# Patient Record
Sex: Male | Born: 1955 | Race: White | Hispanic: No | Marital: Married | Smoking: Former smoker
Health system: Southern US, Community
[De-identification: ages and names within clinical notes are randomized; demographics above are authoritative.]

## PROBLEM LIST (undated history)

## (undated) DIAGNOSIS — E669 Obesity, unspecified: Secondary | ICD-10-CM

## (undated) DIAGNOSIS — K449 Diaphragmatic hernia without obstruction or gangrene: Secondary | ICD-10-CM

## (undated) DIAGNOSIS — G5600 Carpal tunnel syndrome, unspecified upper limb: Secondary | ICD-10-CM

## (undated) DIAGNOSIS — N529 Male erectile dysfunction, unspecified: Secondary | ICD-10-CM

## (undated) DIAGNOSIS — E785 Hyperlipidemia, unspecified: Secondary | ICD-10-CM

## (undated) DIAGNOSIS — E119 Type 2 diabetes mellitus without complications: Secondary | ICD-10-CM

## (undated) DIAGNOSIS — I509 Heart failure, unspecified: Secondary | ICD-10-CM

## (undated) DIAGNOSIS — K635 Polyp of colon: Secondary | ICD-10-CM

## (undated) DIAGNOSIS — Z8669 Personal history of other diseases of the nervous system and sense organs: Secondary | ICD-10-CM

## (undated) DIAGNOSIS — F419 Anxiety disorder, unspecified: Secondary | ICD-10-CM

## (undated) DIAGNOSIS — G4730 Sleep apnea, unspecified: Secondary | ICD-10-CM

## (undated) DIAGNOSIS — T7840XA Allergy, unspecified, initial encounter: Secondary | ICD-10-CM

## (undated) DIAGNOSIS — F329 Major depressive disorder, single episode, unspecified: Secondary | ICD-10-CM

## (undated) DIAGNOSIS — H269 Unspecified cataract: Secondary | ICD-10-CM

## (undated) DIAGNOSIS — E079 Disorder of thyroid, unspecified: Secondary | ICD-10-CM

## (undated) DIAGNOSIS — I10 Essential (primary) hypertension: Secondary | ICD-10-CM

## (undated) DIAGNOSIS — K219 Gastro-esophageal reflux disease without esophagitis: Secondary | ICD-10-CM

## (undated) DIAGNOSIS — F32A Depression, unspecified: Secondary | ICD-10-CM

## (undated) HISTORY — PX: HAND SURGERY: SHX662

## (undated) HISTORY — DX: Gastro-esophageal reflux disease without esophagitis: K21.9

## (undated) HISTORY — DX: Hyperlipidemia, unspecified: E78.5

## (undated) HISTORY — DX: Obesity, unspecified: E66.9

## (undated) HISTORY — DX: Unspecified cataract: H26.9

## (undated) HISTORY — DX: Major depressive disorder, single episode, unspecified: F32.9

## (undated) HISTORY — DX: Sleep apnea, unspecified: G47.30

## (undated) HISTORY — PX: OTHER SURGICAL HISTORY: SHX169

## (undated) HISTORY — DX: Essential (primary) hypertension: I10

## (undated) HISTORY — DX: Allergy, unspecified, initial encounter: T78.40XA

## (undated) HISTORY — DX: Type 2 diabetes mellitus without complications: E11.9

## (undated) HISTORY — DX: Depression, unspecified: F32.A

## (undated) HISTORY — DX: Anxiety disorder, unspecified: F41.9

## (undated) HISTORY — DX: Disorder of thyroid, unspecified: E07.9

## (undated) HISTORY — DX: Carpal tunnel syndrome, unspecified upper limb: G56.00

## (undated) HISTORY — DX: Diaphragmatic hernia without obstruction or gangrene: K44.9

## (undated) HISTORY — DX: Male erectile dysfunction, unspecified: N52.9

## (undated) HISTORY — DX: Polyp of colon: K63.5

## (undated) HISTORY — DX: Personal history of other diseases of the nervous system and sense organs: Z86.69

---

## 1999-02-15 ENCOUNTER — Emergency Department (HOSPITAL_COMMUNITY): Admission: EM | Admit: 1999-02-15 | Discharge: 1999-02-15 | Payer: Self-pay

## 1999-02-16 ENCOUNTER — Inpatient Hospital Stay (HOSPITAL_COMMUNITY): Admission: EM | Admit: 1999-02-16 | Discharge: 1999-02-17 | Payer: Self-pay | Admitting: Psychiatry

## 1999-04-08 ENCOUNTER — Inpatient Hospital Stay (HOSPITAL_COMMUNITY): Admission: EM | Admit: 1999-04-08 | Discharge: 1999-04-10 | Payer: Self-pay | Admitting: Psychiatry

## 1999-11-05 ENCOUNTER — Encounter: Payer: Self-pay | Admitting: Emergency Medicine

## 1999-11-05 ENCOUNTER — Emergency Department (HOSPITAL_COMMUNITY): Admission: EM | Admit: 1999-11-05 | Discharge: 1999-11-05 | Payer: Self-pay

## 2000-08-18 ENCOUNTER — Emergency Department (HOSPITAL_COMMUNITY): Admission: EM | Admit: 2000-08-18 | Discharge: 2000-08-19 | Payer: Self-pay | Admitting: Emergency Medicine

## 2000-08-19 ENCOUNTER — Encounter: Payer: Self-pay | Admitting: Emergency Medicine

## 2000-08-20 ENCOUNTER — Ambulatory Visit (HOSPITAL_COMMUNITY): Admission: RE | Admit: 2000-08-20 | Discharge: 2000-08-20 | Payer: Self-pay | Admitting: Emergency Medicine

## 2000-08-20 ENCOUNTER — Encounter: Payer: Self-pay | Admitting: Emergency Medicine

## 2002-05-13 ENCOUNTER — Encounter: Admission: RE | Admit: 2002-05-13 | Discharge: 2002-08-11 | Payer: Self-pay | Admitting: Internal Medicine

## 2002-10-21 ENCOUNTER — Encounter: Admission: RE | Admit: 2002-10-21 | Discharge: 2003-01-19 | Payer: Self-pay | Admitting: Internal Medicine

## 2003-01-06 ENCOUNTER — Emergency Department (HOSPITAL_COMMUNITY): Admission: EM | Admit: 2003-01-06 | Discharge: 2003-01-06 | Payer: Self-pay | Admitting: Emergency Medicine

## 2006-02-04 ENCOUNTER — Ambulatory Visit: Payer: Self-pay | Admitting: Internal Medicine

## 2006-03-14 ENCOUNTER — Ambulatory Visit: Payer: Self-pay | Admitting: Internal Medicine

## 2007-05-26 ENCOUNTER — Encounter: Admission: RE | Admit: 2007-05-26 | Discharge: 2007-05-26 | Payer: Self-pay | Admitting: Internal Medicine

## 2008-08-19 ENCOUNTER — Emergency Department (HOSPITAL_COMMUNITY): Admission: EM | Admit: 2008-08-19 | Discharge: 2008-08-19 | Payer: Self-pay | Admitting: Emergency Medicine

## 2009-08-09 ENCOUNTER — Encounter: Payer: Self-pay | Admitting: Internal Medicine

## 2009-08-29 ENCOUNTER — Telehealth (INDEPENDENT_AMBULATORY_CARE_PROVIDER_SITE_OTHER): Payer: Self-pay | Admitting: *Deleted

## 2009-08-29 ENCOUNTER — Encounter: Payer: Self-pay | Admitting: Internal Medicine

## 2010-10-09 NOTE — Letter (Signed)
Summary: [facility]   Imported By: Sherian Rein 09/15/2009 13:57:24  _____________________________________________________________________  External Attachment:    Type:   Image     Comment:   External Document

## 2010-10-09 NOTE — Letter (Signed)
Summary: [facility]   Imported By: Lester Granite 09/04/2009 13:55:11  _____________________________________________________________________  External Attachment:    Type:   Image     Comment:   External Document

## 2010-10-09 NOTE — Progress Notes (Signed)
Summary: Record received  Records received from Dr. Wylene Simmer. Forwarded to Dr. Marina Goodell. Andrew Oconnor  August 29, 2009 2:55 PM

## 2011-04-10 ENCOUNTER — Encounter: Payer: Self-pay | Admitting: Internal Medicine

## 2011-06-14 LAB — GLUCOSE, CAPILLARY
Glucose-Capillary: 226 mg/dL — ABNORMAL HIGH (ref 70–99)
Glucose-Capillary: 55 mg/dL — ABNORMAL LOW (ref 70–99)

## 2012-04-17 ENCOUNTER — Other Ambulatory Visit: Payer: Self-pay | Admitting: Internal Medicine

## 2012-04-17 DIAGNOSIS — N63 Unspecified lump in unspecified breast: Secondary | ICD-10-CM

## 2012-04-23 ENCOUNTER — Ambulatory Visit
Admission: RE | Admit: 2012-04-23 | Discharge: 2012-04-23 | Disposition: A | Discharge status: Home / Self Care | Payer: Medicare Other | Source: Ambulatory Visit | Attending: Internal Medicine | Admitting: Internal Medicine

## 2012-04-23 DIAGNOSIS — N63 Unspecified lump in unspecified breast: Secondary | ICD-10-CM

## 2012-06-09 ENCOUNTER — Encounter: Payer: Self-pay | Admitting: Internal Medicine

## 2013-01-28 ENCOUNTER — Encounter: Payer: Self-pay | Admitting: Neurology

## 2013-01-29 ENCOUNTER — Other Ambulatory Visit: Payer: Self-pay | Admitting: *Deleted

## 2013-01-29 ENCOUNTER — Ambulatory Visit (INDEPENDENT_AMBULATORY_CARE_PROVIDER_SITE_OTHER): Payer: Medicare Other | Admitting: Neurology

## 2013-01-29 ENCOUNTER — Encounter: Payer: Self-pay | Admitting: Neurology

## 2013-01-29 VITALS — BP 135/73 | HR 90 | Temp 97.9°F | Ht <= 58 in | Wt 157.0 lb

## 2013-01-29 DIAGNOSIS — G4733 Obstructive sleep apnea (adult) (pediatric): Secondary | ICD-10-CM

## 2013-01-29 DIAGNOSIS — G4730 Sleep apnea, unspecified: Secondary | ICD-10-CM | POA: Insufficient documentation

## 2013-01-29 DIAGNOSIS — G4752 REM sleep behavior disorder: Secondary | ICD-10-CM

## 2013-01-29 MED ORDER — AMITRIPTYLINE HCL 10 MG PO TABS: 10.0000 mg | ORAL_TABLET | Freq: Every day | ORAL | Status: DC

## 2013-01-29 MED ORDER — CITALOPRAM HYDROBROMIDE 40 MG PO TABS: 20.0000 mg | ORAL_TABLET | Freq: Every day | ORAL | Status: DC

## 2013-01-29 NOTE — Addendum Note (Signed)
Addended by: Melvyn Novas on: 01/29/2013 09:36 AM   Modules accepted: Orders

## 2013-01-29 NOTE — Patient Instructions (Signed)

## 2013-01-29 NOTE — Progress Notes (Signed)
[facility]  Provider:  Dr Vickey Huger Referring Provider: Tisovec Primary Care Physician:  Gaspar Garbe, MD  Chief Complaint  Patient presents with  . Follow-up    ROS- memory loss, snoring, hearing loss, ringing in ears, joint pain, depression, sleepiness, restless leg,   . Insomnia    HPI:  Andrew Oconnor is a 57 y.o. male here as a referral from Dr. Wylene Simmer, for evaluation of sleep behaviors. 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 His baseline AHI at the time was 20 and he was titrated to 12 cm water in November 2010 his AHI was checked the last time on his CPAP machine and it was 3.3, a good result. The patient is considered compliant. Today Andrew Oconnor returns after 4 years stating that he has developed a sleep behavior that is concerning to him. He seems to act out brings but is not aware of his behaviors at night they occur in his sleep.   He may thrash about or even hit his wife accidentally dwindles activities and then seems to go back to sleep , not recalling the next morning what happened. His wife has noticed that his behavior occurs mostly after 3 AM and probably between 3 and 5 AM, which would correlate with REM sleep.  The patient is continuously using his CPAP which he brought here today but he states that he feels longer as refreshed as initially and his therapy was started. Andrew Oconnor goes to bed around 11:00 and with help of his medication namely Klonopin and falls asleep quickly in less than 15 minutes. He may have one bathroom break at night but that certainly does not endorse nocturia. He wakes normally upper one 6 AM before his alarm is that his wife leaving for work. He sometimes awakens wears swollen ankles and puffy hands. He has developed a slightly nasal voice with dysphonia. Here shortly that this is not seasonal or allergy related. Has no nasal breathing restrictions.     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 The patient is already taking Klonopin 3 mg at night and is on Celexa 40 mg daily, these medications can affect his sleep architecture greatly.  Klonopin would be the drug of choice for REM behavior disorder. He endorsed the Epworth sleepiness score at 11 points his original sleepiness score was 14 points on 04-26-2009 at the time, Andrew Oconnor was followed by  Dr. Darrow Bussing.  Review of Systems: Out of a complete 14 system review, the patient complains of only the following symptoms, and all other reviewed systems are negative. epworth 11 points , was 14 in 2010.   History   Social History  . Marital Status: Married    Spouse Name: N/A    Number of Children: N/A  . Years of Education: N/A   Occupational History  . Not on file.   Social History Main Topics  . Smoking status: Former Smoker    Quit date: 09/09/2002  . Smokeless tobacco: Never Used  . Alcohol Use: Yes     Comment: 12oz nightly  . Drug Use: No  . Sexually Active: Not on file   Other Topics Concern  . Not on file   Social History Narrative    may continue treatment of complex apnea with his current CPAP setting , good  residual AHI of 3.3/   Has improved by Epworth and has only once a night  nocturia , less shortness of breath.   gel mask fits well acc. to air leak data but has left a pressure mark.    He may need an alternative mask until it's healed. I recommended a swift FX medium pillows .   DME [employer] - machine , HCS with Alinda Money,  RT.          Family History  Problem Relation Age of Onset  . Heart disease Mother   . Heart disease Father   . Diabetes Father   . Stroke Father   . Seizures Father   . Cancer Sister   . Heart disease Sister   . Heart disease Brother   . Diabetes Brother   . Heart disease Maternal Grandmother   . Heart disease Maternal  Grandfather   . Heart disease Paternal Grandmother   . Heart disease Paternal Grandfather     Past Medical History  Diagnosis Date  . Thyroid disorder   . Depression   . Anxiety   . Diabetes mellitus without complication   . Hyperlipidemia   . Sleep apnea with use of continuous positive airway pressure (CPAP)     diagnsed AHi of 20 in 2010 , titrated to 13 cm H20.    History reviewed. No pertinent past surgical history.  Current Outpatient Prescriptions  Medication Sig Dispense Refill  . benazepril (LOTENSIN) 10 MG tablet       . citalopram (CELEXA) 40 MG tablet       . clonazePAM (KLONOPIN) 1 MG tablet 3 (three) times daily.       Marland Kitchen levothyroxine (SYNTHROID, LEVOTHROID) 200 MCG tablet       . NOVOLOG 100 UNIT/ML injection       . omeprazole (PRILOSEC) 20 MG capsule       . pravastatin (PRAVACHOL) 40 MG tablet       . spironolactone-hydrochlorothiazide (ALDACTAZIDE) 25-25 MG per tablet        No current facility-administered medications for this visit.    Allergies as of 01/29/2013  . (No Known Allergies)    Vitals: BP 135/73  Pulse 90  Temp(Src) 97.9 F (36.6 C) (Oral)  Ht 4\' 10"  (1.473 m)  Wt 157 lb (71.215 kg)  BMI 32.82 kg/m2 Last Weight:  Wt Readings from Last 1 Encounters:  01/29/13 157 lb (71.215 kg)   Last Height:   Ht Readings from Last 1 Encounters:  01/29/13 4\' 10"  (1.473 m)   Vision Screening:  Left eye with correction .  Right eye with correction as see vitals .  Physical exam:  General: The patient is awake, alert and appears not in acute distress. The patient is well groomed. 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 Mallampati grade 3 - uvula is not visible, tongue is  Large for jaw. He can breath through either nasion, no septal deviation ,  neck circumference:18. Inches  Cardiovascular:  Regular rate and rhythm, without  murmurs or carotid bruit, and without distended neck veins. Respiratory: Lungs  are clear to auscultation. Skin:  Without evidence of edema, or rash Trunk: BMI is elevated and patient  has normal posture. He of short built, walks erect without shuffling.   Neurologic exam : The patient is awake and alert, oriented to place and time.  Memory subjective  described as impaired( " short term is gone ") . There is a normal attention  span & concentration ability. Speech is fluent without  dysarthria,or aphasia. Evident is a hoarse , raspy voice.  Mood and affect are appropriate.  Cranial nerves: Pupils are equal and briskly reactive to light. Funduscopic exam without  evidence of pallor or edema. Extraocular movements  in vertical and horizontal planes intact and without nystagmus.  Visual fields by finger perimetry are intact. Hearing to finger rub intact.  Facial sensation intact to fine touch. Facial motor strength is symmetric and tongue  midline.  Motor exam:   General slightly elevated  tone and normal muscle bulk , symmetric  in all extremities. there is significant grip strength loss bilaterally.   Sensory:  Fine touch, pinprick and vibration were tested in all extremities.  There is numbness to the ankle  In both feet to fine touch, and described pin and needle dyseasthesia.  Proprioception is tested in the upper extremities only. This was  normal.  Coordination: Rapid alternating movements in the fingers/hands is tested and is abnormally slow  Finger-to-nose maneuver tested and normal without evidence of ataxia, dysmetria or tremor.  Gait and station: Patient walks without assistive device and is able unassisted to climb up to the exam table . Strength within normal limits. Stance is stable and normal.  Tandem gait is impaired he  Drifts every fourth step to the right,  , he  turns with 4 Steps- his Romberg testing is normal.  Deep tendon reflexes: in the  upper and lower extremities are symmetric, attenuated in patella and absent in achilles. and intact. Babinski  maneuver response is equivocal..   Assessment:  After physical and neurologic examination, review of laboratory studies, imaging, neurophysiology testing and pre-existing records, assessment will be reviewed on the problem list.  Plan:  Treatment plan and additional workup will be reviewed under Problem List.   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 It was able to interrogate his CPAP machine which is mean right over 23 years old. He has used the machine nightly was a compliance rate of 95% average daily usage of 6 hours 30 minutes, pressure set at 13 cm with a 3 cm EPI level, residual AHI is 2.9. He uses a machine by [employer] , a S9 Elite . It appears that the sleep apnea is optimally treated.  Assessment #2 REM behavior disorder, clinical description of onset time the briefness Ossa spells, and the patient's inability to recalls speaks for the sleep disorder. REM behavior disorder is highly correlated to lewy body disease and to Parkinson's disease. REM behavior disorder can proceed for his disorders by 7 years. The treatment of choice his Klonopin, of which the patient already takes 3 mg nightly. I would like to read to reduce  Celexa from 40 mg to 20 mg in AM , keep Klonopin at night and consider it to ex change the  SSRI for a tricyclic medication.  If he reports short-term memory loss, this is a more difficult decision. Please review the detailed my exam in reference to dysphonia, generalized tone elevation, slowing of rapid alternating movements. I was asked the patient's wife to bring up funerals next visit or perhaps accompany her husband so she can describe but she witnesses at night.  Education about apnea, central versus obstructive apnea, REM sleep behavior disorder, underlying pathophysiology, 45 minutes .

## 2013-02-04 ENCOUNTER — Encounter: Payer: Self-pay | Admitting: Neurology

## 2013-04-20 ENCOUNTER — Other Ambulatory Visit: Payer: Self-pay | Admitting: Internal Medicine

## 2013-04-20 DIAGNOSIS — R7989 Other specified abnormal findings of blood chemistry: Secondary | ICD-10-CM

## 2013-04-21 ENCOUNTER — Ambulatory Visit
Admission: RE | Admit: 2013-04-21 | Discharge: 2013-04-21 | Disposition: A | Discharge status: Home / Self Care | Payer: Medicare PPO | Source: Ambulatory Visit | Attending: Internal Medicine | Admitting: Internal Medicine

## 2013-04-21 DIAGNOSIS — R7989 Other specified abnormal findings of blood chemistry: Secondary | ICD-10-CM

## 2013-04-22 ENCOUNTER — Other Ambulatory Visit: Payer: Medicare Other

## 2013-04-30 ENCOUNTER — Ambulatory Visit: Payer: Self-pay | Admitting: Neurology

## 2013-05-05 ENCOUNTER — Ambulatory Visit: Payer: Medicare Other | Admitting: Neurology

## 2013-05-20 ENCOUNTER — Encounter: Payer: Self-pay | Admitting: Neurology

## 2013-05-20 ENCOUNTER — Ambulatory Visit: Payer: Self-pay | Admitting: Neurology

## 2013-05-22 ENCOUNTER — Other Ambulatory Visit: Payer: Self-pay | Admitting: Neurology

## 2013-07-15 ENCOUNTER — Other Ambulatory Visit: Payer: Self-pay

## 2013-07-19 ENCOUNTER — Encounter: Payer: Self-pay | Admitting: Internal Medicine

## 2013-08-20 ENCOUNTER — Other Ambulatory Visit: Payer: Self-pay | Admitting: Neurology

## 2013-08-23 ENCOUNTER — Telehealth: Payer: Self-pay

## 2013-08-23 ENCOUNTER — Ambulatory Visit (INDEPENDENT_AMBULATORY_CARE_PROVIDER_SITE_OTHER): Payer: Medicare PPO | Admitting: Internal Medicine

## 2013-08-23 ENCOUNTER — Encounter: Payer: Self-pay | Admitting: Internal Medicine

## 2013-08-23 ENCOUNTER — Other Ambulatory Visit (INDEPENDENT_AMBULATORY_CARE_PROVIDER_SITE_OTHER): Payer: Medicare PPO

## 2013-08-23 VITALS — BP 120/70 | HR 88 | Ht <= 58 in | Wt 161.2 lb

## 2013-08-23 DIAGNOSIS — Z86010 Personal history of colonic polyps: Secondary | ICD-10-CM

## 2013-08-23 DIAGNOSIS — K7689 Other specified diseases of liver: Secondary | ICD-10-CM

## 2013-08-23 DIAGNOSIS — K760 Fatty (change of) liver, not elsewhere classified: Secondary | ICD-10-CM

## 2013-08-23 DIAGNOSIS — R932 Abnormal findings on diagnostic imaging of liver and biliary tract: Secondary | ICD-10-CM

## 2013-08-23 DIAGNOSIS — R7402 Elevation of levels of lactic acid dehydrogenase (LDH): Secondary | ICD-10-CM

## 2013-08-23 LAB — CBC WITH DIFFERENTIAL/PLATELET
Basophils Absolute: 0.1 10*3/uL (ref 0.0–0.1)
Basophils Relative: 1.1 % (ref 0.0–3.0)
Eosinophils Absolute: 0.4 10*3/uL (ref 0.0–0.7)
Eosinophils Relative: 3.8 % (ref 0.0–5.0)
HCT: 45.9 % (ref 39.0–52.0)
Hemoglobin: 16.3 g/dL (ref 13.0–17.0)
Lymphocytes Relative: 26.5 % (ref 12.0–46.0)
Lymphs Abs: 2.5 10*3/uL (ref 0.7–4.0)
MCHC: 35.4 g/dL (ref 30.0–36.0)
MCV: 91.4 fl (ref 78.0–100.0)
Monocytes Absolute: 0.7 10*3/uL (ref 0.1–1.0)
Monocytes Relative: 7.3 % (ref 3.0–12.0)
Neutro Abs: 5.9 10*3/uL (ref 1.4–7.7)
Neutrophils Relative %: 61.3 % (ref 43.0–77.0)
Platelets: 241 10*3/uL (ref 150.0–400.0)
RBC: 5.02 Mil/uL (ref 4.22–5.81)
RDW: 13.4 % (ref 11.5–14.6)
WBC: 9.6 10*3/uL (ref 4.5–10.5)

## 2013-08-23 LAB — HEPATIC FUNCTION PANEL
ALT: 65 U/L — ABNORMAL HIGH (ref 0–53)
AST: 39 U/L — ABNORMAL HIGH (ref 0–37)
Albumin: 3.9 g/dL (ref 3.5–5.2)
Alkaline Phosphatase: 111 U/L (ref 39–117)
Bilirubin, Direct: 0.1 mg/dL (ref 0.0–0.3)
Total Bilirubin: 1.2 mg/dL (ref 0.3–1.2)
Total Protein: 6.8 g/dL (ref 6.0–8.3)

## 2013-08-23 LAB — BASIC METABOLIC PANEL
BUN: 13 mg/dL (ref 6–23)
CO2: 32 mEq/L (ref 19–32)
Calcium: 8.8 mg/dL (ref 8.4–10.5)
Chloride: 99 mEq/L (ref 96–112)
Creatinine, Ser: 1 mg/dL (ref 0.4–1.5)
GFR: 85.44 mL/min (ref >60.00)
Glucose, Bld: 292 mg/dL — ABNORMAL HIGH (ref 70–99)
Potassium: 4.6 mEq/L (ref 3.5–5.1)
Sodium: 137 mEq/L (ref 135–145)

## 2013-08-23 LAB — IBC PANEL
Iron: 111 ug/dL (ref 42–165)
Saturation Ratios: 33.7 % (ref 20.0–50.0)
Transferrin: 235.2 mg/dL (ref 212.0–360.0)

## 2013-08-23 LAB — FERRITIN: Ferritin: 134.3 ng/mL (ref 22.0–322.0)

## 2013-08-23 LAB — PROTIME-INR
INR: 1 ratio (ref 0.8–1.0)
Prothrombin Time: 10.6 s (ref 10.2–12.4)

## 2013-08-23 MED ORDER — MOVIPREP 100 G PO SOLR: 1.0000 | Freq: Once | ORAL | Status: DC

## 2013-08-23 NOTE — Telephone Encounter (Signed)
  Dear Dr. :Durwin Reges, MD has scheduled the above patient for a colonoscopy and endoscopy at 10:30am on 09/08/2013.  Our records show that he/she is on insulin therapy via an insulin pump.  Our colonoscopy prep protocol requires that:  the patient must be on a clear liquid diet the entire day prior to the procedure date as well as the morning of the procedure  the patient must be NPO for 2 hours prior to the procedure   the patient must consume a PEG 3350 solution to prepare for the procedure.  Please advise [place] of any adjustments that need to be made to the patient's insulin pump therapy prior to the above procedure date.    Please route or fax back this completed form to me at [phone] .  If you have any question, please call me at [phone].  Thank you for your help with this matter.

## 2013-08-23 NOTE — Patient Instructions (Signed)
Your physician has requested that you go to the basement for lab work before leaving today   You have been scheduled for a colonoscopy with propofol. Please follow written instructions given to you at your visit today.  Please pick up your prep kit at the pharmacy within the next 1-3 days.  If you use inhalers (even only as needed), please bring them with you on the day of your procedure.  Your physician has requested that you go to www.startemmi.com and enter the access code given to you at your visit today. This web site gives a general overview about your procedure. However, you should still follow specific instructions given to you by our office regarding your preparation for the procedure.  

## 2013-08-23 NOTE — Progress Notes (Signed)
HISTORY OF PRESENT ILLNESS:  Andrew Oconnor is a 57 y.o. male with multiple medical problems as listed below, including long-standing insulin requiring diabetes mellitus and obesity. He presents today regarding elevated LFTs and abnormal abdominal imaging. As well, the need for surveillance colonoscopy. Patient reports first being told that he had elevated liver tests about 6 months ago. Review of outside laboratory records shows liver tests from July, August, and November 2014. AST levels ranged from 53-115, ALT from 102-215, alkaline phosphatase from 158-195, and total bilirubin from 0.4-1.9. Protein, albumin, and globulin levels have been normal. CBC was unremarkable including MCV and platelets. Normal TSH. Hemoglobin A1c 7.1. Abdominal ultrasound performed 04/21/2013 revealed a very echogenic liver consistent with fatty infiltration. Incidental small cyst. The patient reports consuming 8 ounces of alcohol every other night. He denies a family history of liver disease or personal history of liver problems or hepatitis. He denies any new medications. He has had a 10 pound weight gain over the past year. He also has a history of GERD for which she takes omeprazole 20 mg daily. Good control of reflux symptoms on this medication. Upper endoscopy July 2007 was normal except for a hiatal hernia. He also underwent routine screening colonoscopy July 2007. Small tubular adenoma removed. Moderate diverticulosis present. Followup in 5 years recommended. He is overdue  REVIEW OF SYSTEMS:  All non-GI ROS negative except for anxiety, source of breath, sleeping problems,  Past Medical History  Diagnosis Date  . Thyroid disorder   . Depression   . Anxiety   . Diabetes mellitus without complication   . Hyperlipidemia   . Sleep apnea with use of continuous positive airway pressure (CPAP)     diagnsed AHi of 20 in 2010 , titrated to 13 cm H20.  Marland Kitchen History of diplopia      third nerve palsy 2013   . Hypertension    . GERD (gastroesophageal reflux disease)   . Hiatal hernia   . Carpal tunnel syndrome   . Obesity   . Colon polyp   . Erectile dysfunction     Past Surgical History  Procedure Laterality Date  . Hemorrectomy      Social History Andrew Oconnor  reports that he quit smoking about 10 years ago. He has never used smokeless tobacco. He reports that he drinks alcohol. He reports that he does not use illicit drugs.  family history includes Cancer in his sister; Diabetes in his brother and father; Heart disease in his brother, father, maternal grandfather, maternal grandmother, mother, paternal grandfather, paternal grandmother, and sister; Seizures in his father; Stroke in his father and other.  No Known Allergies     PHYSICAL EXAMINATION: Vital signs: BP 120/70  Pulse 88  Ht 4\' 9"  (1.448 m)  Wt 161 lb 4 oz (73.143 kg)  BMI 34.88 kg/m2  SpO2 98%  Constitutional: generally well-appearing, no acute distress Psychiatric: alert and oriented x3, cooperative Eyes: extraocular movements intact, anicteric, conjunctiva pink Mouth: oral pharynx moist, no lesions Neck: supple no lymphadenopathy Cardiovascular: heart regular rate and rhythm, no murmur Lungs: clear to auscultation bilaterally Abdomen: soft, obese, nontender, nondistended, no obvious ascites, no peritoneal signs, normal bowel sounds, no organomegaly Rectal: Deferred until colonoscopy Extremities: no lower extremity edema bilaterally Skin: no lesions on visible extremities Neuro: No focal deficits. No asterixis.    ASSESSMENT:  #1. Chronically elevated hepatic transaminases. May be related to fatty liver. Risk factors include diabetes mellitus, obesity, and chronic alcohol use. #2. Fatty liver on  ultrasound #3. History of adenomatous colon polyps 2007. Overdue for followup #4. GERD with negative endoscopy. Asymptomatic on PPI #5. Multiple medical problems, including insulin requiring diabetes. On insulin  pump   PLAN:  #1. Viral and non-viral laboratory studies to evaluate chronically elevated liver tests. As well, PT/INR to evaluate synthetic function #2. More than likely will recommend weight loss and discontinuation of alcohol #3. Surveillance colonoscopy.The nature of the procedure, as well as the risks, benefits, and alternatives were carefully and thoroughly reviewed with the patient. Ample time for discussion and questions allowed. The patient understood, was satisfied, and agreed to proceed. The patient is high-risk given his multiple medical problems, insulin requirement, and body habitus. Movi prep prescribed. The patient instructed on its use. Also, we will consult with his primary provider regarding adjustment of his insulin pump to avoid and wanted hypoglycemia. #4. Continue PPI and reflux precautions

## 2013-08-24 ENCOUNTER — Encounter: Payer: Self-pay | Admitting: Internal Medicine

## 2013-08-24 LAB — HEPATITIS B SURFACE ANTIGEN: Hepatitis B Surface Ag: NEGATIVE

## 2013-08-24 LAB — HEPATITIS C ANTIBODY: HCV Ab: NEGATIVE

## 2013-08-24 LAB — HEPATITIS B SURFACE ANTIBODY,QUALITATIVE: Hep B S Ab: NEGATIVE

## 2013-08-24 LAB — ANA: Anti Nuclear Antibody(ANA): NEGATIVE

## 2013-08-25 LAB — ANTI-SMOOTH MUSCLE ANTIBODY, IGG: Smooth Muscle Ab: 5 U (ref <20)

## 2013-08-25 LAB — MITOCHONDRIAL ANTIBODIES: Mitochondrial M2 Ab, IgG: 0.26 (ref <0.91)

## 2013-08-25 LAB — GLIA (IGA/G) + TTG IGA
Gliadin IgA: 3.2 U/mL (ref <20)
Gliadin IgG: 23 U/mL — ABNORMAL HIGH (ref <20)
Tissue Transglutaminase Ab, IgA: 3.1 U/mL (ref <20)

## 2013-08-25 LAB — ALPHA-1-ANTITRYPSIN: A-1 Antitrypsin, Ser: 158 mg/dL (ref 90–200)

## 2013-08-25 LAB — CERULOPLASMIN: Ceruloplasmin: 29 mg/dL (ref 20–60)

## 2013-08-27 ENCOUNTER — Telehealth: Payer: Self-pay

## 2013-08-27 NOTE — Telephone Encounter (Signed)
Faxed previously routed Insulin letter to Dr. Wylene Simmer asking for how to handle patien'ts insulin before his procedure. Awaiting response

## 2013-09-01 NOTE — Telephone Encounter (Signed)
Received a fax from Dr. Deneen Harts office regarding pt's insulin pump before his procedure.  I called Mr. Andrew Oconnor and verified that he had also heard from their office and understood the instructions to cut back to 50% at 6am, check sugar q 6 hours and then adjust it back to normal with next meal. Patient acknowledged and understood what he needed to do with his pump.

## 2013-09-08 ENCOUNTER — Encounter: Payer: Self-pay | Admitting: Internal Medicine

## 2013-09-08 ENCOUNTER — Ambulatory Visit (AMBULATORY_SURGERY_CENTER): Payer: Medicare PPO | Admitting: Internal Medicine

## 2013-09-08 VITALS — BP 147/82 | HR 76 | Temp 97.0°F | Resp 13 | Ht <= 58 in | Wt 161.0 lb

## 2013-09-08 DIAGNOSIS — Z86010 Personal history of colonic polyps: Secondary | ICD-10-CM

## 2013-09-08 DIAGNOSIS — D126 Benign neoplasm of colon, unspecified: Secondary | ICD-10-CM

## 2013-09-08 LAB — GLUCOSE, CAPILLARY
Glucose-Capillary: 158 mg/dL — ABNORMAL HIGH (ref 70–99)
Glucose-Capillary: 157 mg/dL — ABNORMAL HIGH (ref 70–99)

## 2013-09-08 MED ORDER — SODIUM CHLORIDE 0.9 % IV SOLN: 500.0000 mL | INTRAVENOUS | Status: DC

## 2013-09-08 NOTE — Progress Notes (Signed)
Called to room to assist during endoscopic procedure.  Patient ID and intended procedure confirmed with present staff. Received instructions for my participation in the procedure from the performing physician.  

## 2013-09-08 NOTE — Patient Instructions (Signed)
YOU HAD AN ENDOSCOPIC PROCEDURE TODAY AT THE [facility]: Refer to the procedure report that was given to you for any specific questions about what was found during the examination.  If the procedure report does not answer your questions, please call your gastroenterologist to clarify.  If you requested that your care partner not be given the details of your procedure findings, then the procedure report has been included in a sealed envelope for you to review at your convenience later.  YOU SHOULD EXPECT: Some feelings of bloating in the abdomen. Passage of more gas than usual.  Walking can help get rid of the air that was put into your GI tract during the procedure and reduce the bloating. If you had a lower endoscopy (such as a colonoscopy or flexible sigmoidoscopy) you may notice spotting of blood in your stool or on the toilet paper. If you underwent a bowel prep for your procedure, then you may not have a normal bowel movement for a few days.  DIET: Your first meal following the procedure should be a light meal and then it is ok to progress to your normal diet.  A half-sandwich or bowl of soup is an example of a good first meal.  Heavy or fried foods are harder to digest and may make you feel nauseous or bloated.  Likewise meals heavy in dairy and vegetables can cause extra gas to form and this can also increase the bloating.  Drink plenty of fluids but you should avoid alcoholic beverages for 24 hours.  ACTIVITY: Your care partner should take you home directly after the procedure.  You should plan to take it easy, moving slowly for the rest of the day.  You can resume normal activity the day after the procedure however you should NOT DRIVE or use heavy machinery for 24 hours (because of the sedation medicines used during the test).    SYMPTOMS TO REPORT IMMEDIATELY: A gastroenterologist can be reached at any hour.  During normal business hours, 8:30 AM to 5:00 PM Monday through Friday,  call [phone].  After hours and on weekends, please call the GI answering service at [phone]  Emergency number who will take a message and have the physician on call contact you.   Following lower endoscopy (colonoscopy or flexible sigmoidoscopy):  Excessive amounts of blood in the stool  Significant tenderness or worsening of abdominal pains  Swelling of the abdomen that is new, acute  Fever of 100F or higher  FOLLOW UP: If any biopsies were taken you will be contacted by phone or by letter within the next 1-3 weeks.  Call your gastroenterologist if you have not heard about the biopsies in 3 weeks.  Our staff will call the home number listed on your records the next business day following your procedure to check on you and address any questions or concerns that you may have at that time regarding the information given to you following your procedure. This is a courtesy call and so if there is no answer at the home number and we have not heard from you through the emergency physician on call, we will assume that you have returned to your regular daily activities without incident.  SIGNATURES/CONFIDENTIALITY: You and/or your care partner have signed paperwork which will be entered into your electronic medical record.  These signatures attest to the fact that that the information above on your After Visit Summary has been reviewed and is understood.  Full responsibility of the   confidentiality of this discharge information lies with you and/or your care-partner.  Handout on polyps diverticulosis high fiber diet  Follow up with dr Marina Goodell in 6 months regarding liver. You may call [phone] to schedule this appointment  Weight loss, exercise and reducing alcohol intake as discussed with dr Marina Goodell

## 2013-09-08 NOTE — Progress Notes (Signed)
Patient did not experience any of the following events: a burn prior to discharge; a fall within the facility; wrong site/side/patient/procedure/implant event; or a hospital transfer or hospital admission upon discharge from the facility. (G8907) Patient did not have preoperative order for IV antibiotic SSI prophylaxis. (G8918)  

## 2013-09-08 NOTE — Op Note (Signed)
[facility] [address] [place], [address]   COLONOSCOPY PROCEDURE REPORT  PATIENT: Andrew Oconnor, Andrew Oconnor  MR#: [id number] BIRTHDATE: Aug 29, 1956 , 57  yrs. old GENDER: Male ENDOSCOPIST: Roxy Cedar, MD REFERRED [id number] Program Recall PROCEDURE DATE:  09/08/2013 PROCEDURE:   Colonoscopy with snare polypectomy x1 First Screening Colonoscopy - Avg.  risk and is 50 yrs.  old or older - No.  Prior Negative Screening - Now for repeat screening. N/A  History of Adenoma - Now for follow-up colonoscopy & has been > or = to 3 yrs.  Yes hx of adenoma.  Has been 3 or more years since last colonoscopy.  Polyps Removed Today? Yes. ASA CLASS:   Class II INDICATIONS:Patient's personal history of adenomatous colon polyps. Index 03-2006 w/ small TA MEDICATIONS: MAC sedation, administered by CRNA and propofol (Diprivan) 250mg  IV DESCRIPTION OF PROCEDURE:   After the risks benefits and alternatives of the procedure were thoroughly explained, informed consent was obtained.  A digital rectal exam revealed no abnormalities of the rectum.   The LB [id number]  endoscope was introduced through the anus and advanced to the cecum, which was identified by both the appendix and ileocecal valve. No adverse events experienced.   The quality of the prep was good, using MoviPrep  The instrument was then slowly withdrawn as the colon was fully examined.  COLON FINDINGS: A diminutive polyp was found in the descending colon.  A polypectomy was performed with a cold snare.  The resection was complete and the polyp tissue was completely retrieved.   Moderate diverticulosis was noted The finding was in the left colon.   The colon mucosa was otherwise normal. Retroflexed views revealed internal hemorrhoids. The time to cecum=3 minutes 18 seconds.  Withdrawal time=12 minutes 28 seconds. The scope was withdrawn and the procedure completed. COMPLICATIONS: There were no  complications.  ENDOSCOPIC IMPRESSION: 1.   Diminutive polyp was found in the descending colon; polypectomy was performed with a cold snare 2.   Moderate diverticulosis was noted in the left colon 3.   The colon mucosa was otherwise normal  RECOMMENDATIONS: 1.Repeat colonoscopy in 5 years if polyp adenomatous; otherwise 10 years 2. Weight loss, exercise, reduce alcohol intake (as we discussed). Office follow up with Dr. Marina Goodell regarding liver in 6 months   eSigned:  Roxy Cedar, MD 09/08/2013 11:19 AM   cc: Guerry Bruin, MD and The Patient

## 2013-09-10 ENCOUNTER — Telehealth: Payer: Self-pay

## 2013-09-10 NOTE — Telephone Encounter (Signed)
  Follow up Call-  Call back number 09/08/2013  Post procedure Call Back phone  # [phone]  Permission to leave phone message Yes     Patient questions:  Do you have a fever, pain , or abdominal swelling? no Pain Score  0 *  Have you tolerated food without any problems? yes  Have you been able to return to your normal activities? yes  Do you have any questions about your discharge instructions: Diet   no Medications  no Follow up visit  no  Do you have questions or concerns about your Care? no  Actions: * If pain score is 4 or above: No action needed, pain <4.

## 2013-09-13 ENCOUNTER — Encounter: Payer: Self-pay | Admitting: Internal Medicine

## 2013-11-29 ENCOUNTER — Other Ambulatory Visit: Payer: Self-pay | Admitting: Neurology

## 2013-12-06 ENCOUNTER — Encounter: Payer: Self-pay | Admitting: Internal Medicine

## 2014-02-15 ENCOUNTER — Other Ambulatory Visit: Payer: Self-pay | Admitting: Neurology

## 2014-02-24 ENCOUNTER — Other Ambulatory Visit: Payer: Self-pay | Admitting: Neurology

## 2014-03-27 ENCOUNTER — Other Ambulatory Visit: Payer: Self-pay | Admitting: Neurology

## 2014-03-29 ENCOUNTER — Telehealth: Payer: Self-pay | Admitting: Neurology

## 2014-03-29 NOTE — Telephone Encounter (Signed)
Patient has not been seen in over 1 year and cancelled his last appt.  I called back.  Got no answer.  Left message.

## 2014-03-29 NOTE — Telephone Encounter (Signed)
Patient requesting Rx refill for amitriptyline (ELAVIL) 10 MG tablet.  Please fax to new pharmacy [facility].  Contact # for [facility] [phone].  Thanks

## 2014-04-14 NOTE — Telephone Encounter (Signed)
Noted  

## 2014-06-02 ENCOUNTER — Ambulatory Visit (INDEPENDENT_AMBULATORY_CARE_PROVIDER_SITE_OTHER): Payer: Medicare PPO | Admitting: Neurology

## 2014-06-02 ENCOUNTER — Encounter: Payer: Self-pay | Admitting: Neurology

## 2014-06-02 VITALS — BP 123/72 | HR 88 | Resp 16 | Ht 58.5 in | Wt 154.0 lb

## 2014-06-02 DIAGNOSIS — G4752 REM sleep behavior disorder: Secondary | ICD-10-CM

## 2014-06-02 MED ORDER — MELATONIN 5 MG PO TBDP: 5.0000 mg | ORAL_TABLET | Freq: Every evening | ORAL | Status: DC

## 2014-06-02 MED ORDER — AMITRIPTYLINE HCL 10 MG PO TABS: 10.0000 mg | ORAL_TABLET | Freq: Every evening | ORAL | Status: DC | PRN

## 2014-06-02 MED ORDER — CLONAZEPAM 1 MG PO TABS: 1.5000 mg | ORAL_TABLET | Freq: Every day | ORAL | Status: DC

## 2014-06-02 NOTE — Patient Instructions (Signed)
Sleep Apnea  Sleep apnea is a sleep disorder characterized by abnormal pauses in breathing while you sleep. When your breathing pauses, the level of oxygen in your blood decreases. This causes you to move out of deep sleep and into light sleep. As a result, your quality of sleep is poor, and the system that carries your blood throughout your body (cardiovascular system) experiences stress. If sleep apnea remains untreated, the following conditions can develop:  High blood pressure (hypertension).  Coronary artery disease.  Inability to achieve or maintain an erection (impotence).  Impairment of your thought process (cognitive dysfunction). There are three types of sleep apnea: 1. Obstructive sleep apnea--Pauses in breathing during sleep because of a blocked airway. 2. Central sleep apnea--Pauses in breathing during sleep because the area of the brain that controls your breathing does not send the correct signals to the muscles that control breathing. 3. Mixed sleep apnea--A combination of both obstructive and central sleep apnea. RISK FACTORS The following risk factors can increase your risk of developing sleep apnea:  Being overweight.  Smoking.  Having narrow passages in your nose and throat.  Being of older age.  Being male.  Alcohol use.  Sedative and tranquilizer use.  Ethnicity. Among individuals younger than 35 years, African Americans are at increased risk of sleep apnea. SYMPTOMS   Difficulty staying asleep.  Daytime sleepiness and fatigue.  Loss of energy.  Irritability.  Loud, heavy snoring.  Morning headaches.  Trouble concentrating.  Forgetfulness.  Decreased interest in sex. DIAGNOSIS  In order to diagnose sleep apnea, your caregiver will perform a physical examination. Your caregiver may suggest that you take a home sleep test. Your caregiver may also recommend that you spend the night in a sleep lab. In the sleep lab, several monitors record  information about your heart, lungs, and brain while you sleep. Your leg and arm movements and blood oxygen level are also recorded. TREATMENT The following actions may help to resolve mild sleep apnea:  Sleeping on your side.   Using a decongestant if you have nasal congestion.   Avoiding the use of depressants, including alcohol, sedatives, and narcotics.   Losing weight and modifying your diet if you are overweight. There also are devices and treatments to help open your airway:  Oral appliances. These are custom-made mouthpieces that shift your lower jaw forward and slightly open your bite. This opens your airway.  Devices that create positive airway pressure. This positive pressure "splints" your airway open to help you breathe better during sleep. The following devices create positive airway pressure:  Continuous positive airway pressure (CPAP) device. The CPAP device creates a continuous level of air pressure with an air pump. The air is delivered to your airway through a mask while you sleep. This continuous pressure keeps your airway open.  Nasal expiratory positive airway pressure (EPAP) device. The EPAP device creates positive air pressure as you exhale. The device consists of single-use valves, which are inserted into each nostril and held in place by adhesive. The valves create very little resistance when you inhale but create much more resistance when you exhale. That increased resistance creates the positive airway pressure. This positive pressure while you exhale keeps your airway open, making it easier to breath when you inhale again.  Bilevel positive airway pressure (BPAP) device. The BPAP device is used mainly in patients with central sleep apnea. This device is similar to the CPAP device because it also uses an air pump to deliver continuous air pressure   through a mask. However, with the BPAP machine, the pressure is set at two different levels. The pressure when you  exhale is lower than the pressure when you inhale.  Surgery. Typically, surgery is only done if you cannot comply with less invasive treatments or if the less invasive treatments do not improve your condition. Surgery involves removing excess tissue in your airway to create a wider passage way. Document Released: 08/16/2002 Document Revised: 12/21/2012 Document Reviewed: 01/02/2012 ExitCare Patient Information 2015 [employer]. This information is not intended to replace advice given to you by your health care provider. Make sure you discuss any questions you have with your health care provider.  

## 2014-06-02 NOTE — Progress Notes (Signed)
[facility] SLEEP CLINIC NOTE  Provider:  Dr Andrew Oconnor Referring Provider: Tisovec Primary Care Physician:  Andrew Pao, MD  Chief Complaint  Patient presents with  . Follow-up    Room 10  . sleep consult    HPI:  Andrew Oconnor is a 58 y.o. male here as a referral from Dr. Osborne Oconnor, for evaluation of sleep behaviors.   Andrew Oconnor reports that he still  Acts out dreams in spite of being treated with Klonopin. Sometimes his rashes he may take or even punch.the Klonopin has not affected him negatively. He has not fallen, stumbled or had enuresis.  We may add Melatonin at night . Without Klonopin he has violent jerking movements and even injured himself . He was jerky and twitching in daytime, too. This was a rebound effect while he ran out of medication.      Sleep apnea is sufficiently treated. A download obtained in office today 06-01-14 document of a set pressure of 13 cm water, EPI 3 cm water and an AHI of 3.0.  There is a moderate to high air leak. The average time D. and CPAP therapy at 7 hours and 24 minutes.  The patient's compliance is 94% for over 4 hours of use, which is excellent.        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 His baseline AHI at the time was 20 and he was titrated to 12 cm water. In November 2010 his AHI was checked the last time on his CPAP machine at  3.3, a good result.  The patient is considered compliant. Today Andrew Oconnor returns after 4 years,  stating that he has developed a sleep behavior that is concerning to him.  He seems to act out dreams, kicking and punching,  is not aware of his behaviors, which occur in his sleep.   He may thrash about or even hit his wife accidentally and then seems to go back to sleep. His wife has noticed that his behavior occurs mostly after 3 AM and probably between 3 and 5 AM, which would  correlate with REM sleep. The patient is continuously using his CPAP which he brought here today but he states that he feels longer as refreshed as initially and his therapy was started. Andrew Oconnor goes to bed around 11:00 and with help of his medication namely Klonopin and falls asleep quickly in less than 15 minutes. He may have one bathroom break at night but that certainly does not endorse nocturia. He wakes normally upper one 6 AM before his alarm is that his wife leaving for work. He sometimes awakens wears swollen ankles and puffy hands. He has developed a slightly nasal voice with dysphonia. Here shortly that this is not seasonal or allergy related. Has no nasal breathing restrictions.    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  The patient is already taking Klonopin 3 mg at night and is on Celexa 40 mg daily, these medications can affect his sleep architecture greatly.  Klonopin would be the drug of choice for REM behavior disorder. He endorsed the Epworth sleepiness score at 11 points his original sleepiness score was 14 points on 04-26-2009 at the time, Andrew Oconnor was followed by  Andrew Oconnor.  Review of Systems:  Out of a complete 14 system review, the patient complains of only the following symptoms, and all other reviewed systems are negative. epworth 11 points , was 14 in 2010.   History   Social History  . Marital Status: Married    Spouse Name: Andrew Oconnor    Number of Children: 0  . Years of Education: 12   Occupational History  . retired     Water quality scientist   Social History Main Topics  . Smoking status: Former Smoker    Quit date: 09/09/2002  . Smokeless tobacco: Never Used     Comment: quit smoking 2004  . Alcohol Use: Yes     Comment: occas.  . Drug Use: No  . Sexual Activity: Not on file   Other Topics Concern  . Not on file   Social  History Narrative    may continue treatment of complex apnea with his current CPAP setting , good residual AHI of 3.3/   Has improved by Epworth and has only once a night  nocturia , less shortness of breath.   gel mask fits well acc. to air leak data but has left a pressure mark.    He may need an alternative mask until it's healed. I recommended a swift FX medium pillows .   DME [employer] - machine , HCS with Andrew Oconnor,  RT.      Patient is married Andrew Oconnor) and lives at home with his wife and step-son.   Patient is retired.   Patient has a high school.   Patient is right-handed.   Patient drinks 1-2 cups of coffee every morning.             Family History  Problem Relation Age of Onset  . Heart disease Mother   . Heart disease Father   . Diabetes Father   . Stroke Father   . Seizures Father   . Cancer Sister   . Heart disease Sister   . Heart disease Brother   . Diabetes Brother   . Heart disease Maternal Grandmother   . Heart disease Maternal Grandfather   . Heart disease Paternal Grandmother   . Heart disease Paternal Grandfather   . Stroke Other     Past Medical History  Diagnosis Date  . Thyroid disorder   . Depression   . Anxiety   . Diabetes mellitus without complication   . Hyperlipidemia   . Sleep apnea with use of continuous positive airway pressure (CPAP)     diagnsed AHi of 20 in 2010 , titrated to 13 cm H20.  Marland Kitchen History of diplopia      third nerve palsy 2013   . Hypertension   . GERD (gastroesophageal reflux disease)   . Hiatal hernia   . Carpal tunnel syndrome   . Obesity   . Colon polyp   . Erectile dysfunction     Past Surgical History  Procedure Laterality Date  . Hemorrectomy      Current Outpatient Prescriptions  Medication Sig Dispense Refill  . amitriptyline (ELAVIL) 10 MG tablet Take 1 tablet (10 mg total) by mouth at bedtime as needed for sleep.  90 tablet  3  . benazepril (LOTENSIN) 10 MG tablet       . Blood Glucose Calibration (OT  ULTRA/FASTTK CNTRL SOLN) SOLN       . citalopram (CELEXA) 40 MG tablet TAKE ONE HALF TABLET BY MOUTH EVERY DAY  15 tablet  0  . clonazePAM (KLONOPIN) 1 MG tablet  Take 1.5 tablets (1.5 mg total) by mouth at bedtime.  45 tablet  5  . levothyroxine (SYNTHROID, LEVOTHROID) 200 MCG tablet       . NOVOLOG 100 UNIT/ML injection       . omeprazole (PRILOSEC) 20 MG capsule       . ONE TOUCH ULTRA TEST test strip       . pravastatin (PRAVACHOL) 40 MG tablet       . spironolactone-hydrochlorothiazide (ALDACTAZIDE) 25-25 MG per tablet       . Melatonin 5 MG TBDP Take 5 mg by mouth Nightly.  30 tablet  11   No current facility-administered medications for this visit.    Allergies as of 06/02/2014  . (No Known Allergies)    Vitals: BP 123/72  Pulse 88  Resp 16  Ht 4' 10.5" (1.486 m)  Wt 154 lb (69.854 kg)  BMI 31.63 kg/m2 Last Weight:  Wt Readings from Last 1 Encounters:  06/02/14 154 lb (69.854 kg)   Last Height:   Ht Readings from Last 1 Encounters:  06/02/14 4' 10.5" (1.486 m)   Vision Screening:  Left eye with correction .  Right eye with correction as see vitals . FSS 36, Epworth 11 points.   Physical exam:  General: The patient is awake, alert and appears not in acute distress. The patient is well groomed. 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 Mallampati grade 3 - uvula is not visible, tongue is large for jaw.  He can breath through either nasion, no septal deviation ,  neck circumference: large 18 inches  Cardiovascular:  Regular rate and rhythm, without  murmurs or carotid bruit, and without distended neck veins. Respiratory: Lungs are clear to auscultation. Skin:  Without evidence of edema, or rash Trunk: BMI is elevated and patient  has normal posture.  He of short built, walks erect without shuffling.   Neurologic exam : The patient is awake and alert, oriented to place and time.   Memory subjective described as impaired( "  short term is gone ") . MMSE documented objective deficits-  Calculation, fluency and recall. Clock draw 4/4 , 25-30- mild cognitive impairment.    There is a normal attention span & concentration ability. Speech is fluent without  dysarthria,or aphasia. Evident is a hoarse, raspy voice.   Mood and affect are appropriate.  Cranial nerves: Pupils are equal and briskly reactive to light. Funduscopic exam without evidence of pallor or edema. Full extraocular movements in vertical and horizontal planes intact and without nystagmus.  Visual fields by finger perimetry are intact. Hearing to finger rub intact.  Facial sensation intact to fine touch. Facial motor strength is symmetric and tongue midline.  Motor exam:   General slightly elevated  tone and normal muscle bulk , symmetric  in all extremities. there is significant grip strength loss bilaterally.   Sensory:  Fine touch, pinprick and vibration were tested in all extremities.  There is numbness to the ankle  In both feet to fine touch, and described pin and needle dysesthesia.  Proprioception is tested in the upper extremities only. This was  normal.  Coordination: Rapid alternating movements in the fingers/hands is tested and is abnormally slow  Finger-to-nose maneuver tested and normal without evidence of ataxia, dysmetria or tremor. NO COGWHEELING.   Gait and station: Patient walks without assistive device and is able unassisted to climb up to the exam table . Strength within normal limits. Stance is stable and normal.  Tandem gait is impaired - turns with 4 Steps- his Romberg testing is negative . Deep tendon reflexes: in the  upper and lower extremities are symmetric, attenuated in patella and absent in achilles. and intact.  Assessment:  After physical and neurologic examination, review of laboratory studies, imaging, neurophysiology testing and pre-existing records, assessment will be reviewed on the problem list.  Plan:  Treatment  plan and additional workup :   Assessment; #1 complex sleep apnea was more  obstructive character.  the sleep apnea is optimally treated.  Assessment #2 REM behavior disorder, clinical description of onset time the briefness of spells, time of ocurrance and the patient's inability to recall these speaks for  REM behavior disorder - which is highly correlated to Lewy body disease and to Parkinson's disease.  REM behavior disorder can proceed for his disorders by 7 years. The treatment of choice his Klonopin, of which the patient already takes 1 mg nightly in addition to amitriptyline.   He ran out of KLONOPIN while at 3 mg daily dose and suffered myoclonic jerks in day and night time- weaned down to 1 mg. He is allowed to take 1.5 mg Klonopin should the Melatonin not have a beneficial effect 30 days.  I would like to read to reduce Celexa from 40 mg to 20 mg in AM , keep Klonopin at night and added Melatonin .       Education about apnea, central versus obstructive apnea, REM sleep behavior disorder, underlying pathophysiology, 30 minutes .  RV in 6 month with NP, Ward Givens for MOCA/MMSE and possible d/c amitriptyline , consider aricept

## 2014-07-26 ENCOUNTER — Other Ambulatory Visit: Payer: Self-pay | Admitting: Neurology

## 2014-10-13 DIAGNOSIS — H538 Other visual disturbances: Secondary | ICD-10-CM | POA: Diagnosis not present

## 2014-10-13 DIAGNOSIS — H2513 Age-related nuclear cataract, bilateral: Secondary | ICD-10-CM | POA: Diagnosis not present

## 2014-11-23 ENCOUNTER — Emergency Department (HOSPITAL_COMMUNITY): Payer: Medicare PPO

## 2014-11-23 ENCOUNTER — Encounter (HOSPITAL_COMMUNITY): Payer: Self-pay

## 2014-11-23 ENCOUNTER — Inpatient Hospital Stay (HOSPITAL_COMMUNITY)
Admission: EM | Admit: 2014-11-23 | Discharge: 2014-11-25 | DRG: 287 | Disposition: A | Discharge status: Home / Self Care | Payer: Medicare PPO | Attending: Internal Medicine | Admitting: Internal Medicine

## 2014-11-23 DIAGNOSIS — E119 Type 2 diabetes mellitus without complications: Secondary | ICD-10-CM | POA: Diagnosis not present

## 2014-11-23 DIAGNOSIS — Z86010 Personal history of colonic polyps: Secondary | ICD-10-CM | POA: Diagnosis not present

## 2014-11-23 DIAGNOSIS — E785 Hyperlipidemia, unspecified: Secondary | ICD-10-CM | POA: Diagnosis present

## 2014-11-23 DIAGNOSIS — I25110 Atherosclerotic heart disease of native coronary artery with unstable angina pectoris: Secondary | ICD-10-CM | POA: Diagnosis present

## 2014-11-23 DIAGNOSIS — K219 Gastro-esophageal reflux disease without esophagitis: Secondary | ICD-10-CM | POA: Diagnosis present

## 2014-11-23 DIAGNOSIS — I5033 Acute on chronic diastolic (congestive) heart failure: Principal | ICD-10-CM | POA: Diagnosis present

## 2014-11-23 DIAGNOSIS — Z794 Long term (current) use of insulin: Secondary | ICD-10-CM | POA: Diagnosis not present

## 2014-11-23 DIAGNOSIS — I10 Essential (primary) hypertension: Secondary | ICD-10-CM | POA: Diagnosis present

## 2014-11-23 DIAGNOSIS — F419 Anxiety disorder, unspecified: Secondary | ICD-10-CM | POA: Diagnosis present

## 2014-11-23 DIAGNOSIS — G4733 Obstructive sleep apnea (adult) (pediatric): Secondary | ICD-10-CM | POA: Diagnosis present

## 2014-11-23 DIAGNOSIS — Z9641 Presence of insulin pump (external) (internal): Secondary | ICD-10-CM | POA: Diagnosis present

## 2014-11-23 DIAGNOSIS — I200 Unstable angina: Secondary | ICD-10-CM | POA: Diagnosis not present

## 2014-11-23 DIAGNOSIS — I5032 Chronic diastolic (congestive) heart failure: Secondary | ICD-10-CM | POA: Diagnosis not present

## 2014-11-23 DIAGNOSIS — E039 Hypothyroidism, unspecified: Secondary | ICD-10-CM | POA: Diagnosis present

## 2014-11-23 DIAGNOSIS — R55 Syncope and collapse: Secondary | ICD-10-CM | POA: Diagnosis present

## 2014-11-23 DIAGNOSIS — R079 Chest pain, unspecified: Secondary | ICD-10-CM | POA: Diagnosis present

## 2014-11-23 DIAGNOSIS — R0600 Dyspnea, unspecified: Secondary | ICD-10-CM | POA: Diagnosis not present

## 2014-11-23 DIAGNOSIS — Z87891 Personal history of nicotine dependence: Secondary | ICD-10-CM

## 2014-11-23 DIAGNOSIS — F329 Major depressive disorder, single episode, unspecified: Secondary | ICD-10-CM | POA: Diagnosis present

## 2014-11-23 LAB — CBC
HCT: 43.2 % (ref 39.0–52.0)
Hemoglobin: 15.7 g/dL (ref 13.0–17.0)
MCH: 31.5 pg (ref 26.0–34.0)
MCHC: 36.3 g/dL — ABNORMAL HIGH (ref 30.0–36.0)
MCV: 86.7 fL (ref 78.0–100.0)
Platelets: 250 10*3/uL (ref 150–400)
RBC: 4.98 MIL/uL (ref 4.22–5.81)
RDW: 12.4 % (ref 11.5–15.5)
WBC: 7.6 10*3/uL (ref 4.0–10.5)

## 2014-11-23 LAB — COMPREHENSIVE METABOLIC PANEL
ALT: 40 U/L (ref 0–53)
AST: 30 U/L (ref 0–37)
Albumin: 3.5 g/dL (ref 3.5–5.2)
Alkaline Phosphatase: 109 U/L (ref 39–117)
Anion gap: 13 (ref 5–15)
BUN: 9 mg/dL (ref 6–23)
CO2: 23 mmol/L (ref 19–32)
Calcium: 8.6 mg/dL (ref 8.4–10.5)
Chloride: 107 mmol/L (ref 96–112)
Creatinine, Ser: 0.78 mg/dL (ref 0.50–1.35)
GFR calc Af Amer: >90 mL/min (ref >90)
GFR calc non Af Amer: >90 mL/min (ref >90)
Glucose, Bld: 177 mg/dL — ABNORMAL HIGH (ref 70–99)
Potassium: 3.4 mmol/L — ABNORMAL LOW (ref 3.5–5.1)
Sodium: 143 mmol/L (ref 135–145)
Total Bilirubin: 0.9 mg/dL (ref 0.3–1.2)
Total Protein: 6.4 g/dL (ref 6.0–8.3)

## 2014-11-23 LAB — I-STAT TROPONIN, ED: Troponin i, poc: 0 ng/mL (ref 0.00–0.08)

## 2014-11-23 LAB — LIPASE, BLOOD: Lipase: 24 U/L (ref 11–59)

## 2014-11-23 MED ORDER — NITROGLYCERIN 0.4 MG SL SUBL
0.4000 mg | SUBLINGUAL_TABLET | SUBLINGUAL | Status: DC | PRN
  Administered 2014-11-23: 0.4 mg via SUBLINGUAL
  Filled 2014-11-23: qty 1

## 2014-11-23 NOTE — ED Notes (Signed)
Pt reports chest pain that started about an hr and a half ago.  Pt on the left side of the chest and radiated to both arms. Pt given 324 of ASA and 1 nitro prior to arrival.  Pain initially 7/10; now a 3/10.  Pt with shortness of breath and diaphoresis.

## 2014-11-23 NOTE — ED Provider Notes (Signed)
CSN: [id number]     Arrival date & time 11/23/14  2006 History   First MD Initiated Contact with Patient 11/23/14 2011     Chief Complaint  Patient presents with  . Chest Pain     (Consider location/radiation/quality/duration/timing/severity/associated sxs/prior Treatment) Patient is a 59 y.o. male presenting with chest pain.  Chest Pain Pain location:  Substernal area Pain quality: dull   Pain radiates to:  Does not radiate Pain radiates to the back: no   Pain severity:  Moderate Onset quality:  Gradual Duration:  1 hour Timing:  Constant Progression:  Partially resolved Chronicity:  New Context: at rest   Context comment:  ETOH, at dinner, at rest Relieved by:  Nothing Worsened by:  Nothing tried Ineffective treatments:  None tried Associated symptoms: diaphoresis and shortness of breath   Associated symptoms: no abdominal pain, no nausea and not vomiting     Past Medical History  Diagnosis Date  . Thyroid disorder   . Depression   . Anxiety   . Diabetes mellitus without complication   . Hyperlipidemia   . Sleep apnea with use of continuous positive airway pressure (CPAP)     diagnsed AHi of 20 in 2010 , titrated to 13 cm H20.  Marland Kitchen History of diplopia      third nerve palsy 2013   . Hypertension   . GERD (gastroesophageal reflux disease)   . Hiatal hernia   . Carpal tunnel syndrome   . Obesity   . Colon polyp   . Erectile dysfunction    Past Surgical History  Procedure Laterality Date  . Hemorrectomy     Family History  Problem Relation Age of Onset  . Heart disease Mother   . Heart disease Father   . Diabetes Father   . Stroke Father   . Seizures Father   . Cancer Sister   . Heart disease Sister   . Heart disease Brother   . Diabetes Brother   . Heart disease Maternal Grandmother   . Heart disease Maternal Grandfather   . Heart disease Paternal Grandmother   . Heart disease Paternal Grandfather   . Stroke Other    History  Substance Use Topics   . Smoking status: Former Smoker    Quit date: 09/09/2002  . Smokeless tobacco: Never Used     Comment: quit smoking 2004  . Alcohol Use: Yes     Comment: occas.    Review of Systems  Constitutional: Positive for diaphoresis.  Respiratory: Positive for shortness of breath.   Cardiovascular: Positive for chest pain.  Gastrointestinal: Negative for nausea, vomiting and abdominal pain.  All other systems reviewed and are negative.     Allergies  Review of patient's allergies indicates no known allergies.  Home Medications   Prior to Admission medications   Medication Sig Start Date End Date Taking? Authorizing Provider  amitriptyline (ELAVIL) 10 MG tablet Take 1 tablet (10 mg total) by mouth at bedtime as needed for sleep. 06/02/14  Yes Carmen Dohmeier, MD  benazepril (LOTENSIN) 10 MG tablet  01/16/13  Yes Historical Provider, MD  Blood Glucose Calibration (OT ULTRA/FASTTK CNTRL SOLN) SOLN  03/16/14  Yes Historical Provider, MD  citalopram (CELEXA) 40 MG tablet TAKE ONE HALF TABLET BY MOUTH EVERY DAY 07/27/14  Yes Asencion Partridge Dohmeier, MD  clonazePAM (KLONOPIN) 1 MG tablet Take 1.5 tablets (1.5 mg total) by mouth at bedtime. Patient taking differently: Take 0.5 mg by mouth at bedtime as needed for anxiety.  06/02/14  Yes Larey Seat, MD  levothyroxine (SYNTHROID, LEVOTHROID) 200 MCG tablet  01/16/13  Yes Historical Provider, MD  Melatonin 5 MG TBDP Take 5 mg by mouth Nightly. 06/02/14  Yes Carmen Dohmeier, MD  NOVOLOG 100 UNIT/ML injection Inject 0-80 Units into the skin continuous. Insulin pump 01/01/13  Yes Historical Provider, MD  omeprazole (PRILOSEC) 20 MG capsule Take 40 mg by mouth daily.  01/16/13  Yes Historical Provider, MD  ONE TOUCH ULTRA TEST test strip  05/10/14  Yes Historical Provider, MD  pravastatin (PRAVACHOL) 40 MG tablet  01/16/13  Yes Historical Provider, MD  spironolactone-hydrochlorothiazide (ALDACTAZIDE) 25-25 MG per tablet  01/16/13  Yes Historical Provider, MD   BP  136/74 mmHg  Pulse 83  Temp(Src) 98.3 F (36.8 C) (Oral)  Resp 15  SpO2 95% Physical Exam  Constitutional: He is oriented to person, place, and time. He appears well-developed and well-nourished.  HENT:  Head: Normocephalic and atraumatic.  Eyes: Conjunctivae and EOM are normal.  Neck: Normal range of motion. Neck supple.  Cardiovascular: Normal rate, regular rhythm and normal heart sounds.   Pulmonary/Chest: Effort normal and breath sounds normal. No respiratory distress.  Abdominal: He exhibits no distension. There is no tenderness. There is no rebound and no guarding.  Musculoskeletal: Normal range of motion.  Neurological: He is alert and oriented to person, place, and time.  Skin: Skin is warm and dry.  Vitals reviewed.   ED Course  Procedures (including critical care time) Labs Review Labs Reviewed  CBC - Abnormal; Notable for the following:    MCHC 36.3 (*)    All other components within normal limits  COMPREHENSIVE METABOLIC PANEL - Abnormal; Notable for the following:    Potassium 3.4 (*)    Glucose, Bld 177 (*)    All other components within normal limits  LIPASE, BLOOD  D-DIMER, QUANTITATIVE  I-STAT TROPOININ, ED    Imaging Review Dg Chest Port 1 View  11/23/2014   CLINICAL DATA:  Chest pain and dyspnea, 1 day duration  EXAM: PORTABLE CHEST - 1 VIEW  COMPARISON:  08/19/2008  FINDINGS: A single AP portable view of the chest demonstrates no focal airspace consolidation or alveolar edema. The lungs are grossly clear. There is no large effusion or pneumothorax. Cardiac and mediastinal contours appear unremarkable.  IMPRESSION: No active disease.   Electronically Signed   By: Andreas Newport M.D.   On: 11/23/2014 21:00     EKG Interpretation   Date/Time:  Wednesday November 23 2014 20:17:49 EDT Ventricular Rate:  83 PR Interval:  126 QRS Duration: 90 QT Interval:  377 QTC Calculation: 443 R Axis:   71 Text Interpretation:  Sinus rhythm Borderline  repolarization abnormality  No significant change since last tracing Confirmed by Debby Freiberg  [phone]) on 11/23/2014 8:30:34 PM      MDM   Final diagnoses:  None    59 y.o. male with pertinent PMH of HTN, DM, anxiety presents with chest pain as above.  History concerning for unstable angina and ACS.  Consulted medicine for admission after initial workup as above unremarkable.  I have reviewed all laboratory and imaging studies if ordered as above  Unstable angina      Debby Freiberg, MD 11/24/14 [phone]

## 2014-11-24 ENCOUNTER — Encounter (HOSPITAL_COMMUNITY)
Admission: EM | Disposition: A | Discharge status: Home / Self Care | Payer: Self-pay | Source: Home / Self Care | Attending: Internal Medicine

## 2014-11-24 ENCOUNTER — Encounter (HOSPITAL_COMMUNITY): Payer: Self-pay | Admitting: Internal Medicine

## 2014-11-24 DIAGNOSIS — Z9641 Presence of insulin pump (external) (internal): Secondary | ICD-10-CM | POA: Diagnosis present

## 2014-11-24 DIAGNOSIS — I10 Essential (primary) hypertension: Secondary | ICD-10-CM | POA: Diagnosis present

## 2014-11-24 DIAGNOSIS — R0600 Dyspnea, unspecified: Secondary | ICD-10-CM | POA: Diagnosis not present

## 2014-11-24 DIAGNOSIS — F419 Anxiety disorder, unspecified: Secondary | ICD-10-CM | POA: Diagnosis present

## 2014-11-24 DIAGNOSIS — Z794 Long term (current) use of insulin: Secondary | ICD-10-CM | POA: Diagnosis not present

## 2014-11-24 DIAGNOSIS — I200 Unstable angina: Secondary | ICD-10-CM

## 2014-11-24 DIAGNOSIS — I25110 Atherosclerotic heart disease of native coronary artery with unstable angina pectoris: Secondary | ICD-10-CM | POA: Diagnosis present

## 2014-11-24 DIAGNOSIS — K219 Gastro-esophageal reflux disease without esophagitis: Secondary | ICD-10-CM | POA: Diagnosis present

## 2014-11-24 DIAGNOSIS — E119 Type 2 diabetes mellitus without complications: Secondary | ICD-10-CM | POA: Diagnosis present

## 2014-11-24 DIAGNOSIS — Z87891 Personal history of nicotine dependence: Secondary | ICD-10-CM | POA: Diagnosis not present

## 2014-11-24 DIAGNOSIS — Z86010 Personal history of colonic polyps: Secondary | ICD-10-CM | POA: Diagnosis not present

## 2014-11-24 DIAGNOSIS — G4733 Obstructive sleep apnea (adult) (pediatric): Secondary | ICD-10-CM | POA: Diagnosis present

## 2014-11-24 DIAGNOSIS — I5032 Chronic diastolic (congestive) heart failure: Secondary | ICD-10-CM | POA: Diagnosis not present

## 2014-11-24 DIAGNOSIS — E785 Hyperlipidemia, unspecified: Secondary | ICD-10-CM | POA: Diagnosis present

## 2014-11-24 DIAGNOSIS — E039 Hypothyroidism, unspecified: Secondary | ICD-10-CM | POA: Diagnosis present

## 2014-11-24 DIAGNOSIS — F329 Major depressive disorder, single episode, unspecified: Secondary | ICD-10-CM | POA: Diagnosis present

## 2014-11-24 DIAGNOSIS — I5033 Acute on chronic diastolic (congestive) heart failure: Secondary | ICD-10-CM | POA: Diagnosis present

## 2014-11-24 DIAGNOSIS — R079 Chest pain, unspecified: Secondary | ICD-10-CM | POA: Diagnosis present

## 2014-11-24 DIAGNOSIS — R55 Syncope and collapse: Secondary | ICD-10-CM | POA: Diagnosis present

## 2014-11-24 HISTORY — PX: LEFT HEART CATHETERIZATION WITH CORONARY ANGIOGRAM: SHX5451

## 2014-11-24 LAB — PROTIME-INR
INR: 1 (ref 0.00–1.49)
Prothrombin Time: 13.3 seconds (ref 11.6–15.2)

## 2014-11-24 LAB — MAGNESIUM: Magnesium: 1.9 mg/dL (ref 1.5–2.5)

## 2014-11-24 LAB — CBC
HCT: 43.2 % (ref 39.0–52.0)
HCT: 45.6 % (ref 39.0–52.0)
Hemoglobin: 15.9 g/dL (ref 13.0–17.0)
Hemoglobin: 16.8 g/dL (ref 13.0–17.0)
MCH: 32 pg (ref 26.0–34.0)
MCH: 32.3 pg (ref 26.0–34.0)
MCHC: 36.8 g/dL — ABNORMAL HIGH (ref 30.0–36.0)
MCHC: 36.8 g/dL — ABNORMAL HIGH (ref 30.0–36.0)
MCV: 86.9 fL (ref 78.0–100.0)
MCV: 87.7 fL (ref 78.0–100.0)
Platelets: 252 10*3/uL (ref 150–400)
Platelets: 258 10*3/uL (ref 150–400)
RBC: 4.97 MIL/uL (ref 4.22–5.81)
RBC: 5.2 MIL/uL (ref 4.22–5.81)
RDW: 12.7 % (ref 11.5–15.5)
RDW: 12.5 % (ref 11.5–15.5)
WBC: 7.7 10*3/uL (ref 4.0–10.5)
WBC: 14.9 10*3/uL — ABNORMAL HIGH (ref 4.0–10.5)

## 2014-11-24 LAB — CREATININE, SERUM
Creatinine, Ser: 0.72 mg/dL (ref 0.50–1.35)
Creatinine, Ser: 0.84 mg/dL (ref 0.50–1.35)
GFR calc Af Amer: >90 mL/min (ref >90)
GFR calc Af Amer: >90 mL/min (ref >90)
GFR calc non Af Amer: >90 mL/min (ref >90)
GFR calc non Af Amer: >90 mL/min (ref >90)

## 2014-11-24 LAB — COMPREHENSIVE METABOLIC PANEL
ALT: 38 U/L (ref 0–53)
AST: 25 U/L (ref 0–37)
Albumin: 3.5 g/dL (ref 3.5–5.2)
Alkaline Phosphatase: 109 U/L (ref 39–117)
Anion gap: 8 (ref 5–15)
BUN: 11 mg/dL (ref 6–23)
CO2: 25 mmol/L (ref 19–32)
Calcium: 8.7 mg/dL (ref 8.4–10.5)
Chloride: 106 mmol/L (ref 96–112)
Creatinine, Ser: 0.78 mg/dL (ref 0.50–1.35)
GFR calc Af Amer: >90 mL/min (ref >90)
GFR calc non Af Amer: >90 mL/min (ref >90)
Glucose, Bld: 171 mg/dL — ABNORMAL HIGH (ref 70–99)
Potassium: 3.7 mmol/L (ref 3.5–5.1)
Sodium: 139 mmol/L (ref 135–145)
Total Bilirubin: 1.5 mg/dL — ABNORMAL HIGH (ref 0.3–1.2)
Total Protein: 6.7 g/dL (ref 6.0–8.3)

## 2014-11-24 LAB — D-DIMER, QUANTITATIVE (NOT AT ARMC): D-Dimer, Quant: 0.3 ug/mL-FEU (ref 0.00–0.48)

## 2014-11-24 LAB — CBG MONITORING, ED
Glucose-Capillary: 106 mg/dL — ABNORMAL HIGH (ref 70–99)
Glucose-Capillary: 144 mg/dL — ABNORMAL HIGH (ref 70–99)
Glucose-Capillary: 173 mg/dL — ABNORMAL HIGH (ref 70–99)

## 2014-11-24 LAB — GLUCOSE, CAPILLARY
Glucose-Capillary: 364 mg/dL — ABNORMAL HIGH (ref 70–99)
Glucose-Capillary: 146 mg/dL — ABNORMAL HIGH (ref 70–99)

## 2014-11-24 LAB — TROPONIN I
Troponin I: <0.03 ng/mL (ref <0.031)
Troponin I: <0.03 ng/mL (ref <0.031)

## 2014-11-24 LAB — TSH: TSH: 0.05 u[IU]/mL — ABNORMAL LOW (ref 0.350–4.500)

## 2014-11-24 SURGERY — LEFT HEART CATHETERIZATION WITH CORONARY ANGIOGRAM
Anesthesia: LOCAL

## 2014-11-24 MED ORDER — FUROSEMIDE 10 MG/ML IJ SOLN
40.0000 mg | Freq: Once | INTRAMUSCULAR | Status: AC
Start: 2014-11-24 — End: 2014-11-24
  Administered 2014-11-24: 40 mg via INTRAVENOUS
  Filled 2014-11-24: qty 4

## 2014-11-24 MED ORDER — SPIRONOLACTONE-HCTZ 25-25 MG PO TABS
1.0000 | ORAL_TABLET | Freq: Every day | ORAL | Status: DC
  Filled 2014-11-24: qty 1

## 2014-11-24 MED ORDER — ONDANSETRON HCL 4 MG PO TABS: 4.0000 mg | ORAL_TABLET | Freq: Four times a day (QID) | ORAL | Status: DC | PRN

## 2014-11-24 MED ORDER — INSULIN PUMP
Freq: Three times a day (TID) | SUBCUTANEOUS | Status: DC
  Administered 2014-11-24 – 2014-11-25 (×3): via SUBCUTANEOUS
  Filled 2014-11-24: qty 1

## 2014-11-24 MED ORDER — ACETAMINOPHEN 325 MG PO TABS: 650.0000 mg | ORAL_TABLET | Freq: Four times a day (QID) | ORAL | Status: DC | PRN

## 2014-11-24 MED ORDER — ENOXAPARIN SODIUM 40 MG/0.4ML ~~LOC~~ SOLN
40.0000 mg | SUBCUTANEOUS | Status: DC
  Administered 2014-11-25: 40 mg via SUBCUTANEOUS
  Filled 2014-11-24 (×2): qty 0.4

## 2014-11-24 MED ORDER — ASPIRIN EC 325 MG PO TBEC
325.0000 mg | DELAYED_RELEASE_TABLET | Freq: Every day | ORAL | Status: DC
  Administered 2014-11-24 – 2014-11-25 (×2): 325 mg via ORAL
  Filled 2014-11-24 (×2): qty 1

## 2014-11-24 MED ORDER — MIDAZOLAM HCL 2 MG/2ML IJ SOLN
INTRAMUSCULAR | Status: AC
  Filled 2014-11-24: qty 2

## 2014-11-24 MED ORDER — POTASSIUM CHLORIDE CRYS ER 20 MEQ PO TBCR
40.0000 meq | EXTENDED_RELEASE_TABLET | Freq: Once | ORAL | Status: AC
  Administered 2014-11-24: 40 meq via ORAL
  Filled 2014-11-24: qty 2

## 2014-11-24 MED ORDER — FENTANYL CITRATE 0.05 MG/ML IJ SOLN
INTRAMUSCULAR | Status: AC
  Filled 2014-11-24: qty 2

## 2014-11-24 MED ORDER — ENOXAPARIN SODIUM 40 MG/0.4ML ~~LOC~~ SOLN
40.0000 mg | SUBCUTANEOUS | Status: DC
  Filled 2014-11-24: qty 0.4

## 2014-11-24 MED ORDER — AMITRIPTYLINE HCL 10 MG PO TABS
10.0000 mg | ORAL_TABLET | Freq: Every evening | ORAL | Status: DC | PRN
  Filled 2014-11-24: qty 1

## 2014-11-24 MED ORDER — ACETAMINOPHEN 650 MG RE SUPP: 650.0000 mg | Freq: Four times a day (QID) | RECTAL | Status: DC | PRN

## 2014-11-24 MED ORDER — CLONAZEPAM 0.5 MG PO TABS: 0.5000 mg | ORAL_TABLET | Freq: Every evening | ORAL | Status: DC | PRN

## 2014-11-24 MED ORDER — NITROGLYCERIN 1 MG/10 ML FOR IR/CATH LAB
INTRA_ARTERIAL | Status: AC
  Filled 2014-11-24: qty 10

## 2014-11-24 MED ORDER — PRAVASTATIN SODIUM 40 MG PO TABS
40.0000 mg | ORAL_TABLET | Freq: Every day | ORAL | Status: DC
  Administered 2014-11-24: 40 mg via ORAL
  Filled 2014-11-24 (×3): qty 1

## 2014-11-24 MED ORDER — SODIUM CHLORIDE 0.9 % IJ SOLN: 3.0000 mL | INTRAMUSCULAR | Status: DC | PRN

## 2014-11-24 MED ORDER — CITALOPRAM HYDROBROMIDE 20 MG PO TABS
20.0000 mg | ORAL_TABLET | Freq: Every day | ORAL | Status: DC
  Administered 2014-11-24 – 2014-11-25 (×2): 20 mg via ORAL
  Filled 2014-11-24: qty 2
  Filled 2014-11-24: qty 1

## 2014-11-24 MED ORDER — PANTOPRAZOLE SODIUM 40 MG PO TBEC
40.0000 mg | DELAYED_RELEASE_TABLET | Freq: Every day | ORAL | Status: DC
  Administered 2014-11-24 – 2014-11-25 (×2): 40 mg via ORAL
  Filled 2014-11-24 (×3): qty 1

## 2014-11-24 MED ORDER — BENAZEPRIL HCL 10 MG PO TABS
10.0000 mg | ORAL_TABLET | Freq: Every day | ORAL | Status: DC
  Administered 2014-11-24 – 2014-11-25 (×2): 10 mg via ORAL
  Filled 2014-11-24 (×3): qty 1

## 2014-11-24 MED ORDER — SODIUM CHLORIDE 0.9 % IV SOLN
INTRAVENOUS | Status: DC
  Administered 2014-11-24: 15:00:00 via INTRAVENOUS

## 2014-11-24 MED ORDER — HEPARIN SODIUM (PORCINE) 1000 UNIT/ML IJ SOLN
INTRAMUSCULAR | Status: AC
  Filled 2014-11-24: qty 1

## 2014-11-24 MED ORDER — SODIUM CHLORIDE 0.9 % IJ SOLN
3.0000 mL | Freq: Two times a day (BID) | INTRAMUSCULAR | Status: DC
  Administered 2014-11-24 – 2014-11-25 (×2): 3 mL via INTRAVENOUS

## 2014-11-24 MED ORDER — HEPARIN (PORCINE) IN NACL 2-0.9 UNIT/ML-% IJ SOLN
INTRAMUSCULAR | Status: AC
  Filled 2014-11-24: qty 1500

## 2014-11-24 MED ORDER — ASPIRIN 81 MG PO CHEW: 81.0000 mg | CHEWABLE_TABLET | ORAL | Status: DC

## 2014-11-24 MED ORDER — INSULIN GLARGINE 100 UNIT/ML ~~LOC~~ SOLN
20.0000 [IU] | Freq: Every day | SUBCUTANEOUS | Status: DC
  Filled 2014-11-24: qty 0.2

## 2014-11-24 MED ORDER — MORPHINE SULFATE 2 MG/ML IJ SOLN: 1.0000 mg | INTRAMUSCULAR | Status: DC | PRN

## 2014-11-24 MED ORDER — SODIUM CHLORIDE 0.9 % IV SOLN
1.0000 mL/kg/h | INTRAVENOUS | Status: AC
  Administered 2014-11-24: 1 mL/kg/h via INTRAVENOUS

## 2014-11-24 MED ORDER — INSULIN ASPART 100 UNIT/ML ~~LOC~~ SOLN: 0.0000 [IU] | SUBCUTANEOUS | Status: DC

## 2014-11-24 MED ORDER — INSULIN PUMP
SUBCUTANEOUS | Status: DC
  Administered 2014-11-24: 07:00:00 via SUBCUTANEOUS

## 2014-11-24 MED ORDER — SODIUM CHLORIDE 0.9 % IJ SOLN: 3.0000 mL | Freq: Two times a day (BID) | INTRAMUSCULAR | Status: DC

## 2014-11-24 MED ORDER — SODIUM CHLORIDE 0.9 % IV SOLN: 250.0000 mL | INTRAVENOUS | Status: DC | PRN

## 2014-11-24 MED ORDER — LEVOTHYROXINE SODIUM 200 MCG PO TABS
200.0000 ug | ORAL_TABLET | Freq: Every day | ORAL | Status: DC
  Administered 2014-11-24 – 2014-11-25 (×2): 200 ug via ORAL
  Filled 2014-11-24 (×3): qty 1

## 2014-11-24 MED ORDER — LIDOCAINE HCL (PF) 1 % IJ SOLN
INTRAMUSCULAR | Status: AC
  Filled 2014-11-24: qty 30

## 2014-11-24 MED ORDER — VERAPAMIL HCL 2.5 MG/ML IV SOLN
INTRAVENOUS | Status: AC
  Filled 2014-11-24: qty 2

## 2014-11-24 MED ORDER — ONDANSETRON HCL 4 MG/2ML IJ SOLN: 4.0000 mg | Freq: Four times a day (QID) | INTRAMUSCULAR | Status: DC | PRN

## 2014-11-24 MED ORDER — MELATONIN 5 MG PO TBDP: 5.0000 mg | ORAL_TABLET | Freq: Every evening | ORAL | Status: DC

## 2014-11-24 MED ORDER — INSULIN ASPART 100 UNIT/ML ~~LOC~~ SOLN: 0.0000 [IU] | Freq: Three times a day (TID) | SUBCUTANEOUS | Status: DC

## 2014-11-24 NOTE — Progress Notes (Signed)
Checked on the patient's cath site, appears to be stable at this time, no longer bleeding. No discomfort. BP within tolerable range. Hopeful can take off TR band soon without worry about rebleed. If rebleed occur, will need to hold pressure prox to the bleeding site.  Hilbert Corrigan PA Pager: [phone]

## 2014-11-24 NOTE — Progress Notes (Signed)
Pt transferred to cath lab after being admitted to [facility] from ED.  Was unable to admin ASA 81mg  prior to procedure due to being unavailable from pharmacy and transport was already on unit to take pt.

## 2014-11-24 NOTE — Consult Note (Signed)
CARDIOLOGY CONSULT NOTE     Patient ID: PACE LAMADRID MRN: [id number] DOB/AGE: [birth year] 59 y.o.  Admit date: 11/23/2014 Referring Physician Gean Birchwood MD Primary Physician Haywood Pao, MD Primary Cardiologist N/A Reason for Consultation Chest pain.  HPI: 59 yo WM we are requested to see by the hospitalist service for evaluation of chest pain. 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 He also has HTN, HL, OSA, and hypothyroidism. He presented to to ED last night with acute dyspnea associated with chest pressure. He described retrosternal pain to the admitting physician but denies any pain to me. He does complain of pain in his left arm over the last three days beginning in his left wrist and radiating to his shoulder. This is worse with activity and movement. His wife reported on admission that he briefly lost consciousness but he does not recall this. He does report an ETT about 10 years ago and a pharmacologic nuclear stress test about 5 years ago without significant findings.   Past Medical History  Diagnosis Date  . Thyroid disorder   . Depression   . Anxiety   . Diabetes mellitus without complication   . Hyperlipidemia   . Sleep apnea with use of continuous positive airway pressure (CPAP)     diagnsed AHi of 20 in 2010 , titrated to 13 cm H20.  Marland Kitchen History of diplopia      third nerve palsy 2013   . Hypertension   . GERD (gastroesophageal reflux disease)   . Hiatal hernia   . Carpal tunnel syndrome   . Obesity   . Colon polyp   . Erectile dysfunction     Family History  Problem Relation Age of Onset  . Heart disease Mother   . Heart disease Father   . Diabetes Father   . Stroke Father   . Seizures Father   . Cancer Sister   . Heart disease Sister   . Heart disease Brother   . Diabetes Brother   . Heart disease Maternal Grandmother   . Heart disease Maternal Grandfather   . Heart disease Paternal Grandmother   . Heart disease Paternal Grandfather   .  Stroke Other     History   Social History  . Marital Status: Married    Spouse Name: [name]  . Number of Children: 0  . Years of Education: 12   Occupational History  . retired     Water quality scientist   Social History Main Topics  . Smoking status: Former Smoker    Quit date: 09/09/2002  . Smokeless tobacco: Never Used     Comment: quit smoking 2004  . Alcohol Use: Yes     Comment: occas.  . Drug Use: No  . Sexual Activity: Not on file   Other Topics Concern  . Not on file   Social History Narrative    may continue treatment of complex apnea with his current CPAP setting , good residual AHI of 3.3/   Has improved by [facility] and has only once a night  nocturia , less shortness of breath.   gel mask fits well acc. to air leak data but has left a pressure mark.    He may need an alternative mask until it's healed. I recommended a swift FX medium pillows .   DME [employer] - machine , HCS with Nicole Kindred,  RT.      Patient is married [name]) and lives at home with his wife and step-son.  Patient is retired.   Patient has a high school.   Patient is right-handed.   Patient drinks 1-2 cups of coffee every morning.             Past Surgical History  Procedure Laterality Date  . Hemorrectomy         Medication List    ASK your doctor about these medications        amitriptyline 10 MG tablet  Commonly known as:  ELAVIL  Take 1 tablet (10 mg total) by mouth at bedtime as needed for sleep.     benazepril 10 MG tablet  Commonly known as:  LOTENSIN     citalopram 40 MG tablet  Commonly known as:  CELEXA  TAKE ONE HALF TABLET BY MOUTH EVERY DAY     clonazePAM 1 MG tablet  Commonly known as:  KLONOPIN  Take 1.5 tablets (1.5 mg total) by mouth at bedtime.     levothyroxine 200 MCG tablet  Commonly known as:  SYNTHROID, LEVOTHROID     Melatonin 5 MG Tbdp  Take 5 mg by mouth Nightly.     NOVOLOG 100 UNIT/ML injection  Generic drug:  insulin aspart  Inject 0-80  Units into the skin continuous. Insulin pump     omeprazole 20 MG capsule  Commonly known as:  PRILOSEC  Take 40 mg by mouth daily.     ONE TOUCH ULTRA TEST test strip  Generic drug:  glucose blood     OT ULTRA/FASTTK CNTRL SOLN Soln     pravastatin 40 MG tablet  Commonly known as:  PRAVACHOL     spironolactone-hydrochlorothiazide 25-25 MG per tablet  Commonly known as:  ALDACTAZIDE         ROS: As noted in HPI. All other systems are reviewed and are negative unless otherwise mentioned.   Physical Exam: Blood pressure 126/67, pulse 82, temperature 98.2 F (36.8 C), temperature source Oral, resp. rate 15, SpO2 98 %. Current Weight  06/02/14 154 lb (69.854 kg)  09/08/13 161 lb (73.029 kg)  08/23/13 161 lb 4 oz (73.143 kg)    GENERAL:  Well appearing WM in NAD HEENT:  PERRL, EOMI, sclera are clear. Oropharynx is clear. NECK:  No jugular venous distention, carotid upstroke brisk and symmetric, no bruits, no thyromegaly or adenopathy LUNGS:  Clear to auscultation bilaterally CHEST:  Unremarkable HEART:  RRR,  PMI not displaced or sustained,S1 and S2 within normal limits, no S3, no S4: no clicks, no rubs, no murmurs ABD:  Soft, nontender. BS +, no masses or bruits. No hepatomegaly, no splenomegaly EXT:  2 + pulses throughout, no edema, no cyanosis no clubbing SKIN:  Warm and dry.  No rashes NEURO:  Alert and oriented x 3. Cranial nerves II through XII intact. PSYCH:  Cognitively intact    Labs:   Lab Results  Component Value Date   WBC 7.7 11/24/2014   HGB 15.9 11/24/2014   HCT 43.2 11/24/2014   MCV 86.9 11/24/2014   PLT 252 11/24/2014    Recent Labs Lab 11/24/14 0729  NA 139  K 3.7  CL 106  CO2 25  BUN 11  CREATININE 0.78  CALCIUM 8.7  PROT 6.7  BILITOT 1.5*  ALKPHOS 109  ALT 38  AST 25  GLUCOSE 171*   Lab Results  Component Value Date   TROPONINI <0.03 11/24/2014   TROPONINI <0.03 11/24/2014   No results found for: CHOL No results found for:  HDL No results found for: [facility]  No results found for: TRIG No results found for: CHOLHDL No results found for: LDLDIRECT  No results found for: PROBNP Lab Results  Component Value Date   TSH 0.050* 11/24/2014   No results found for: HGBA1C  Radiology: Dg Chest Port 1 View  11/23/2014   CLINICAL DATA:  Chest pain and dyspnea, 1 day duration  EXAM: PORTABLE CHEST - 1 VIEW  COMPARISON:  08/19/2008  FINDINGS: A single AP portable view of the chest demonstrates no focal airspace consolidation or alveolar edema. The lungs are grossly clear. There is no large effusion or pneumothorax. Cardiac and mediastinal contours appear unremarkable.  IMPRESSION: No active disease.   Electronically Signed   By: Andreas Newport M.D.   On: 11/23/2014 21:00    EKG: NSR with T wave inversion consistent with inferior wall ischemia. This was compared to an Ecg done at [facility] on 04/07/12 and the T wave changes are new.   ASSESSMENT AND PLAN:  1. Unstable angina. Symptoms are somewhat atypical but certainly concerning for USAP. He has new T wave changes inferiorly. He has normal d-dimer and troponins. He has multiple cardiac risk factors.  2. IDDM on insulin pump. 3. HTN 4. Hyperlipidemia. On statin 5. OSA with disordered sleep. Uses CPAP 6. Hypothyroidism  I have recommended proceeding directly to cardiac cath given high suspicion of acute coronary syndrome. The procedure and risks were reviewed including but not limited to death, myocardial infarction, stroke, arrythmias, bleeding, transfusion, emergency surgery, dye allergy, or renal dysfunction. The patient voices understanding and is agreeable to proceed. Will add to schedule this afternoon. Keep NPO  Signed: Peter Martinique, [place]  11/24/2014, 12:24 PM

## 2014-11-24 NOTE — Progress Notes (Signed)
[facility] PROGRESS NOTE  Andrew Oconnor IOE:[id number] DOB: 02/04/1956 DOA: 11/23/2014 PCP: Haywood Pao, MD  Assessment/Plan:  1.  Angina - cardiology consulted. On aspirin, statin.   2. syncope - may be related to his chest pain. Closely monitor in telemetry for any arrhythmia. Check 2-D echo. Neuro exam non focal.   3. Diabetes mellitus type 2 on insulin pump at home; will discontinue insulin pump while in hospital. Start lantus, and SSI.  4. Hypertension - Hold diuretics.  5. Hyperlipidemia - continue statins. 6. OSA - on CPAP 7. Hypothyroidism on Synthroid.   Code Status: Full code.  Family Communication: Care discussed with patient.  Disposition Plan: Remain inpatient.    Consultants:  Cardiology  Procedures:  none  Antibiotics:  none  HPI/Subjective: Chest pain free. He pass out yesterday. Denies focal weakness. No urinary or bowel incontinence.   Objective: Filed Vitals:   11/24/14 1411  BP: 154/66  Pulse: 82  Temp:   Resp: 16    Intake/Output Summary (Last 24 hours) at 11/24/14 1428 Last data filed at 11/24/14 1410  Gross per 24 hour  Intake    120 ml  Output    200 ml  Net    -80 ml   Filed Weights   11/24/14 1411  Weight: 68.1 kg (150 lb 2.1 oz)    Exam:   General:  Alert in no distress.   Cardiovascular: S 1, S 2 RRR  Respiratory: CTA  Abdomen: BS present, soft, nt  Musculoskeletal: no edema   Data Reviewed: Basic Metabolic Panel:  Recent Labs Lab 11/23/14 2057 11/24/14 0130 11/24/14 0729  NA 143  --  139  K 3.4*  --  3.7  CL 107  --  106  CO2 23  --  25  GLUCOSE 177*  --  171*  BUN 9  --  11  CREATININE 0.78 0.72 0.78  CALCIUM 8.6  --  8.7  MG  --  1.9  --    Liver Function Tests:  Recent Labs Lab 11/23/14 2057 11/24/14 0729  AST 30 25  ALT 40 38  ALKPHOS 109 109  BILITOT 0.9 1.5*  PROT 6.4 6.7  ALBUMIN 3.5 3.5    Recent Labs Lab 11/23/14 2057  LIPASE 24   No results for input(s):  AMMONIA in the last 168 hours. CBC:  Recent Labs Lab 11/23/14 2057 11/24/14 0130  WBC 7.6 7.7  HGB 15.7 15.9  HCT 43.2 43.2  MCV 86.7 86.9  PLT 250 252   Cardiac Enzymes:  Recent Labs Lab 11/24/14 0130 11/24/14 1330  TROPONINI <0.03 <0.03   BNP (last 3 results) No results for input(s): BNP in the last 8760 hours.  ProBNP (last 3 results) No results for input(s): PROBNP in the last 8760 hours.  CBG:  Recent Labs Lab 11/24/14 0145 11/24/14 0652 11/24/14 1303  GLUCAP 106* 144* 173*    No results found for this or any previous visit (from the past 240 hour(s)).   Studies: Dg Chest Port 1 View  11/23/2014   CLINICAL DATA:  Chest pain and dyspnea, 1 day duration  EXAM: PORTABLE CHEST - 1 VIEW  COMPARISON:  08/19/2008  FINDINGS: A single AP portable view of the chest demonstrates no focal airspace consolidation or alveolar edema. The lungs are grossly clear. There is no large effusion or pneumothorax. Cardiac and mediastinal contours appear unremarkable.  IMPRESSION: No active disease.   Electronically Signed   By: Andreas Newport M.D.   On:  11/23/2014 21:00    Scheduled Meds: . aspirin  81 mg Oral Pre-Cath  . aspirin EC  325 mg Oral Daily  . benazepril  10 mg Oral Daily  . citalopram  20 mg Oral Daily  . enoxaparin (LOVENOX) injection  40 mg Subcutaneous Q24H  . insulin aspart  0-15 Units Subcutaneous TID WC  . insulin glargine  20 Units Subcutaneous QHS  . levothyroxine  200 mcg Oral QAC breakfast  . pantoprazole  40 mg Oral Daily  . pravastatin  40 mg Oral q1800  . sodium chloride  3 mL Intravenous Q12H  . sodium chloride  3 mL Intravenous Q12H   Continuous Infusions: . sodium chloride      Principal Problem:   Unstable angina Active Problems:   Diabetes mellitus type 2, controlled   Hypothyroidism   Hyperlipidemia   Hypertension    Time spent: 35 minutes.     Niel Hummer A  [facility] Pager [phone]. If 7PM-7AM, please contact  night-coverage at [email or web address], password [facility] 11/24/2014, 2:28 PM  LOS: 0 days

## 2014-11-24 NOTE — Progress Notes (Signed)
With only 3cc air remaining in TR band, pt began bleeding, Level 1.  Replaced 3cc air.  Physician notified.

## 2014-11-24 NOTE — Progress Notes (Signed)
Paged Doreene Burke and no return phone call. Call on-call [facility] service and left message for MD to call and no return phone call at this time.

## 2014-11-24 NOTE — Progress Notes (Signed)
Inpatient Diabetes Program Recommendations  AACE/ADA: New Consensus Statement on Inpatient Glycemic Control (2013)  Target Ranges:  Prepandial:   less than 140 mg/dL      Peak postprandial:   less than 180 mg/dL (1-2 hours)      Critically ill patients:  140 - 180 mg/dL     Results for KHYRE, GERMOND (MRN [id number]) as of 11/24/2014 15:31  Ref. Range 11/24/2014 01:45 11/24/2014 06:52 11/24/2014 13:03  Glucose-Capillary Latest Range: 70-99 mg/dL 106 (H) 144 (H) 173 (H)     Chief Complaint: CP  History: DM, HTN  Home DM Meds: Insulin Pump (see below for settings)  Current DM Orders: Lantus 20 units QHS    Novolog Moderate SSI    **Spoke with patient in person this afternoon.  Patient A&O x4 and able to independently manage his insulin pump.  Pump was suspended upon my arrival to his room but was still connected at his body.  Patient does desire to use his insulin pump in the hospital and can have family members bring extra pump supplies as needed.  **Staff from cath lab arrived to patient's room ~3PM to take patient to cardiac cath.  Patient disconnected his insulin pump and RN to keep pump in patient's medication drawer on locked cart until he arrives back from cath lab.  Patient was instructed to resume his insulin pump upon arrival back from cath lab.  RN aware as well.  **Called Dr. Tyrell Antonio to discuss.  Permission given to me to d/c Lantus and Novolog orders and to place insulin pump orders so patient can manage blood sugars with his insulin pump in the hospital.  RN to document all CBGs with hospital meter and all insulin boluses given by patient per insulin pump.  RN to also complete Insulin Pump Assessment Qshift under flowsheets section of [facility] (EPIC).   Patient's insulin pump settings are as follows------  Basal Rates: 12AM- 1.85 units/hr 7AM- 2.5 units/hr 10AM- 2.8 units/hr 3PM- 2.9 units/hr  Patient receives a total of 60.55 units basal insulin per 24 hour period on  his insulin pump   Carbohydrate Ratio: 1 unit for every 6.5 grams of carbohydrates consumed   Correction/Sensitivity Factor: 1 units for every 20 mg/dl above target CBG of 120 mg/dl    Will follow while hospitalized Wyn Quaker RN, MSN, CDE Diabetes Coordinator Inpatient Diabetes Program Team Pager: [phone] (8a-5p)

## 2014-11-24 NOTE — ED Notes (Addendum)
Pt educated on expectations with insulin pump.  Pt told... 1.  Our glucometer used for CBG checks.  Pt to let us know if he feels he needs additional checks.   2.  Pt to dose on insulin pump, but must inform RN whenever dosages calculated/applied so RN can document and follow therapeutic use.   3. Pt able to tell team if bag becomes low, as we are able to order a back up bag for his pump.    Pt verbalized understanding.

## 2014-11-24 NOTE — ED Notes (Signed)
Per admitting MD Regalado, insulin pump turned off with the patients assistance.  Patient is NPO.

## 2014-11-24 NOTE — Interval H&P Note (Signed)
History and Physical Interval Note:  11/24/2014 3:24 PM  Andrew Oconnor  has presented today for surgery, with the diagnosis of cp  The various methods of treatment have been discussed with the patient and family. After consideration of risks, benefits and other options for treatment, the patient has consented to  Procedure(s): LEFT HEART CATHETERIZATION WITH CORONARY ANGIOGRAM (N/A) as a surgical intervention .  The patient's history has been reviewed, patient examined, no change in status, stable for surgery.  I have reviewed the patient's chart and labs.  Questions were answered to the patient's satisfaction.    Cath Lab Visit (complete for each Cath Lab visit)  Clinical Evaluation Leading to the Procedure:   ACS: Yes.    Non-ACS:    Anginal Classification: CCS IV  Anti-ischemic medical therapy: No Therapy  Non-Invasive Test Results: No non-invasive testing performed  Prior CABG: No previous CABG       Peter Martinique MD,FACC 11/24/2014 3:24 PM

## 2014-11-24 NOTE — H&P (Signed)
[facility] History and Physical  Andrew Oconnor HAL:[id number] DOB: 1956/04/30 DOA: 11/23/2014  Referring physician: ER physician. PCP: Haywood Pao, MD   Chief Complaint: Chest pain.  HPI: Andrew Oconnor is a 59 y.o. male with history of diabetes mellitus type 2 on insulin pump, hypothyroidism, hypertension, hyperlipidemia, OSA was brought to the ER after patient was complaining of chest pain. Patient started having chest pain while having his supper around 5 PM. Patient chest pain as retrosternal pressure-like. When his wife went to check on him he was unconscious for a few seconds. He woke up immediately and still had chest pain. Patient does not recall being unconscious. Patient also has been having some mild shortness of breath. Patient states over the last 2 days he has been having left arm pain when he exerts. Denies any vomiting palpitations. EMS came and gave nitroglycerin for which his chest pain resolved. At this time patient is chest pain-free. In the ER EKG and cardiac markers were unremarkable. Given his symptoms patient has been admitted for unstable angina.   Review of Systems: As presented in the history of presenting illness, rest negative.  Past Medical History  Diagnosis Date  . Thyroid disorder   . Depression   . Anxiety   . Diabetes mellitus without complication   . Hyperlipidemia   . Sleep apnea with use of continuous positive airway pressure (CPAP)     diagnsed AHi of 20 in 2010 , titrated to 13 cm H20.  Marland Kitchen History of diplopia      third nerve palsy 2013   . Hypertension   . GERD (gastroesophageal reflux disease)   . Hiatal hernia   . Carpal tunnel syndrome   . Obesity   . Colon polyp   . Erectile dysfunction    Past Surgical History  Procedure Laterality Date  . Hemorrectomy     Social History:  reports that he quit smoking about 12 years ago. He has never used smokeless tobacco. He reports that he drinks alcohol. He reports that he does not  use illicit drugs. Where does patient live home. Can patient participate in ADLs? Yes.  No Known Allergies  Family History:  Family History  Problem Relation Age of Onset  . Heart disease Mother   . Heart disease Father   . Diabetes Father   . Stroke Father   . Seizures Father   . Cancer Sister   . Heart disease Sister   . Heart disease Brother   . Diabetes Brother   . Heart disease Maternal Grandmother   . Heart disease Maternal Grandfather   . Heart disease Paternal Grandmother   . Heart disease Paternal Grandfather   . Stroke Other       Prior to Admission medications   Medication Sig Start Date End Date Taking? Authorizing Provider  amitriptyline (ELAVIL) 10 MG tablet Take 1 tablet (10 mg total) by mouth at bedtime as needed for sleep. 06/02/14  Yes Carmen Dohmeier, MD  benazepril (LOTENSIN) 10 MG tablet  01/16/13  Yes Historical Provider, MD  Blood Glucose Calibration (OT ULTRA/FASTTK CNTRL SOLN) SOLN  03/16/14  Yes Historical Provider, MD  citalopram (CELEXA) 40 MG tablet TAKE ONE HALF TABLET BY MOUTH EVERY DAY 07/27/14  Yes Asencion Partridge Dohmeier, MD  clonazePAM (KLONOPIN) 1 MG tablet Take 1.5 tablets (1.5 mg total) by mouth at bedtime. Patient taking differently: Take 0.5 mg by mouth at bedtime as needed for anxiety.  06/02/14  Yes Larey Seat, MD  levothyroxine (SYNTHROID,  LEVOTHROID) 200 MCG tablet  01/16/13  Yes Historical Provider, MD  Melatonin 5 MG TBDP Take 5 mg by mouth Nightly. 06/02/14  Yes Carmen Dohmeier, MD  NOVOLOG 100 UNIT/ML injection Inject 0-80 Units into the skin continuous. Insulin pump 01/01/13  Yes Historical Provider, MD  omeprazole (PRILOSEC) 20 MG capsule Take 40 mg by mouth daily.  01/16/13  Yes Historical Provider, MD  ONE TOUCH ULTRA TEST test strip  05/10/14  Yes Historical Provider, MD  pravastatin (PRAVACHOL) 40 MG tablet  01/16/13  Yes Historical Provider, MD  spironolactone-hydrochlorothiazide (ALDACTAZIDE) 25-25 MG per tablet  01/16/13  Yes Historical  Provider, MD    Physical Exam: Filed Vitals:   11/23/14 2330 11/23/14 2345 11/24/14 0000 11/24/14 0015  BP: 113/60 106/62 116/60 132/65  Pulse: 88 79 79 80  Temp:      TempSrc:      Resp: 12 14 14 11   SpO2: 97% 96% 97% 98%     General:  Well-developed and nourished.  Eyes: Anicteric no pallor.  ENT: No discharge from the ears eyes nose or mouth.  Neck: No mass felt.  Cardiovascular: S1 and S2 heard.  Respiratory: No rhonchi or crepitations.  Abdomen: Soft nontender bowel sounds present.  Skin: No rash.  Musculoskeletal: No edema.  Psychiatric: Appears normal.  Neurologic: Alert awake oriented to time place and person. Moves all extremities.  Labs on Admission:  Basic Metabolic Panel:  Recent Labs Lab 11/23/14 2057  NA 143  K 3.4*  CL 107  CO2 23  GLUCOSE 177*  BUN 9  CREATININE 0.78  CALCIUM 8.6   Liver Function Tests:  Recent Labs Lab 11/23/14 2057  AST 30  ALT 40  ALKPHOS 109  BILITOT 0.9  PROT 6.4  ALBUMIN 3.5    Recent Labs Lab 11/23/14 2057  LIPASE 24   No results for input(s): AMMONIA in the last 168 hours. CBC:  Recent Labs Lab 11/23/14 2057  WBC 7.6  HGB 15.7  HCT 43.2  MCV 86.7  PLT 250   Cardiac Enzymes: No results for input(s): CKTOTAL, CKMB, CKMBINDEX, TROPONINI in the last 168 hours.  BNP (last 3 results) No results for input(s): BNP in the last 8760 hours.  ProBNP (last 3 results) No results for input(s): PROBNP in the last 8760 hours.  CBG: No results for input(s): GLUCAP in the last 168 hours.  Radiological Exams on Admission: Dg Chest Port 1 View  11/23/2014   CLINICAL DATA:  Chest pain and dyspnea, 1 day duration  EXAM: PORTABLE CHEST - 1 VIEW  COMPARISON:  08/19/2008  FINDINGS: A single AP portable view of the chest demonstrates no focal airspace consolidation or alveolar edema. The lungs are grossly clear. There is no large effusion or pneumothorax. Cardiac and mediastinal contours appear unremarkable.   IMPRESSION: No active disease.   Electronically Signed   By: Andreas Newport M.D.   On: 11/23/2014 21:00    EKG: Independently reviewed. Normal sinus rhythm with nonspecific ST changes.  Assessment/Plan Principal Problem:   Unstable angina Active Problems:   Diabetes mellitus type 2, controlled   Hypothyroidism   Hyperlipidemia   Hypertension   1. Unstable angina - Patient is presently chest pain-free. Cycle cardiac markers. Check 2-D echo. Aspirin. Keep patient nothing by mouth after 5 AM in anticipation of possible cardiac procedures.  2.  syncope - may be related to his chest pain. Closely monitor in telemetry for any arrhythmia. Check 2-D echo. 3. Diabetes mellitus type 2 on insulin  pump - closely follow CBGs. 4. Hypertension - continue present medications. 5. Hyperlipidemia - continue statins. 6. OSA - on CPAP 7. Hypothyroidism on Synthroid.   DVT ProphylaxisLovenox. Code Status: Full code.  Family Communication: Patient's wife at the bedside.  Disposition Plan: Admit to inpatient.    Basya Casavant N. [facility] Pager [phone].  If 7PM-7AM, please contact night-coverage [email or web address] Password [facility] 11/24/2014, 12:35 AM

## 2014-11-24 NOTE — H&P (View-Only) (Signed)
CARDIOLOGY CONSULT NOTE     Patient ID: Andrew Oconnor MRN: [id number] DOB/AGE: 60/15/1957 60 y.o.  Admit date: 11/23/2014 Referring Physician Gean Birchwood MD Primary Physician Haywood Pao, MD Primary Cardiologist N/A Reason for Consultation Chest pain.  HPI: 59 yo WM we are requested to see by the hospitalist service for evaluation of chest pain. 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 He also has HTN, HL, OSA, and hypothyroidism. He presented to to ED last night with acute dyspnea associated with chest pressure. He described retrosternal pain to the admitting physician but denies any pain to me. He does complain of pain in his left arm over the last three days beginning in his left wrist and radiating to his shoulder. This is worse with activity and movement. His wife reported on admission that he briefly lost consciousness but he does not recall this. He does report an ETT about 10 years ago and a pharmacologic nuclear stress test about 5 years ago without significant findings.   Past Medical History  Diagnosis Date  . Thyroid disorder   . Depression   . Anxiety   . Diabetes mellitus without complication   . Hyperlipidemia   . Sleep apnea with use of continuous positive airway pressure (CPAP)     diagnsed AHi of 20 in 2010 , titrated to 13 cm H20.  Marland Kitchen History of diplopia      third nerve palsy 2013   . Hypertension   . GERD (gastroesophageal reflux disease)   . Hiatal hernia   . Carpal tunnel syndrome   . Obesity   . Colon polyp   . Erectile dysfunction     Family History  Problem Relation Age of Onset  . Heart disease Mother   . Heart disease Father   . Diabetes Father   . Stroke Father   . Seizures Father   . Cancer Sister   . Heart disease Sister   . Heart disease Brother   . Diabetes Brother   . Heart disease Maternal Grandmother   . Heart disease Maternal Grandfather   . Heart disease Paternal Grandmother   . Heart disease Paternal Grandfather   .  Stroke Other     History   Social History  . Marital Status: Married    Spouse Name: [name]  . Number of Children: 0  . Years of Education: 12   Occupational History  . retired     Water quality scientist   Social History Main Topics  . Smoking status: Former Smoker    Quit date: 09/09/2002  . Smokeless tobacco: Never Used     Comment: quit smoking 2004  . Alcohol Use: Yes     Comment: occas.  . Drug Use: No  . Sexual Activity: Not on file   Other Topics Concern  . Not on file   Social History Narrative    may continue treatment of complex apnea with his current CPAP setting , good residual AHI of 3.3/   Has improved by [facility] and has only once a night  nocturia , less shortness of breath.   gel mask fits well acc. to air leak data but has left a pressure mark.    He may need an alternative mask until it's healed. I recommended a swift FX medium pillows .   DME [employer] - machine , HCS with Nicole Kindred,  RT.      Patient is married [name]) and lives at home with his wife and step-son.  Patient is retired.   Patient has a high school.   Patient is right-handed.   Patient drinks 1-2 cups of coffee every morning.             Past Surgical History  Procedure Laterality Date  . Hemorrectomy         Medication List    ASK your doctor about these medications        amitriptyline 10 MG tablet  Commonly known as:  ELAVIL  Take 1 tablet (10 mg total) by mouth at bedtime as needed for sleep.     benazepril 10 MG tablet  Commonly known as:  LOTENSIN     citalopram 40 MG tablet  Commonly known as:  CELEXA  TAKE ONE HALF TABLET BY MOUTH EVERY DAY     clonazePAM 1 MG tablet  Commonly known as:  KLONOPIN  Take 1.5 tablets (1.5 mg total) by mouth at bedtime.     levothyroxine 200 MCG tablet  Commonly known as:  SYNTHROID, LEVOTHROID     Melatonin 5 MG Tbdp  Take 5 mg by mouth Nightly.     NOVOLOG 100 UNIT/ML injection  Generic drug:  insulin aspart  Inject 0-80  Units into the skin continuous. Insulin pump     omeprazole 20 MG capsule  Commonly known as:  PRILOSEC  Take 40 mg by mouth daily.     ONE TOUCH ULTRA TEST test strip  Generic drug:  glucose blood     OT ULTRA/FASTTK CNTRL SOLN Soln     pravastatin 40 MG tablet  Commonly known as:  PRAVACHOL     spironolactone-hydrochlorothiazide 25-25 MG per tablet  Commonly known as:  ALDACTAZIDE         ROS: As noted in HPI. All other systems are reviewed and are negative unless otherwise mentioned.   Physical Exam: Blood pressure 126/67, pulse 82, temperature 98.2 F (36.8 C), temperature source Oral, resp. rate 15, SpO2 98 %. Current Weight  06/02/14 154 lb (69.854 kg)  09/08/13 161 lb (73.029 kg)  08/23/13 161 lb 4 oz (73.143 kg)    GENERAL:  Well appearing WM in NAD HEENT:  PERRL, EOMI, sclera are clear. Oropharynx is clear. NECK:  No jugular venous distention, carotid upstroke brisk and symmetric, no bruits, no thyromegaly or adenopathy LUNGS:  Clear to auscultation bilaterally CHEST:  Unremarkable HEART:  RRR,  PMI not displaced or sustained,S1 and S2 within normal limits, no S3, no S4: no clicks, no rubs, no murmurs ABD:  Soft, nontender. BS +, no masses or bruits. No hepatomegaly, no splenomegaly EXT:  2 + pulses throughout, no edema, no cyanosis no clubbing SKIN:  Warm and dry.  No rashes NEURO:  Alert and oriented x 3. Cranial nerves II through XII intact. PSYCH:  Cognitively intact    Labs:   Lab Results  Component Value Date   WBC 7.7 11/24/2014   HGB 15.9 11/24/2014   HCT 43.2 11/24/2014   MCV 86.9 11/24/2014   PLT 252 11/24/2014    Recent Labs Lab 11/24/14 0729  NA 139  K 3.7  CL 106  CO2 25  BUN 11  CREATININE 0.78  CALCIUM 8.7  PROT 6.7  BILITOT 1.5*  ALKPHOS 109  ALT 38  AST 25  GLUCOSE 171*   Lab Results  Component Value Date   TROPONINI <0.03 11/24/2014   TROPONINI <0.03 11/24/2014   No results found for: CHOL No results found for:  HDL No results found for: [facility]  No results found for: TRIG No results found for: CHOLHDL No results found for: LDLDIRECT  No results found for: PROBNP Lab Results  Component Value Date   TSH 0.050* 11/24/2014   No results found for: HGBA1C  Radiology: Dg Chest Port 1 View  11/23/2014   CLINICAL DATA:  Chest pain and dyspnea, 1 day duration  EXAM: PORTABLE CHEST - 1 VIEW  COMPARISON:  08/19/2008  FINDINGS: A single AP portable view of the chest demonstrates no focal airspace consolidation or alveolar edema. The lungs are grossly clear. There is no large effusion or pneumothorax. Cardiac and mediastinal contours appear unremarkable.  IMPRESSION: No active disease.   Electronically Signed   By: Andreas Newport M.D.   On: 11/23/2014 21:00    EKG: NSR with T wave inversion consistent with inferior wall ischemia. This was compared to an Ecg done at [place] on 04/07/12 and the T wave changes are new.   ASSESSMENT AND PLAN:  1. Unstable angina. Symptoms are somewhat atypical but certainly concerning for USAP. He has new T wave changes inferiorly. He has normal d-dimer and troponins. He has multiple cardiac risk factors.  2. IDDM on insulin pump. 3. HTN 4. Hyperlipidemia. On statin 5. OSA with disordered sleep. Uses CPAP 6. Hypothyroidism  I have recommended proceeding directly to cardiac cath given high suspicion of acute coronary syndrome. The procedure and risks were reviewed including but not limited to death, myocardial infarction, stroke, arrythmias, bleeding, transfusion, emergency surgery, dye allergy, or renal dysfunction. The patient voices understanding and is agreeable to proceed. Will add to schedule this afternoon. Keep NPO  Signed: Earma Nicolaou [place], [place]  11/24/2014, 12:24 PM

## 2014-11-24 NOTE — CV Procedure (Signed)
    Cardiac Catheterization Procedure Note  Name: Andrew Oconnor MRN: [id number] DOB: 1955-11-17  Procedure: Left Heart Cath, Selective Coronary Angiography, LV angiography  Indication: 59 yo WM presents with acute dyspnea and chest pain concerning for USAP.   Procedural Details: The right wrist was prepped, draped, and anesthetized with 1% lidocaine. Using the modified Seldinger technique, a 6 French slender sheath was introduced into the right radial artery. 3 mg of verapamil was administered through the sheath, weight-based unfractionated heparin was administered intravenously. Standard Judkins catheters were used for selective coronary angiography and left ventriculography. Catheter exchanges were performed over an exchange length guidewire. There were no immediate procedural complications. A TR band was used for radial hemostasis at the completion of the procedure.  The patient was transferred to the post catheterization recovery area for further monitoring.  Procedural Findings: Hemodynamics: AO 156/78 mean 114 mm Hg LV 166/32 mm Hg  Coronary angiography: Coronary dominance: left  Left mainstem:   Left anterior descending (LAD): Normal   Left circumflex (LCx): Normal  Right coronary artery (RCA): Normal  Left ventriculography: Left ventricular systolic function is normal, LVEF is estimated at 55-65%, there is no significant mitral regurgitation. The proximal aorta is mildly dilated.   Final Conclusions:   1. Normal coronary anatomy. 2. Normal LV function 3. Elevated LV EDP  Recommendations: etiology of his elevated EDP is unclear. Will order an Echocardiogram to assess valvular and diastolic function. Will give IV lasix x 1. May benefit from oral diuretics long term.  Peter Martinique, [place]  11/24/2014, 3:56 PM

## 2014-11-24 NOTE — Progress Notes (Signed)
Patient declines the use of nocturnal CPAP tonight. He states he did "fine last night" and requests to just remain "reclined" as he is now for sleep. HOB is currently approximately 30 degrees. VSS on room air. RT will continue to follow.

## 2014-11-25 ENCOUNTER — Encounter (HOSPITAL_COMMUNITY): Payer: Self-pay | Admitting: Cardiology

## 2014-11-25 DIAGNOSIS — I5032 Chronic diastolic (congestive) heart failure: Secondary | ICD-10-CM

## 2014-11-25 DIAGNOSIS — R0600 Dyspnea, unspecified: Secondary | ICD-10-CM

## 2014-11-25 LAB — GLUCOSE, CAPILLARY
Glucose-Capillary: 54 mg/dL — ABNORMAL LOW (ref 70–99)
Glucose-Capillary: 109 mg/dL — ABNORMAL HIGH (ref 70–99)
Glucose-Capillary: 188 mg/dL — ABNORMAL HIGH (ref 70–99)
Glucose-Capillary: 115 mg/dL — ABNORMAL HIGH (ref 70–99)

## 2014-11-25 LAB — CBC
HCT: 43.3 % (ref 39.0–52.0)
Hemoglobin: 15.6 g/dL (ref 13.0–17.0)
MCH: 31.5 pg (ref 26.0–34.0)
MCHC: 36 g/dL (ref 30.0–36.0)
MCV: 87.5 fL (ref 78.0–100.0)
Platelets: 251 10*3/uL (ref 150–400)
RBC: 4.95 MIL/uL (ref 4.22–5.81)
RDW: 12.7 % (ref 11.5–15.5)
WBC: 10.6 10*3/uL — ABNORMAL HIGH (ref 4.0–10.5)

## 2014-11-25 LAB — BASIC METABOLIC PANEL
Anion gap: 9 (ref 5–15)
BUN: 10 mg/dL (ref 6–23)
CO2: 26 mmol/L (ref 19–32)
Calcium: 8.4 mg/dL (ref 8.4–10.5)
Chloride: 104 mmol/L (ref 96–112)
Creatinine, Ser: 0.71 mg/dL (ref 0.50–1.35)
GFR calc Af Amer: >90 mL/min (ref >90)
GFR calc non Af Amer: >90 mL/min (ref >90)
Glucose, Bld: 47 mg/dL — ABNORMAL LOW (ref 70–99)
Potassium: 3.3 mmol/L — ABNORMAL LOW (ref 3.5–5.1)
Sodium: 139 mmol/L (ref 135–145)

## 2014-11-25 LAB — BRAIN NATRIURETIC PEPTIDE: B Natriuretic Peptide: 226.2 pg/mL — ABNORMAL HIGH (ref 0.0–100.0)

## 2014-11-25 MED ORDER — POTASSIUM CHLORIDE CRYS ER 20 MEQ PO TBCR
40.0000 meq | EXTENDED_RELEASE_TABLET | Freq: Once | ORAL | Status: AC
  Administered 2014-11-25: 40 meq via ORAL
  Filled 2014-11-25: qty 2

## 2014-11-25 MED ORDER — METOPROLOL SUCCINATE ER 25 MG PO TB24: 25.0000 mg | ORAL_TABLET | Freq: Every day | ORAL | Status: DC

## 2014-11-25 MED ORDER — METOPROLOL SUCCINATE ER 25 MG PO TB24
25.0000 mg | ORAL_TABLET | Freq: Every day | ORAL | Status: DC
  Administered 2014-11-25: 25 mg via ORAL
  Filled 2014-11-25: qty 1

## 2014-11-25 MED ORDER — ASPIRIN 325 MG PO TBEC: 325.0000 mg | DELAYED_RELEASE_TABLET | Freq: Every day | ORAL | Status: DC

## 2014-11-25 NOTE — Progress Notes (Signed)
D/C'd IV,  D/c'd telemetry.  D/C'd pt.  Educated pt on CHF, and gave him the Living with CHF booklet.  Explained discharge instructions to him, and he had no further questions at this time.  Pt in no acute distress.

## 2014-11-25 NOTE — Progress Notes (Signed)
Pt. With CBG of 54 this am. Orange juice given. CBG now 109. No distress noted. MD, Regalado, made aware. No new orders received. RN will continue to monitor pt. For changes in condition. Tjuana Vickrey, Katherine Roan

## 2014-11-25 NOTE — Discharge Summary (Signed)
Physician Discharge Summary  Andrew Oconnor JAS:[id number] DOB: [birth year] DOA: 11/23/2014  PCP: Haywood Pao, MD  Admit date: 11/23/2014 Discharge date: 11/25/2014  Time spent: 35 minutes  Recommendations for Outpatient Follow-up:  Need B-met to follow renal function.  Start diuretic if BP allows it.   Discharge Diagnoses:    Diastolic HF   Chest pain might be related to HF.   Syncope.    Diabetes mellitus type 2, controlled   Hypothyroidism   Hyperlipidemia   Hypertension   Chronic diastolic heart failure   Discharge Condition: stable.   Diet recommendation: Heart healthy  Filed Weights   11/24/14 1411 11/25/14 0535  Weight: 68.1 kg (150 lb 2.1 oz) 66.679 kg (147 lb)    History of present illness:  59 yo WM we are requested to see by the hospitalist service for evaluation of chest pain. 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 He also has HTN, HL, OSA, and hypothyroidism. He presented to to ED last night with acute dyspnea associated with chest pressure. He described retrosternal pain to the admitting physician but denies any pain to me. He does complain of pain in his left arm over the last three days beginning in his left wrist and radiating to his shoulder. This is worse with activity and movement. His wife reported on admission that he briefly lost consciousness but he does not recall this. He does report an ETT about 10 years ago and a pharmacologic nuclear stress test about 5 years ago without significant findings.    Hospital Course:  1. Chest pain;  - cardiology consulted. On aspirin, statin. Cath with normal CAD. Might be related to HF.   2. Syncope - may be related to his chest pain. Closely monitor in telemetry for any arrhythmia. Check 2-D echo with normal EF. Neuro exam non focal.  3. Diabetes mellitus type 2 on insulin pump at home; 4. Hypertension - lisinopril and metoprolol  5. Hyperlipidemia - continue statins. 6. OSA - on  CPAP 7. Hypothyroidism on Synthroid. 8. Diastolic HF; Cath with elevated LV EDP.  appears compensated. Started on metoprolol, continue with lisinopril. Hold spironolactone to avoid Hypotension. Follow up with PCP and cardio for consideration of diuretics.   Procedures: Cath; Final Conclusions:  1. Normal coronary anatomy. 2. Normal LV function  3. Elevated LV EDP  Consultations:  Cardiology  Discharge Exam: Filed Vitals:   11/25/14 1105  BP: 129/70  Pulse: 82  Temp:   Resp:     General: Alert in no distress.  Cardiovascular: S 1, S 2 RRR Respiratory: CTA  Discharge Instructions   Discharge Instructions    Diet - low sodium heart healthy    Complete by:  As directed      Increase activity slowly    Complete by:  As directed           Current Discharge Medication List    START taking these medications   Details  aspirin EC 325 MG EC tablet Take 1 tablet (325 mg total) by mouth daily. Qty: 30 tablet, Refills: 0    metoprolol succinate (TOPROL-XL) 25 MG 24 hr tablet Take 1 tablet (25 mg total) by mouth daily. Qty: 30 tablet, Refills: 0      CONTINUE these medications which have NOT CHANGED   Details  amitriptyline (ELAVIL) 10 MG tablet Take 1 tablet (10 mg total) by mouth at bedtime as needed for sleep. Qty: 90 tablet, Refills: 3    benazepril (LOTENSIN) 10  MG tablet     Blood Glucose Calibration (OT ULTRA/FASTTK CNTRL SOLN) SOLN     citalopram (CELEXA) 40 MG tablet TAKE ONE HALF TABLET BY MOUTH EVERY DAY Qty: 45 tablet, Refills: 3    clonazePAM (KLONOPIN) 1 MG tablet Take 1.5 tablets (1.5 mg total) by mouth at bedtime. Qty: 45 tablet, Refills: 5   Associated Diagnoses: REM behavioral disorder    levothyroxine (SYNTHROID, LEVOTHROID) 200 MCG tablet     Melatonin 5 MG TBDP Take 5 mg by mouth Nightly. Qty: 30 tablet, Refills: 11   Associated Diagnoses: REM behavioral disorder    NOVOLOG 100 UNIT/ML injection Inject 0-80 Units into the skin  continuous. Insulin pump    omeprazole (PRILOSEC) 20 MG capsule Take 40 mg by mouth daily.     ONE TOUCH ULTRA TEST test strip     pravastatin (PRAVACHOL) 40 MG tablet       STOP taking these medications     spironolactone-hydrochlorothiazide (ALDACTAZIDE) 25-25 MG per tablet        No Known Allergies Follow-up Information    Follow up with TISOVEC,RICHARD W, MD In 1 week.   Specialty:  Internal Medicine   Contact information:   [address] [place] [address] [phone]       Follow up with Peter Jordan, MD In 1 week.   Specialty:  Cardiology   Contact information:   [address] [place] [address] [phone]        The results of significant diagnostics from this hospitalization (including imaging, microbiology, ancillary and laboratory) are listed below for reference.    Significant Diagnostic Studies: Dg Chest Port 1 View  11/23/2014   CLINICAL DATA:  Chest pain and dyspnea, 1 day duration  EXAM: PORTABLE CHEST - 1 VIEW  COMPARISON:  08/19/2008  FINDINGS: A single AP portable view of the chest demonstrates no focal airspace consolidation or alveolar edema. The lungs are grossly clear. There is no large effusion or pneumothorax. Cardiac and mediastinal contours appear unremarkable.  IMPRESSION: No active disease.   Electronically Signed   By: Daniel R Mitchell M.D.   On: 11/23/2014 21:00    Microbiology: No results found for this or any previous visit (from the past 240 hour(s)).   Labs: Basic Metabolic Panel:  Recent Labs Lab 11/23/14 2057 11/24/14 0130 11/24/14 0729 11/24/14 1857 11/25/14 0408  NA 143  --  139  --  139  K 3.4*  --  3.7  --  3.3*  CL 107  --  106  --  104  CO2 23  --  25  --  26  GLUCOSE 177*  --  171*  --  47*  BUN 9  --  11  --  10  CREATININE 0.78 0.72 0.78 0.84 0.71  CALCIUM 8.6  --  8.7  --  8.4  MG  --  1.9  --   --   --    Liver Function Tests:  Recent Labs Lab 11/23/14 2057 11/24/14 0729   AST 30 25  ALT 40 38  ALKPHOS 109 109  BILITOT 0.9 1.5*  PROT 6.4 6.7  ALBUMIN 3.5 3.5    Recent Labs Lab 11/23/14 2057  LIPASE 24   No results for input(s): AMMONIA in the last 168 hours. CBC:  Recent Labs Lab 11/23/14 2057 11/24/14 0130 11/24/14 1857 11/25/14 0408  WBC 7.6 7.7 14.9* 10.6*  HGB 15.7 15.9 16.8 15.6  HCT 43.2 43.2 45.6 43.3  MCV 86.7   86.9 87.7 87.5  PLT 250 252 258 251   Cardiac Enzymes:  Recent Labs Lab 11/24/14 0130 11/24/14 1330  TROPONINI <0.03 <0.03   BNP: BNP (last 3 results) No results for input(s): BNP in the last 8760 hours.  ProBNP (last 3 results) No results for input(s): PROBNP in the last 8760 hours.  CBG:  Recent Labs Lab 11/24/14 2117 11/25/14 0551 11/25/14 0640 11/25/14 0932 11/25/14 1216  GLUCAP 146* 54* 109* 188* 115*       Signed:  Regalado, Belkys A  [facility] 11/25/2014, 12:19 PM    

## 2014-11-25 NOTE — Evaluation (Addendum)
Physical Therapy Evaluation Patient Details Name: Andrew Oconnor MRN: [id number] DOB: 1956-05-03 Today's Date: 11/25/2014   History of Present Illness  Andrew Oconnor is a 59 y.o. male with history of diabetes mellitus type 2 on insulin pump, hypothyroidism, hypertension, hyperlipidemia, OSA was brought to the ER after patient was complaining of chest pain. Patient started having chest pain while having his supper around 5 PM. Patient chest pain as retrosternal pressure-like. When his wife went to check on him he was unconscious for a few seconds. He woke up immediately and still had chest pain. Patient does not recall being unconscious. Patient also has been having some mild shortness of breath. Patient states over the last 2 days he has been having left arm pain when he exerts. Denies any vomiting palpitations. EMS came and gave nitroglycerin for which his chest pain resolved. At this time patient is chest pain-free. In the ER EKG and cardiac markers were unremarkable. Given his symptoms patient has been admitted for unstable angina.   Clinical Impression  Pt at baseline function.  Scored 24/24 on DGI and pt felt well after completing the moderate exertional activity with acceptable EHR 96 bpm.  No further PT needs.  Will sign off.    Follow Up Recommendations No PT follow up    Equipment Recommendations  None recommended by PT    Recommendations for Other Services       Precautions / Restrictions Precautions Precautions: None      Mobility  Bed Mobility Overal bed mobility: Modified Independent                Transfers Overall transfer level: Independent                  Ambulation/Gait Ambulation/Gait assistance: Independent Ambulation Distance (Feet): 500 Feet Assistive device: None Gait Pattern/deviations: WFL(Within Functional Limits)   Gait velocity interpretation: at or above normal speed for age/gender General Gait Details: fluid gait, generally  steady  Stairs Stairs: Yes Stairs assistance: Modified independent (Device/Increase time) Stair Management: No rails;One rail Right;Alternating pattern;Forwards Number of Stairs: 5 General stair comments: safe with/without rails  Wheelchair Mobility    Modified Rankin (Stroke Patients Only)       Balance Overall balance assessment: Independent                               Standardized Balance Assessment Standardized Balance Assessment : Dynamic Gait Index   Dynamic Gait Index Level Surface: Normal Change in Gait Speed: Normal Gait with Horizontal Head Turns: Normal Gait with Vertical Head Turns: Normal Gait and Pivot Turn: Normal Step Over Obstacle: Normal Step Around Obstacles: Normal Steps: Mild Impairment Total Score: 23       Pertinent Vitals/Pain Pain Assessment: No/denies pain    Home Living Family/patient expects to be discharged to:: Private residence Living Arrangements: Spouse/significant other Available Help at Discharge: Family Type of Home: Mobile home Home Access: Stairs to enter Entrance Stairs-Rails: Psychiatric nurse of Steps: 5 Home Layout: One level Home Equipment: None      Prior Function Level of Independence: Independent               Hand Dominance        Extremity/Trunk Assessment   Upper Extremity Assessment: Overall WFL for tasks assessed (mild grip and tricep weakness L> R)           Lower Extremity Assessment: Overall WFL for tasks  assessed      Cervical / Trunk Assessment: Normal  Communication   Communication: No difficulties  Cognition Arousal/Alertness: Awake/alert Behavior During Therapy: WFL for tasks assessed/performed Overall Cognitive Status: Within Functional Limits for tasks assessed                      General Comments General comments (skin integrity, edema, etc.): SpO2 in upper 90's, EHR 96 bpm during moderately strenuous activity.   No SOB, pt stated  felt well    Exercises        Assessment/Plan    PT Assessment Patent does not need any further PT services  PT Diagnosis     PT Problem List    PT Treatment Interventions     PT Goals (Current goals can be found in the Care Plan section) Acute Rehab PT Goals PT Goal Formulation: All assessment and education complete, DC therapy    Frequency     Barriers to discharge        Co-evaluation               End of Session   Activity Tolerance: Patient tolerated treatment well Patient left: in bed;with call bell/phone within reach Nurse Communication: Mobility status    Functional Assessment Tool Used: clinical judgement Functional Limitation: Mobility: Walking and moving around Mobility: Walking and Moving Around Current Status [phone]): 0 percent impaired, limited or restricted Mobility: Walking and Moving Around Goal Status [phone]): 0 percent impaired, limited or restricted Mobility: Walking and Moving Around Discharge Status [phone]): 0 percent impaired, limited or restricted    Time: 1135-1151 PT Time Calculation (min) (ACUTE ONLY): 16 min   Charges:   PT Evaluation $Initial PT Evaluation Tier I: 1 Procedure     PT G Codes:   PT G-Codes **NOT FOR INPATIENT CLASS** Functional Assessment Tool Used: clinical judgement Functional Limitation: Mobility: Walking and moving around Mobility: Walking and Moving Around Current Status (P8099): 0 percent impaired, limited or restricted Mobility: Walking and Moving Around Goal Status (I3382): 0 percent impaired, limited or restricted Mobility: Walking and Moving Around Discharge Status (N0539): 0 percent impaired, limited or restricted    Solomia Harrell, Tessie Fass 11/25/2014, 12:08 PM    11/25/2014  Donnella Sham, PT [phone]  (pager)

## 2014-11-25 NOTE — Progress Notes (Signed)
       Patient Name: Andrew Oconnor Date of Encounter: 11/25/2014    SUBJECTIVE: The patient is ruled out for myocardial infarction. Coronary angiography did not demonstrate any significant obstruction. He did have significant elevation in LVEDP.  TELEMETRY:  Normal sinus rhythm. Filed Vitals:   11/24/14 2109 11/25/14 0111 11/25/14 0535 11/25/14 0930  BP: 133/73 111/55 105/82 133/67  Pulse: 76 76 74 78  Temp: 98.1 F (36.7 C) 98 F (36.7 C) 97.8 F (36.6 C)   TempSrc: Oral Oral Oral   Resp: 18 16 16    Height:      Weight:   147 lb (66.679 kg)   SpO2: 97% 97% 98%     Intake/Output Summary (Last 24 hours) at 11/25/14 1015 Last data filed at 11/25/14 0917  Gross per 24 hour  Intake 1339.26 ml  Output   2200 ml  Net -860.74 ml   LABS: Basic Metabolic Panel:  Recent Labs  11/24/14 0130 11/24/14 0729 11/24/14 1857 11/25/14 0408  NA  --  139  --  139  K  --  3.7  --  3.3*  CL  --  106  --  104  CO2  --  25  --  26  GLUCOSE  --  171*  --  47*  BUN  --  11  --  10  CREATININE 0.72 0.78 0.84 0.71  CALCIUM  --  8.7  --  8.4  MG 1.9  --   --   --    CBC:  Recent Labs  11/24/14 1857 11/25/14 0408  WBC 14.9* 10.6*  HGB 16.8 15.6  HCT 45.6 43.3  MCV 87.7 87.5  PLT 258 251   Cardiac Enzymes:  Recent Labs  11/24/14 0130 11/24/14 1330  TROPONINI <0.03 <0.03     Radiology/Studies:  Chest x-ray revealed no evidence of CHF  Physical Exam: Blood pressure 133/67, pulse 78, temperature 97.8 F (36.6 C), temperature source Oral, resp. rate 16, height 4\' 7"  (1.397 m), weight 147 lb (66.679 kg), SpO2 98 %. Weight change:   Wt Readings from Last 3 Encounters:  11/25/14 147 lb (66.679 kg)  06/02/14 154 lb (69.854 kg)  09/08/13 161 lb (73.029 kg)   Exam is unremarkable and cath site reveals no bleeding or other local abnormality. He is able lie flat in bed Cardiac exam is normal  ASSESSMENT:  1. Acute diastolic heart failure, uncertain etiology. No  evidence of ischemic heart disease based upon widely patent epicardial coronaries. In this diabetic, microvascular disease would be a consideration. 2. Diabetes mellitus, insulin-dependent 3. Essential hypertension, needs aggressive control 4. Hyperlipidemia, needs aggressive control  Plan:  1. Echocardiogram is pending 2. Management from cardiovascular standpoint will be medication. Diastolic heart failure can improve symptomatically with beta blocker or negatively inotropic calcium channel blocker therapy.  3. Aggressive blood pressure and lipid management 4. Assuming no surprises on echo, the patient can be discharged and followed up by primary care  Signed, Sinclair Grooms 11/25/2014, 10:15 AM

## 2014-11-25 NOTE — Progress Notes (Signed)
  Echocardiogram 2D Echocardiogram has been performed.  Andrew Oconnor 11/25/2014, 10:51 AM

## 2014-11-28 NOTE — Progress Notes (Addendum)
Utilization review complete. Faheem Ziemann RN CCM Case Mgmt phone [phone] 

## 2014-12-01 ENCOUNTER — Ambulatory Visit: Payer: Medicare PPO | Admitting: Adult Health

## 2014-12-05 ENCOUNTER — Ambulatory Visit (INDEPENDENT_AMBULATORY_CARE_PROVIDER_SITE_OTHER): Payer: Medicare PPO | Admitting: Cardiology

## 2014-12-05 ENCOUNTER — Encounter: Payer: Self-pay | Admitting: Cardiology

## 2014-12-05 VITALS — BP 114/62 | HR 62 | Ht <= 58 in | Wt 153.2 lb

## 2014-12-05 DIAGNOSIS — I5032 Chronic diastolic (congestive) heart failure: Secondary | ICD-10-CM | POA: Diagnosis not present

## 2014-12-05 DIAGNOSIS — E876 Hypokalemia: Secondary | ICD-10-CM

## 2014-12-05 DIAGNOSIS — E0590 Thyrotoxicosis, unspecified without thyrotoxic crisis or storm: Secondary | ICD-10-CM | POA: Diagnosis not present

## 2014-12-05 LAB — BASIC METABOLIC PANEL
BUN: 14 mg/dL (ref 6–23)
CO2: 28 mEq/L (ref 19–32)
Calcium: 9.1 mg/dL (ref 8.4–10.5)
Chloride: 97 mEq/L (ref 96–112)
Creat: 0.88 mg/dL (ref 0.50–1.35)
Glucose, Bld: 299 mg/dL — ABNORMAL HIGH (ref 70–99)
Potassium: 4.1 mEq/L (ref 3.5–5.3)
Sodium: 134 mEq/L — ABNORMAL LOW (ref 135–145)

## 2014-12-05 LAB — TSH: TSH: 0.046 u[IU]/mL — ABNORMAL LOW (ref 0.350–4.500)

## 2014-12-05 LAB — T4, FREE: Free T4: 1.54 ng/dL (ref 0.80–1.80)

## 2014-12-05 MED ORDER — SPIRONOLACTONE-HCTZ 50-50 MG PO TABS: 1.0000 | ORAL_TABLET | Freq: Every day | ORAL | Status: DC

## 2014-12-05 NOTE — Patient Instructions (Signed)
Please do labs today- TSH , BMP,FREE T4  INCREASE SPIRONOLACTONE- HCTZ 50/50 ONE TABLET DAILY. DO NOT STATRT UNTIL OFFICE CONTACT YOU ABOUT YOUR LABS.   Your physician recommends that you schedule a follow-up appointment in 2-3 WEEKS with Mickel Baas or another extender during that timeframe.

## 2014-12-05 NOTE — Progress Notes (Signed)
Cardiology Office Note   Date:  12/05/2014   ID:  Andrew Oconnor, DOB 12-18-1955, MRN [id number]  PCP:  Haywood Pao, MD  Cardiologist:    Dr. Martinique  Chief Complaint  Patient presents with  . Hospitalization Follow-up    has gained 5 pounds since hosp.       History of Present Illness: Andrew Oconnor is a 59 y.o. male who presents for follow up after hospitalization for chest pain and diastolic HF.    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 He also has HTN, HL, OSA, and hypothyroidism.  He underwent cardiac cath with normal anatomy and normal LV function.  EDP was elevated. Echo with EF 76-72% LV diastolic function normal.    Today he complains of SOB and weight gain.  SOB with rest and exertion. No chest pain.  We discussed results of his cath.  His TSH was low at 0.050 - will recheck today and send results to Dr. Osborne Casco.    Past Medical History  Diagnosis Date  . Thyroid disorder   . Depression   . Anxiety   . Diabetes mellitus without complication   . Hyperlipidemia   . Sleep apnea with use of continuous positive airway pressure (CPAP)     diagnsed AHi of 20 in 2010 , titrated to 13 cm H20.  Marland Kitchen History of diplopia      third nerve palsy 2013   . Hypertension   . GERD (gastroesophageal reflux disease)   . Hiatal hernia   . Carpal tunnel syndrome   . Obesity   . Colon polyp   . Erectile dysfunction     Past Surgical History  Procedure Laterality Date  . Hemorrectomy    . Left heart catheterization with coronary angiogram N/A 11/24/2014    Procedure: LEFT HEART CATHETERIZATION WITH CORONARY ANGIOGRAM;  Surgeon: Peter M Martinique, MD;  Location: [facility] CATH LAB;  Service: Cardiovascular;  Laterality: N/A;     Current Outpatient Prescriptions  Medication Sig Dispense Refill  . amitriptyline (ELAVIL) 10 MG tablet Take 1 tablet (10 mg total) by mouth at bedtime as needed for sleep. 90 tablet 3  . aspirin EC 325 MG EC tablet Take 1 tablet (325 mg total) by  mouth daily. 30 tablet 0  . benazepril (LOTENSIN) 10 MG tablet     . Blood Glucose Calibration (OT ULTRA/FASTTK CNTRL SOLN) SOLN     . citalopram (CELEXA) 40 MG tablet TAKE ONE HALF TABLET BY MOUTH EVERY DAY 45 tablet 3  . clonazePAM (KLONOPIN) 1 MG tablet Take 1.5 tablets (1.5 mg total) by mouth at bedtime. (Patient taking differently: Take 0.5 mg by mouth at bedtime as needed for anxiety. ) 45 tablet 5  . levothyroxine (SYNTHROID, LEVOTHROID) 200 MCG tablet     . Melatonin 5 MG TBDP Take 5 mg by mouth Nightly. 30 tablet 11  . metoprolol succinate (TOPROL-XL) 25 MG 24 hr tablet Take 1 tablet (25 mg total) by mouth daily. 30 tablet 0  . NOVOLOG 100 UNIT/ML injection Inject 0-80 Units into the skin continuous. Insulin pump    . omeprazole (PRILOSEC) 20 MG capsule Take 40 mg by mouth daily.     . ONE TOUCH ULTRA TEST test strip     . pravastatin (PRAVACHOL) 40 MG tablet     . spironolactone-hydrochlorothiazide (ALDACTAZIDE) 25-25 MG per tablet Take 1 tablet by mouth daily.     No current facility-administered medications for this visit.    Allergies:  Review of patient's allergies indicates no known allergies.    Social History:  The patient  reports that he quit smoking about 12 years ago. He has never used smokeless tobacco. He reports that he drinks alcohol. He reports that he does not use illicit drugs.   Family History:  The patient'sfamily history includes Cancer in his sister; Diabetes in his brother and father; Heart disease in his brother, father, maternal grandfather, maternal grandmother, mother, paternal grandfather, paternal grandmother, and sister; Seizures in his father; Stroke in his father and other.    ROS:  General:no colds or fevers, inc. weight 6 lbs, but with SOB as well Skin:no rashes or ulcers HEENT:no blurred vision, no congestion CV:see HPI PUL:see HPI GI:no diarrhea constipation or melena, no indigestion GU:no hematuria, no dysuria MS:no joint pain, no  claudication Neuro:no syncope, no lightheadedness Endo:+ diabetes, + thyroid disease  Wt Readings from Last 3 Encounters:  12/05/14 153 lb 3.2 oz (69.491 kg)  11/25/14 147 lb (66.679 kg)  06/02/14 154 lb (69.854 kg)     PHYSICAL EXAM: VS:  BP 114/62 mmHg  Pulse 62  Ht 4\' 7"  (1.397 m)  Wt 153 lb 3.2 oz (69.491 kg)  BMI 35.61 kg/m2 , BMI Body mass index is 35.61 kg/(m^2). General:Pleasant affect, NAD Skin:Warm and dry, brisk capillary refill HEENT:normocephalic, sclera clear, mucus membranes moist Neck:supple, no JVD, no bruits  Heart:S1S2 RRR without murmur, gallup, rub or click Lungs:clear without rales, rhonchi, or wheezes ERD:EYCX, non tender, + BS, do not palpate liver spleen or masses Ext:no lower ext edema, 2+ pedal pulses, 2+ radial pulses Neuro:alert and oriented X 3, MAE, follows commands, + facial symmetry    EKG:  EKG is not  ordered today.    Recent Labs: 11/24/2014: ALT 38; Magnesium 1.9; TSH 0.050* 11/25/2014: B Natriuretic Peptide 226.2*; BUN 10; Creatinine 0.71; Hemoglobin 15.6; Platelets 251; Potassium 3.3*; Sodium 139    Lipid Panel No results found for: CHOL, TRIG, HDL, CHOLHDL, VLDL, LDLCALC, LDLDIRECT     Other studies Reviewed: Additional studies/ records that were reviewed today include: hospital note, labs .   ASSESSMENT AND PLAN:  Diastolic HF- not seen on echo but with cath elevated LV EDP, lasix given with good results   Chest pain might be related to HF. Resolved, normal coronary arteries   Syncope. no further episodes   Diabetes mellitus type 2, controlled followed by PCP   Hypothyroidism though low TSH with hospitalization will follow up labs today and send results to PCP.   Hyperlipidemia- followed by PCP on pravastatin   Hypertension controlled   Chronic diastolic heart failure today with SOB, wt increase.  Will change spironolactone/HCTZ to 50/50 daily and recheck labs today, before he increases we will call him  results.  He did have normal d-dimer during hospitalization.   Follow up in 2 weeks with APP - if no improvement may need to change HCTZ to lasix  Hypokalemia recheck labs.    Current medicines are reviewed with the patient today.  The patient Has no concerns regarding medicines.  The following changes have been made:  See above Labs/ tests ordered today include:see above  Disposition:   FU:  see above  Signed, Isaiah Serge, NP  12/05/2014 8:09 AM    [facility] [place], [place], [place] [address] [place], [place] Phone: [phone]; Fax: [phone]

## 2014-12-06 ENCOUNTER — Telehealth: Payer: Self-pay | Admitting: *Deleted

## 2014-12-06 NOTE — Telephone Encounter (Signed)
Spoke to patient. Result given . Verbalized understanding Continue medication, watch salt intake and weight daily If gain >3 pounds contact office.

## 2014-12-06 NOTE — Telephone Encounter (Signed)
-----   Message from Isaiah Serge, NP sent at 12/05/2014  8:42 PM EDT ----- Thyroid still abnormal follow up with PCP.  Glucose was elevated.but other labs stable.

## 2014-12-14 ENCOUNTER — Ambulatory Visit: Payer: Medicare PPO | Admitting: Adult Health

## 2014-12-30 ENCOUNTER — Encounter: Payer: Self-pay | Admitting: Internal Medicine

## 2015-01-03 ENCOUNTER — Ambulatory Visit (INDEPENDENT_AMBULATORY_CARE_PROVIDER_SITE_OTHER): Payer: Medicare PPO | Admitting: Cardiology

## 2015-01-03 ENCOUNTER — Other Ambulatory Visit: Payer: Self-pay | Admitting: *Deleted

## 2015-01-03 ENCOUNTER — Encounter: Payer: Self-pay | Admitting: Cardiology

## 2015-01-03 VITALS — BP 124/62 | HR 70 | Ht <= 58 in | Wt 149.0 lb

## 2015-01-03 DIAGNOSIS — R002 Palpitations: Secondary | ICD-10-CM | POA: Diagnosis not present

## 2015-01-03 NOTE — Progress Notes (Signed)
01/03/2015 Andrew Oconnor   1956-05-12  [id number]  Primary Physician Haywood Pao, MD Primary Cardiologist: Dr. Martinique  Reason for Vist/CC: F/U for Chronic Diastolic CHF/ Palpiations  HPI:  The patient is a 59 y/o male, followed by Dr. Martinique. 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 He also has a h/o HTN, HL, OSA and hypothyroidism. He recently underwent cardiac cath at [facility] in the setting of dyspnea by Dr. Martinique on 11/24/14. This demonstrated normal anatomy and normal LV function. EDP was elevated. Echo with EF 55-60%. LV diastolic function normal. D-dimer was normal. He was treated with IV lasix and discharged home.  He was seen by Cecilie Kicks, NP, on 12/05/14 for post hospital f/u. He complained of continued dyspnea on exertion and weight gain. He was instructed to increase his spironolactone/HCTZ to 50/50.   He reports back to clinic today for f/u. He reports that he has continued on the lower dose of spironolactone/HCTZ at 25/25. He did not fill the higher dose prescription due to a high copay. His current copay on the lower dose is only $8/month. The higher dose would cost $50/month. Despite this, he is doing well. He denies any resting dyspnea. He still has mild DOE with moderate/hight intensity activity but improved since his last OV. He denies any further weight gain and no LEE. His main complaint today is frequent tachy palpiations. Symptoms occur intermittently, almost daily. Episodes last ~1-2 minutes and resolve spontaneously. He denies syncope/ near syncope. No chest pain.   Current Outpatient Prescriptions  Medication Sig Dispense Refill  . amitriptyline (ELAVIL) 10 MG tablet Take 1 tablet (10 mg total) by mouth at bedtime as needed for sleep. 90 tablet 3  . aspirin EC 325 MG EC tablet Take 1 tablet (325 mg total) by mouth daily. 30 tablet 0  . benazepril (LOTENSIN) 10 MG tablet     . Blood Glucose Calibration (OT ULTRA/FASTTK CNTRL SOLN) SOLN     . citalopram (CELEXA) 40  MG tablet TAKE ONE HALF TABLET BY MOUTH EVERY DAY 45 tablet 3  . clonazePAM (KLONOPIN) 1 MG tablet Take 1.5 tablets (1.5 mg total) by mouth at bedtime. (Patient taking differently: Take 0.5 mg by mouth at bedtime as needed for anxiety. ) 45 tablet 5  . levothyroxine (SYNTHROID, LEVOTHROID) 200 MCG tablet     . Melatonin 5 MG TBDP Take 5 mg by mouth Nightly. 30 tablet 11  . NOVOLOG 100 UNIT/ML injection Inject 0-80 Units into the skin continuous. Insulin pump    . omeprazole (PRILOSEC) 20 MG capsule Take 40 mg by mouth daily.     . ONE TOUCH ULTRA TEST test strip     . pravastatin (PRAVACHOL) 40 MG tablet     . spironolactone-hydrochlorothiazide (ALDACTAZIDE) 50-50 MG per tablet Take 1 tablet by mouth daily. 30 tablet 6  . metoprolol succinate (TOPROL-XL) 25 MG 24 hr tablet Take 1 tablet (25 mg total) by mouth daily. (Patient not taking: Reported on 01/03/2015) 30 tablet 0   No current facility-administered medications for this visit.    No Known Allergies  History   Social History  . Marital Status: Married    Spouse Name: [name]  . Number of Children: 0  . Years of Education: 12   Occupational History  . retired     Water quality scientist   Social History Main Topics  . Smoking status: Former Smoker    Quit date: 09/09/2002  . Smokeless tobacco: Never Used  Comment: quit smoking 2004  . Alcohol Use: Yes     Comment: occas.  . Drug Use: No  . Sexual Activity: Not on file   Other Topics Concern  . Not on file   Social History Narrative    may continue treatment of complex apnea with his current CPAP setting , good residual AHI of 3.3/   Has improved by [facility] and has only once a night  nocturia , less shortness of breath.   gel mask fits well acc. to air leak data but has left a pressure mark.    He may need an alternative mask until it's healed. I recommended a swift FX medium pillows .   DME [employer] - machine , HCS with Nicole Kindred,  RT.      Patient is married [name])  and lives at home with his wife and step-son.   Patient is retired.   Patient has a high school.   Patient is right-handed.   Patient drinks 1-2 cups of coffee every morning.              Review of Systems: General: negative for chills, fever, night sweats or weight changes.  Cardiovascular: negative for chest pain, dyspnea on exertion, edema, orthopnea, palpitations, paroxysmal nocturnal dyspnea or shortness of breath Dermatological: negative for rash Respiratory: negative for cough or wheezing Urologic: negative for hematuria Abdominal: negative for nausea, vomiting, diarrhea, bright red blood per rectum, melena, or hematemesis Neurologic: negative for visual changes, syncope, or dizziness All other systems reviewed and are otherwise negative except as noted above.    Blood pressure 124/62, pulse 70, height 4\' 7"  (1.397 m), weight 149 lb (67.586 kg).  General appearance: alert, cooperative and no distress Neck: no carotid bruit and no JVD Lungs: clear to auscultation bilaterally Heart: regular rate and rhythm, S1, S2 normal, no murmur, click, rub or gallop Extremities: no LEE Pulses: 2+ and symmetric Skin: warm and dry Neurologic: Grossly normal  EKG not performed  ASSESSMENT AND PLAN:    1. Chronic Diastolic CHF: patient appears to be evoulemic on physical exam. No further weight gain. Denies resting dyspnea, orthopnea/ PND or LEE. No dyspnea with routine activities. Continue current diuretic dose. We discussed importance of low sodium diet. If he has recurrent issues, he may need a more potent diuretic. If so, may need to consider switching to lasix (on 4 dollar list). Continue BB and ARB for BP control.  2. Palpitations: He is currently asymptomatic. RRR on physical exam. Will order 2 week heart monitor to assess for arrhthymias. For now, continue BB therapy with metoprolol.   PLAN  Will arrange f/u after 2 week monitoring periord if any arrthymias are recorded.  Otherwise f/u with Dr. Martinique in 3 months.   Arvilla Market 01/03/2015 9:37 AM

## 2015-01-03 NOTE — Patient Instructions (Signed)
Your physician recommends that you schedule a follow-up appointment in: 3 months with Dr. Martinique unless something come up with the Cardiac monitor that you will wear for two weeks

## 2015-01-10 DIAGNOSIS — E1040 Type 1 diabetes mellitus with diabetic neuropathy, unspecified: Secondary | ICD-10-CM | POA: Diagnosis not present

## 2015-01-17 ENCOUNTER — Ambulatory Visit: Payer: Medicare PPO | Admitting: Neurology

## 2015-01-17 ENCOUNTER — Other Ambulatory Visit: Payer: Self-pay

## 2015-01-17 DIAGNOSIS — R002 Palpitations: Secondary | ICD-10-CM

## 2015-01-18 ENCOUNTER — Ambulatory Visit (INDEPENDENT_AMBULATORY_CARE_PROVIDER_SITE_OTHER): Payer: Medicare PPO

## 2015-01-18 DIAGNOSIS — R002 Palpitations: Secondary | ICD-10-CM

## 2015-02-21 ENCOUNTER — Ambulatory Visit: Payer: Medicare PPO | Admitting: Neurology

## 2015-02-21 ENCOUNTER — Telehealth: Payer: Self-pay

## 2015-02-21 NOTE — Telephone Encounter (Signed)
Pt did not show for his appt today with Dr. Brett Fairy.

## 2015-02-28 ENCOUNTER — Encounter: Payer: Self-pay | Admitting: Neurology

## 2015-03-21 ENCOUNTER — Ambulatory Visit (INDEPENDENT_AMBULATORY_CARE_PROVIDER_SITE_OTHER): Payer: Medicare PPO | Admitting: Cardiology

## 2015-03-21 ENCOUNTER — Encounter: Payer: Self-pay | Admitting: Cardiology

## 2015-03-21 VITALS — BP 130/70 | HR 82 | Ht <= 58 in | Wt 153.0 lb

## 2015-03-21 DIAGNOSIS — I10 Essential (primary) hypertension: Secondary | ICD-10-CM | POA: Diagnosis not present

## 2015-03-21 DIAGNOSIS — E785 Hyperlipidemia, unspecified: Secondary | ICD-10-CM | POA: Diagnosis not present

## 2015-03-21 DIAGNOSIS — I5032 Chronic diastolic (congestive) heart failure: Secondary | ICD-10-CM

## 2015-03-21 MED ORDER — SPIRONOLACTONE-HCTZ 25-25 MG PO TABS: 1.0000 | ORAL_TABLET | Freq: Every day | ORAL | Status: DC

## 2015-03-21 NOTE — Patient Instructions (Signed)
Continue your current therapy.  Watch your salt intake and try and exercise regularly  We will follow up in 6 months.

## 2015-03-21 NOTE — Progress Notes (Signed)
03/21/2015 Andrew Oconnor   09/04/1956  [id number]  Primary Physician Haywood Pao, MD Primary Cardiologist: Dr. Martinique  Reason for Vist/CC: F/U for Chronic Diastolic CHF  HPI:  Andrew Oconnor is seen for follow up diastolic CHF.  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 He also has a h/o HTN, HL, OSA and hypothyroidism. He is s/p cardiac cath  on 11/24/14. This demonstrated normal anatomy and normal LV function. EDP was elevated. Echo with EF 55-60%. LV diastolic function normal.   On follow up today he reports he is doing very well. No SOB except when he missed his medication for 2 days. No chest pain. Mild swelling in his hands. Exercising more than before. Watching salt intake. No new complaints today.   Current Outpatient Prescriptions  Medication Sig Dispense Refill  . amitriptyline (ELAVIL) 10 MG tablet Take 1 tablet (10 mg total) by mouth at bedtime as needed for sleep. 90 tablet 3  . aspirin EC 325 MG EC tablet Take 1 tablet (325 mg total) by mouth daily. 30 tablet 0  . benazepril (LOTENSIN) 10 MG tablet     . Blood Glucose Calibration (OT ULTRA/FASTTK CNTRL SOLN) SOLN     . citalopram (CELEXA) 40 MG tablet TAKE ONE HALF TABLET BY MOUTH EVERY DAY 45 tablet 3  . levothyroxine (SYNTHROID, LEVOTHROID) 175 MCG tablet Take 175 mcg by mouth daily before breakfast.    . Melatonin 5 MG TBDP Take 5 mg by mouth Nightly. 30 tablet 11  . metoprolol succinate (TOPROL-XL) 25 MG 24 hr tablet Take 1 tablet (25 mg total) by mouth daily. 30 tablet 0  . NOVOLOG 100 UNIT/ML injection Inject 0-80 Units into the skin continuous. Insulin pump    . omeprazole (PRILOSEC) 20 MG capsule Take 40 mg by mouth daily.     . ONE TOUCH ULTRA TEST test strip     . pravastatin (PRAVACHOL) 40 MG tablet     . spironolactone-hydrochlorothiazide (ALDACTAZIDE) 25-25 MG per tablet Take 1 tablet by mouth daily.     No current facility-administered medications for this visit.    No Known Allergies  History    Social History  . Marital Status: Married    Spouse Name: Andrew Oconnor  . Number of Children: 0  . Years of Education: 12   Occupational History  . retired     Water quality scientist   Social History Main Topics  . Smoking status: Former Smoker    Quit date: 09/09/2002  . Smokeless tobacco: Never Used     Comment: quit smoking 2004  . Alcohol Use: Yes     Comment: occas.  . Drug Use: No  . Sexual Activity: Not on file   Other Topics Concern  . Not on file   Social History Narrative    may continue treatment of complex apnea with his current CPAP setting , good residual AHI of 3.3/   Has improved by Epworth and has only once a night  nocturia , less shortness of breath.   gel mask fits well acc. to air leak data but has left a pressure mark.    He may need an alternative mask until it's healed. I recommended a swift FX medium pillows .   DME [employer] - machine , HCS with Andrew Oconnor,  RT.      Patient is married Andrew Oconnor) and lives at home with his wife and step-son.   Patient is retired.   Patient has a high school.  Patient is right-handed.   Patient drinks 1-2 cups of coffee every morning.              Review of Systems: As noted in HPI.  All other systems reviewed and are otherwise negative except as noted above.    Blood pressure 130/70, pulse 82, height 4\' 7"  (1.397 m), weight 69.4 kg (153 lb).  General appearance: alert, cooperative and no distress Neck: no carotid bruit and no JVD Lungs: clear to auscultation bilaterally Heart: regular rate and rhythm, S1, S2 normal, no murmur, click, rub or gallop Extremities: no LEE Pulses: 2+ and symmetric Skin: warm and dry Neurologic: Grossly normal  EKG not performed  ASSESSMENT AND PLAN:    1. Chronic Diastolic CHF: patient appears to be evoulemic on physical exam.  Continue current diuretic dose. We discussed importance of low sodium diet.  Continue BB and ARB for BP control.  2. Palpitations: He is currently  asymptomatic. Prior event monitor showed one 7 beat run NSVT.  For now, continue BB therapy with metoprolol.   Follow up in 6 months.

## 2015-03-23 DIAGNOSIS — E1040 Type 1 diabetes mellitus with diabetic neuropathy, unspecified: Secondary | ICD-10-CM | POA: Diagnosis not present

## 2015-03-23 DIAGNOSIS — I10 Essential (primary) hypertension: Secondary | ICD-10-CM | POA: Diagnosis not present

## 2015-03-23 DIAGNOSIS — Z4681 Encounter for fitting and adjustment of insulin pump: Secondary | ICD-10-CM | POA: Diagnosis not present

## 2015-03-23 DIAGNOSIS — Z6831 Body mass index (BMI) 31.0-31.9, adult: Secondary | ICD-10-CM | POA: Diagnosis not present

## 2015-03-29 DIAGNOSIS — G4733 Obstructive sleep apnea (adult) (pediatric): Secondary | ICD-10-CM | POA: Diagnosis not present

## 2015-04-03 DIAGNOSIS — E1040 Type 1 diabetes mellitus with diabetic neuropathy, unspecified: Secondary | ICD-10-CM | POA: Diagnosis not present

## 2015-04-22 DIAGNOSIS — Z87891 Personal history of nicotine dependence: Secondary | ICD-10-CM | POA: Diagnosis not present

## 2015-04-22 DIAGNOSIS — W260XXA Contact with knife, initial encounter: Secondary | ICD-10-CM | POA: Diagnosis not present

## 2015-04-22 DIAGNOSIS — Z7982 Long term (current) use of aspirin: Secondary | ICD-10-CM | POA: Diagnosis not present

## 2015-04-22 DIAGNOSIS — S61217A Laceration without foreign body of left little finger without damage to nail, initial encounter: Secondary | ICD-10-CM | POA: Diagnosis not present

## 2015-04-22 DIAGNOSIS — S61210A Laceration without foreign body of right index finger without damage to nail, initial encounter: Secondary | ICD-10-CM | POA: Diagnosis not present

## 2015-04-22 DIAGNOSIS — Z23 Encounter for immunization: Secondary | ICD-10-CM | POA: Diagnosis not present

## 2015-04-22 DIAGNOSIS — E119 Type 2 diabetes mellitus without complications: Secondary | ICD-10-CM | POA: Diagnosis not present

## 2015-04-25 DIAGNOSIS — E785 Hyperlipidemia, unspecified: Secondary | ICD-10-CM | POA: Diagnosis not present

## 2015-04-25 DIAGNOSIS — Z125 Encounter for screening for malignant neoplasm of prostate: Secondary | ICD-10-CM | POA: Diagnosis not present

## 2015-04-25 DIAGNOSIS — E1040 Type 1 diabetes mellitus with diabetic neuropathy, unspecified: Secondary | ICD-10-CM | POA: Diagnosis not present

## 2015-04-29 IMAGING — US US ABDOMEN COMPLETE
1 series · 13 of 25 positions shown · non-contrast
Comparison: None.

CLINICAL DATA: Elevated liver function tests, diabetes, on
medication for hypertension

COMPLETE ABDOMINAL ULTRASOUND

[Series 1: us abdomen complete · 0.41mm/px · 13 of 69 slices shown]
[im 1/69]
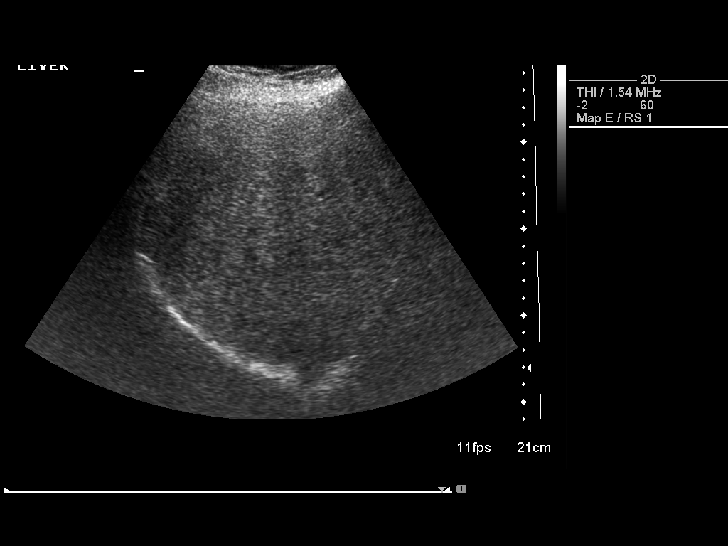
[im 6/69]
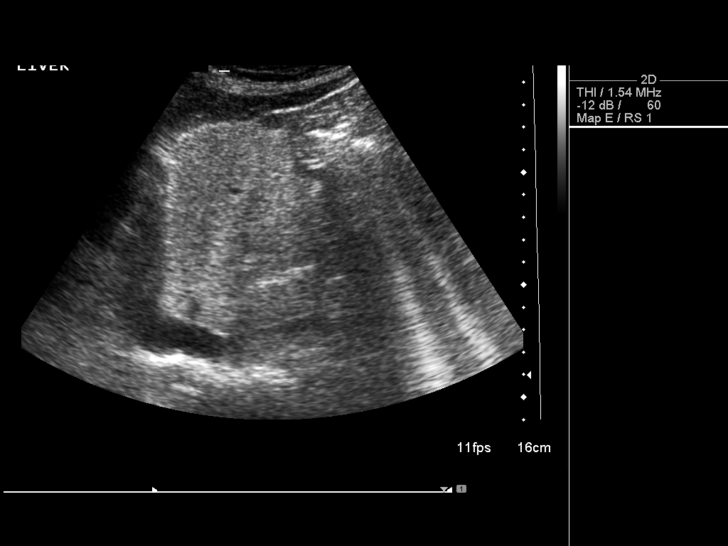
[im 12/69]
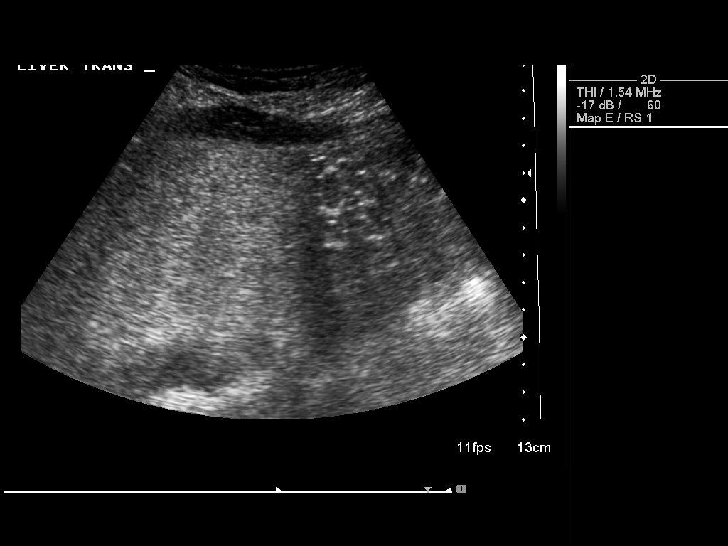
[im 18/69]
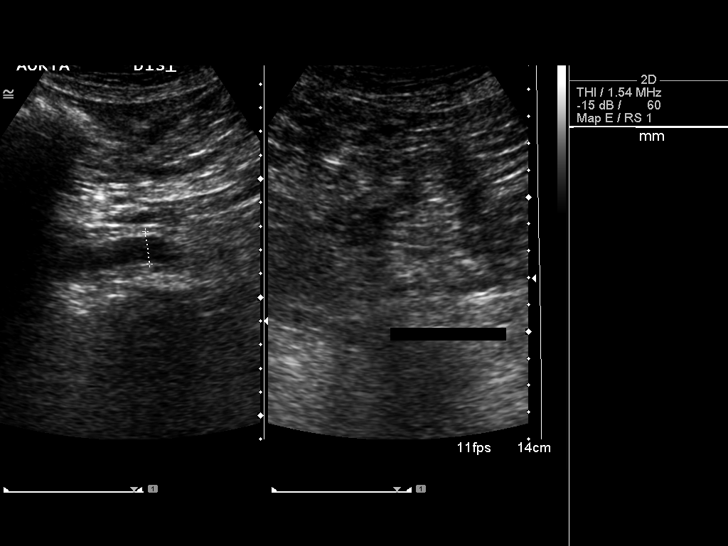
[im 23/69]
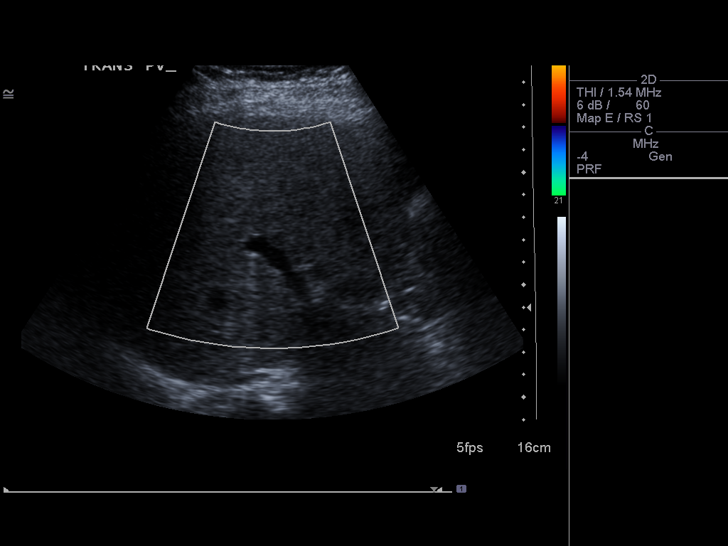
[im 29/69]
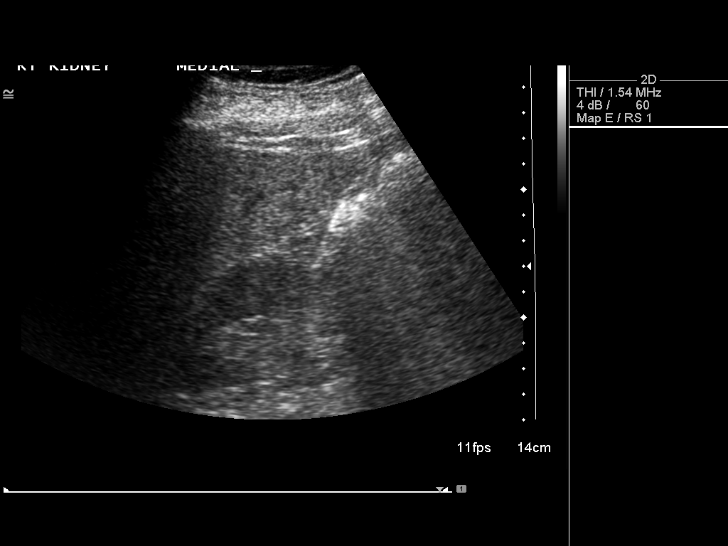
[im 35/69]
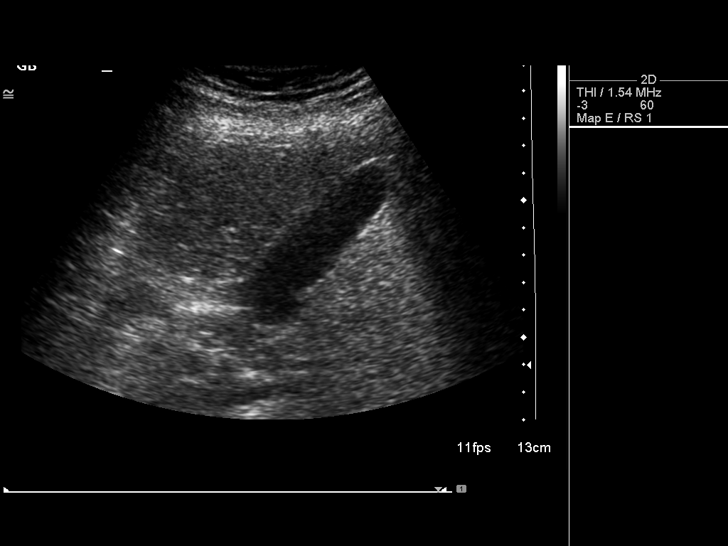
[im 40/69]
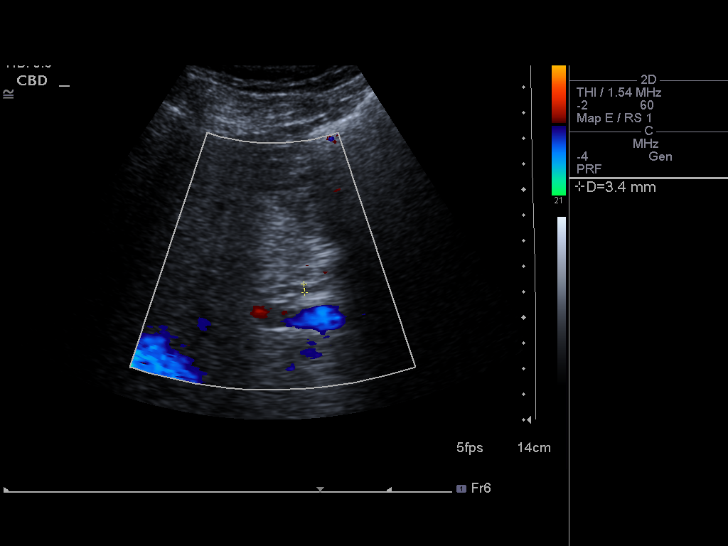
[im 46/69]
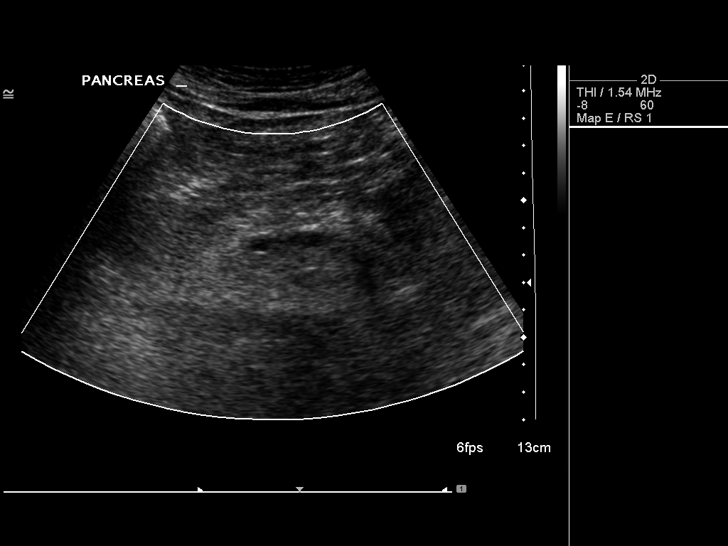
[im 52/69]
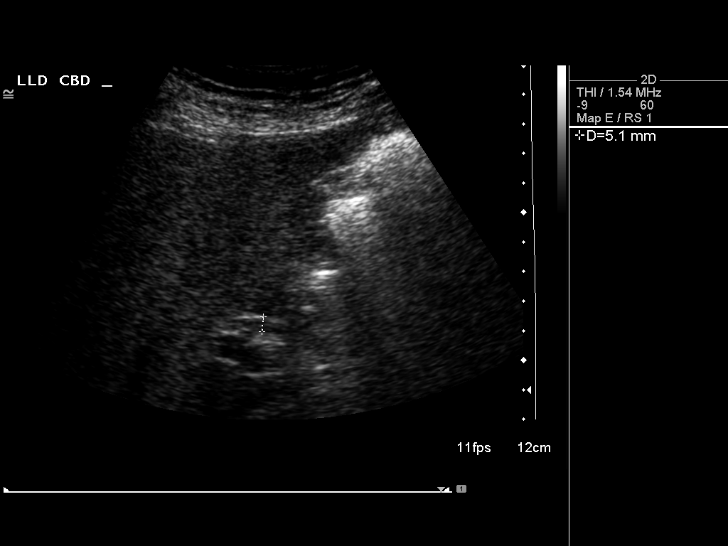
[im 57/69]
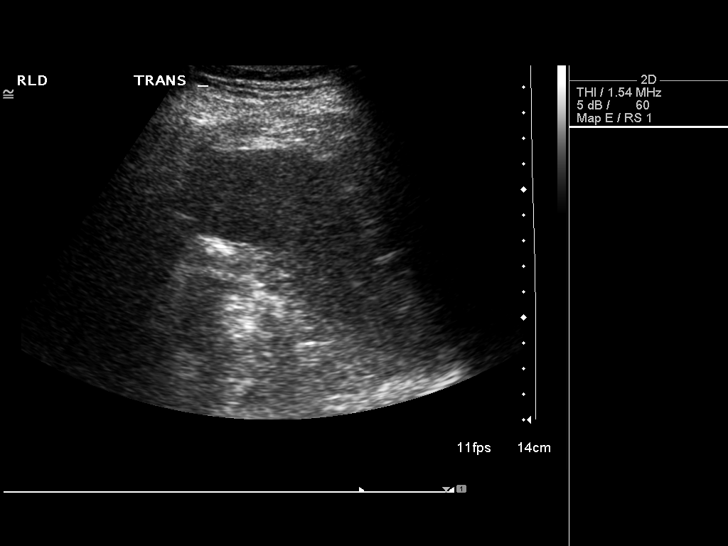
[im 63/69]
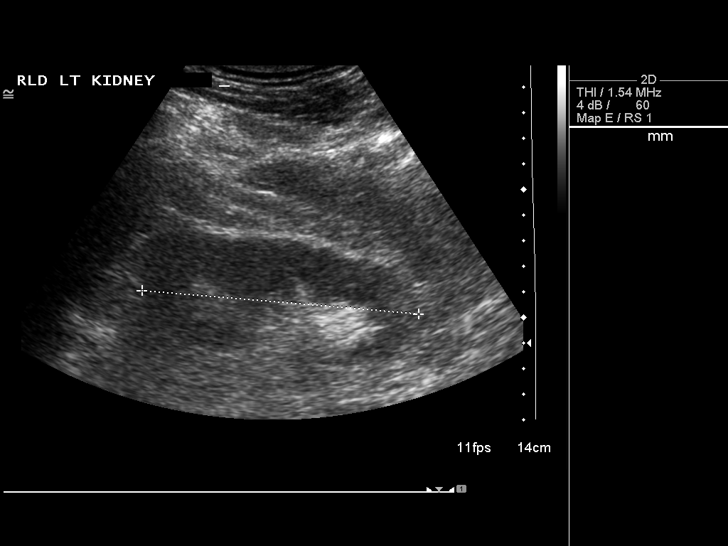
[im 69/69]
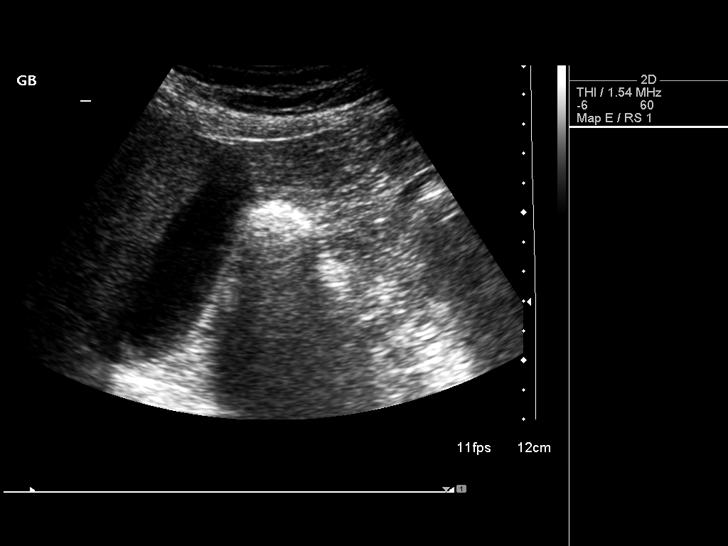

[13 of 25 positions shown; findings below may reference images not displayed]

FINDINGS: Gallbladder:  The gallbladder is visualized and no gallstones are
noted.  There is no pain over the gallbladder with compression.

Common bile duct:  The common bile duct is normal measuring up to
5.1 mm in diameter.

Liver:  The liver is diffusely echogenic consistent with fatty
infiltration.  A tiny cyst is noted anteriorly in the left lobe of
1.4 cm in diameter.

IVC:  Appears normal.

Pancreas:  The mid body of the pancreas appears normal, with much
of the head and tail of the pancreas obscured by bowel gas.

Spleen:  The spleen is normal measuring 7.1 cm sagittally.

Right Kidney:  No hydronephrosis is seen.  The right kidney
measures 10.3 cm sagittally.

Left Kidney:  No hydronephrosis is noted.  The left kidney measures
11.0 cm.

Abdominal aorta:  The abdominal aorta is normal in caliber although
partially obscured by bowel gas.

Some of the anatomy is obscured due to a large amount of bowel gas
in this patient.
IMPRESSION: 1.  Very echogenic liver consistent with diffuse fatty
infiltration.  Small cyst in the left lobe of liver anteriorly of
1.4 cm.
2.  No gallstones.
3.  Much of the anatomy is obscured by bowel gas.

## 2015-05-01 DIAGNOSIS — I209 Angina pectoris, unspecified: Secondary | ICD-10-CM | POA: Diagnosis not present

## 2015-05-01 DIAGNOSIS — G4752 REM sleep behavior disorder: Secondary | ICD-10-CM | POA: Diagnosis not present

## 2015-05-01 DIAGNOSIS — Z0000 Encounter for general adult medical examination without abnormal findings: Secondary | ICD-10-CM | POA: Diagnosis not present

## 2015-05-01 DIAGNOSIS — Z794 Long term (current) use of insulin: Secondary | ICD-10-CM | POA: Diagnosis not present

## 2015-05-01 DIAGNOSIS — E11319 Type 2 diabetes mellitus with unspecified diabetic retinopathy without macular edema: Secondary | ICD-10-CM | POA: Diagnosis not present

## 2015-05-01 DIAGNOSIS — I5032 Chronic diastolic (congestive) heart failure: Secondary | ICD-10-CM | POA: Diagnosis not present

## 2015-05-01 DIAGNOSIS — K760 Fatty (change of) liver, not elsewhere classified: Secondary | ICD-10-CM | POA: Diagnosis not present

## 2015-05-01 DIAGNOSIS — F321 Major depressive disorder, single episode, moderate: Secondary | ICD-10-CM | POA: Diagnosis not present

## 2015-05-01 DIAGNOSIS — I10 Essential (primary) hypertension: Secondary | ICD-10-CM | POA: Diagnosis not present

## 2015-05-08 DIAGNOSIS — Z1212 Encounter for screening for malignant neoplasm of rectum: Secondary | ICD-10-CM | POA: Diagnosis not present

## 2015-05-16 DIAGNOSIS — M65342 Trigger finger, left ring finger: Secondary | ICD-10-CM | POA: Diagnosis not present

## 2015-05-16 DIAGNOSIS — M65341 Trigger finger, right ring finger: Secondary | ICD-10-CM | POA: Diagnosis not present

## 2015-06-21 DIAGNOSIS — M7712 Lateral epicondylitis, left elbow: Secondary | ICD-10-CM | POA: Diagnosis not present

## 2015-06-21 DIAGNOSIS — M65342 Trigger finger, left ring finger: Secondary | ICD-10-CM | POA: Diagnosis not present

## 2015-06-21 DIAGNOSIS — M65341 Trigger finger, right ring finger: Secondary | ICD-10-CM | POA: Diagnosis not present

## 2015-06-21 DIAGNOSIS — E119 Type 2 diabetes mellitus without complications: Secondary | ICD-10-CM | POA: Diagnosis not present

## 2015-06-27 DIAGNOSIS — G4733 Obstructive sleep apnea (adult) (pediatric): Secondary | ICD-10-CM | POA: Diagnosis not present

## 2015-06-29 DIAGNOSIS — E1040 Type 1 diabetes mellitus with diabetic neuropathy, unspecified: Secondary | ICD-10-CM | POA: Diagnosis not present

## 2015-07-12 DIAGNOSIS — E1040 Type 1 diabetes mellitus with diabetic neuropathy, unspecified: Secondary | ICD-10-CM | POA: Diagnosis not present

## 2015-07-12 DIAGNOSIS — I10 Essential (primary) hypertension: Secondary | ICD-10-CM | POA: Diagnosis not present

## 2015-07-12 DIAGNOSIS — Z4681 Encounter for fitting and adjustment of insulin pump: Secondary | ICD-10-CM | POA: Diagnosis not present

## 2015-07-12 DIAGNOSIS — Z23 Encounter for immunization: Secondary | ICD-10-CM | POA: Diagnosis not present

## 2015-08-01 DIAGNOSIS — E668 Other obesity: Secondary | ICD-10-CM | POA: Diagnosis not present

## 2015-08-01 DIAGNOSIS — E11319 Type 2 diabetes mellitus with unspecified diabetic retinopathy without macular edema: Secondary | ICD-10-CM | POA: Diagnosis not present

## 2015-08-01 DIAGNOSIS — G4733 Obstructive sleep apnea (adult) (pediatric): Secondary | ICD-10-CM | POA: Diagnosis not present

## 2015-08-01 DIAGNOSIS — G2581 Restless legs syndrome: Secondary | ICD-10-CM | POA: Diagnosis not present

## 2015-08-01 DIAGNOSIS — E038 Other specified hypothyroidism: Secondary | ICD-10-CM | POA: Diagnosis not present

## 2015-08-01 DIAGNOSIS — E7800 Pure hypercholesterolemia, unspecified: Secondary | ICD-10-CM | POA: Diagnosis not present

## 2015-08-01 DIAGNOSIS — Z794 Long term (current) use of insulin: Secondary | ICD-10-CM | POA: Diagnosis not present

## 2015-08-01 DIAGNOSIS — E1040 Type 1 diabetes mellitus with diabetic neuropathy, unspecified: Secondary | ICD-10-CM | POA: Diagnosis not present

## 2015-08-01 DIAGNOSIS — G629 Polyneuropathy, unspecified: Secondary | ICD-10-CM | POA: Diagnosis not present

## 2015-08-09 ENCOUNTER — Encounter: Payer: Self-pay | Admitting: Neurology

## 2015-08-09 ENCOUNTER — Ambulatory Visit (INDEPENDENT_AMBULATORY_CARE_PROVIDER_SITE_OTHER): Payer: Medicare PPO | Admitting: Neurology

## 2015-08-09 ENCOUNTER — Telehealth: Payer: Self-pay | Admitting: Neurology

## 2015-08-09 VITALS — BP 124/60 | HR 86 | Resp 20 | Ht <= 58 in | Wt 159.0 lb

## 2015-08-09 DIAGNOSIS — G253 Myoclonus: Secondary | ICD-10-CM

## 2015-08-09 DIAGNOSIS — G4752 REM sleep behavior disorder: Secondary | ICD-10-CM

## 2015-08-09 DIAGNOSIS — G3184 Mild cognitive impairment, so stated: Secondary | ICD-10-CM | POA: Diagnosis not present

## 2015-08-09 DIAGNOSIS — G2581 Restless legs syndrome: Secondary | ICD-10-CM | POA: Diagnosis not present

## 2015-08-09 DIAGNOSIS — G4733 Obstructive sleep apnea (adult) (pediatric): Secondary | ICD-10-CM | POA: Diagnosis not present

## 2015-08-09 DIAGNOSIS — Z9989 Dependence on other enabling machines and devices: Secondary | ICD-10-CM

## 2015-08-09 MED ORDER — DONEPEZIL HCL 5 MG PO TABS: 5.0000 mg | ORAL_TABLET | Freq: Every day | ORAL | Status: DC

## 2015-08-09 MED ORDER — ROPINIROLE HCL 0.25 MG PO TABS: 0.2500 mg | ORAL_TABLET | Freq: Every day | ORAL | Status: DC

## 2015-08-09 MED ORDER — CLONAZEPAM 1 MG PO TABS: 1.0000 mg | ORAL_TABLET | Freq: Two times a day (BID) | ORAL | Status: DC | PRN

## 2015-08-09 NOTE — Progress Notes (Signed)
[facility] SLEEP CLINIC NOTE  Provider:  Dr Brett Fairy Referring Provider: Tisovec Primary Care Physician:  Haywood Pao, MD  Chief Complaint  Patient presents with  . Follow-up    RLS, cpap, memory, MMSE 21/30, rm 11, alone    HPI:  Andrew Oconnor is a 59 y.o. male here as a referral from Dr. Osborne Casco, for evaluation of sleep behaviors.    08-09-15   1) parasomnia [name] reports that he still acts out dreams in spite of being treated with Klonopin. Sometimes his thrashes,  he may kick  or even punch . the Klonopin has not affected him negatively.He has not fallen, stumbled or had enuresis.  We may add Melatonin at night . Without Klonopin he has violent jerking movements and even injured himself . He was jerky and twitching in daytime, too. This was a rebound effect while he ran out of medication.  I will maintain him on Klonipin. He ran out the last 3 days. The sleep behaviors were significantly worse as the patient acknowledges and his wife has noticed as well.    2)Sleep apnea is sufficiently treated. A download obtained in office today 06-01-14 document of a set pressure of 13 cm water, EPI 3 cm water and an AHI 3.6. The patient has a 90% compliance for 27 out of 30 days 77% compliance for over 4 hours of nightly use. The average user time is 6 hours and 28 minutes. His apnea is sufficiently treated considering the residual apnea index is under 5 per hour. He does have some significant air leaks some nights but for the majority of the nights his air leak is mild.  He was not able to bring the CPAP machine on a short trip between the 17th and 19th of November.  3) RLS? The also gave the patient a restless leg question here he states that he has a moderate to severe impairment of his quality of sleep for the last 7 nights. A second questionnaire, the RLS quality of life questionnaire was also endorsed at a moderate to severe impairment. This questionnaires will  be scanned into epic. The patient reports having restless legs every night and sometimes even during the day when forced to sit still or when trying to rest in a comfortable reclining position. At times it feels like his whole body has the irresistible urge to move. He feels that his restlessness arises from the knee not from the foot. He has no history of lower back problems or surgeries,  no trauma.   4) memory loss .The patient had mentioned memory problems in his last visit we performed today a Mini-Mental Status Examination. He reports trouble with concentration and focus as well as occasionally maintaining his train of thoughts. He will pull a blank in the middle of a conversation, other times he will does not remember certain word or name. Sometimes it comes to him was a delay but sometimes not at all. He did not feel depressed. He is a HS graduate .   MMSE : he scored 21 out of 30 possible points. He did not well with the calculation nor was the word backward spelling. He was able to perform multistep commands but not to repeat a sentence and he had some trouble was copying a design of pentagons he actually used squares. He wrote a sentence without any tremor impairment and drew a round clock face perfectly. 4/4 .   AFT 13 .   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 His baseline AHI at the time was 20 and he was titrated to 12 cm water. In November 2010 his AHI was checked the last time on his CPAP machine at  3.3, a good result.  The patient is considered compliant. Today [name] returns after 4 years,  stating that he has developed a sleep behavior that is concerning to him. He seems to act out dreams, kicking and punching,  is not aware of his behaviors, which occur in his sleep. He may thrash about or even hit his wife accidentally and then seems to go back to sleep. His wife has noticed  that his behavior occurs mostly after 3 AM and probably between 3 and 5 AM, which would correlate with REM sleep. The patient is continuously using his CPAP which he brought here today but he states that he feels longer as refreshed as initially and his therapy was started. [name] goes to bed around 11:00 and with help of his medication namely Klonopin and falls asleep quickly in less than 15 minutes. He may have one bathroom break at night but that certainly does not endorse nocturia. He wakes normally upper one 6 AM before his alarm is that his wife leaving for work. He sometimes awakens wears swollen ankles and puffy hands. He has developed a slightly nasal voice with dysphonia. Here shortly that this is not seasonal or allergy related. Has no nasal breathing restrictions.   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  The patient is already taking Klonopin 3 mg at night and is on Celexa 40 mg daily, these medications can affect his sleep architecture greatly. Klonopin would be the drug of choice for REM behavior disorder. He endorsed the Epworth sleepiness score at 11 points his original sleepiness score was 14 points on 04-26-2009 - At the time, Mr. Parkhouse was followed by  Dr. Claudie Fisherman.  Review of Systems: Out of a complete 14 system review, the patient complains of only the following symptoms, and all other reviewed systems are negative. epworth 11 points , was 14 in 2010.   Social History   Social History  . Marital Status: Married    Spouse Name: Andrew Oconnor  . Number of Children: 0  . Years of Education: 12   Occupational History  . retired     Water quality scientist   Social History Main Topics  . Smoking status: Former Smoker    Quit date: 09/09/2002  . Smokeless tobacco: Never Used     Comment: quit smoking 2004  . Alcohol Use: Yes     Comment: occas.  .  Drug Use: No  . Sexual Activity: Not on file   Other Topics Concern  . Not on file   Social History Narrative    may continue treatment of complex apnea with his current CPAP setting , good residual AHI of 3.3/   Has improved by Epworth and has only once a night  nocturia , less shortness of breath.   gel mask fits well acc. to air leak data but has left a pressure mark.    He may need an alternative mask until it's healed. I recommended a swift FX medium pillows .   DME [employer] - machine , HCS with Nicole Kindred,  RT.  Patient is married Andrew Oconnor) and lives at home with his wife and step-son.   Patient is retired.   Patient has a high school.   Patient is right-handed.   Patient drinks 1-2 cups of coffee every morning.             Family History  Problem Relation Age of Onset  . Heart disease Mother   . Heart disease Father   . Diabetes Father   . Stroke Father   . Seizures Father   . Cancer Sister   . Heart disease Sister   . Heart disease Brother   . Diabetes Brother   . Heart disease Maternal Grandmother   . Heart disease Maternal Grandfather   . Heart disease Paternal Grandmother   . Heart disease Paternal Grandfather   . Stroke Other     Past Medical History  Diagnosis Date  . Thyroid disorder   . Depression   . Anxiety   . Diabetes mellitus without complication ([place])   . Hyperlipidemia   . Sleep apnea with use of continuous positive airway pressure (CPAP)     diagnsed AHi of 20 in 2010 , titrated to 13 cm H20.  Marland Kitchen History of diplopia      third nerve palsy 2013   . Hypertension   . GERD (gastroesophageal reflux disease)   . Hiatal hernia   . Carpal tunnel syndrome   . Obesity   . Colon polyp   . Erectile dysfunction     Past Surgical History  Procedure Laterality Date  . Hemorrectomy    . Left heart catheterization with coronary angiogram N/A 11/24/2014    Procedure: LEFT HEART CATHETERIZATION WITH CORONARY ANGIOGRAM;  Surgeon: Peter M Martinique, MD;   Location: [facility] CATH LAB;  Service: Cardiovascular;  Laterality: N/A;    Current Outpatient Prescriptions  Medication Sig Dispense Refill  . amitriptyline (ELAVIL) 10 MG tablet Take 1 tablet (10 mg total) by mouth at bedtime as needed for sleep. 90 tablet 3  . aspirin EC 325 MG EC tablet Take 1 tablet (325 mg total) by mouth daily. 30 tablet 0  . benazepril (LOTENSIN) 10 MG tablet     . Blood Glucose Calibration (OT ULTRA/FASTTK CNTRL SOLN) SOLN     . citalopram (CELEXA) 20 MG tablet     . levothyroxine (SYNTHROID, LEVOTHROID) 150 MCG tablet     . metoprolol succinate (TOPROL-XL) 25 MG 24 hr tablet Take 1 tablet (25 mg total) by mouth daily. 30 tablet 0  . NOVOLOG 100 UNIT/ML injection Inject 0-80 Units into the skin continuous. Insulin pump    . omeprazole (PRILOSEC) 20 MG capsule Take 40 mg by mouth daily.     . ONE TOUCH ULTRA TEST test strip     . pravastatin (PRAVACHOL) 40 MG tablet     . spironolactone-hydrochlorothiazide (ALDACTAZIDE) 25-25 MG per tablet Take 1 tablet by mouth daily.     No current facility-administered medications for this visit.    Allergies as of 08/09/2015  . (No Known Allergies)    Vitals: BP 124/60 mmHg  Pulse 86  Resp 20  Ht 4\' 7"  (1.397 m)  Wt 159 lb (72.122 kg)  BMI 36.96 kg/m2 Last Weight:  Wt Readings from Last 1 Encounters:  08/09/15 159 lb (72.122 kg)   Last Height:   Ht Readings from Last 1 Encounters:  08/09/15 4\' 7"  (1.397 m)   Vision Screening:  Left eye with correction .  Right eye with correction as see vitals .  FSS 36, Epworth 11 points.   Physical exam:  General: The patient is awake, alert and appears not in acute distress. The patient is well groomed. 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 Mallampati grade 3 - uvula is not visible, tongue is large for jaw.  He can breath through either nasion, no septal deviation ,  neck circumference: large 18 inches  Cardiovascular:  Regular  rate and rhythm, without  murmurs or carotid bruit, and without distended neck veins. Respiratory: Lungs are clear to auscultation. Skin:  Without evidence of edema, or rash, facial erythema, palmar erythema/   Trunk: BMI is elevated and patient  has normal posture.   He of short built, walks erect without shuffling.   Neurologic exam : The patient is awake and alert, oriented to place and time. Memory subjective described as impaired( " short term is gone ") . MMSE documented objective deficits-  Calculation, fluency and recall. Clock draw 4/4 , 21-30- no longer mild cognitive impairment.    There is a limited l attention span & concentration ability. Speech is fluent without  dysarthria,or aphasia. Evident is a hoarse, raspy voice.  Mood and affect are appropriate.  Cranial nerves: Pupils are equal and briskly reactive to light.  Visual fields by finger perimetry are intact. Hearing to finger rub intact.  Facial sensation intact to fine touch. Facial motor strength is symmetric and tongue midline.  Motor exam:   General slightly elevated tone and normal muscle bulk , symmetric in all extremities. There is significant grip strength loss bilaterally.   Sensory:  Fine touch, pinprick and vibration were tested in all extremities.  There is numbness to the ankle  In both feet to fine touch, and described pin and needle dysesthesia.  Proprioception is tested in the upper extremities only. This was  normal.  Coordination: Rapid alternating movements in the fingers/hands is tested and is abnormally slow  Finger-to-nose maneuver tested and normal without evidence of ataxia, dysmetria or tremor. NO COGWHEELING.   Gait and station: Patient walks without assistive device and is able unassisted to climb up to the exam table . Strength within normal limits. Stance is stable and normal.  Tandem gait is impaired - turns with 4 Steps- his Romberg testing is negative . Deep tendon reflexes: in the   upper and lower extremities are symmetric, attenuated in patella and absent in achilles. and intact.  Assessment:  After physical and neurologic examination, review of laboratory studies, imaging, neurophysiology testing and pre-existing records, assessment 35 minutes during memory testing, restless leg evaluation, medication review, and compliance for CPAP report. More than 50% of the face to face time was dedicated to coordination of care in 4 Main areas of neurologic evaluation.  Plan:  Treatment plan and additional workup :   Assessment; #1 complex sleep apnea was more obstructive character.  the sleep apnea is optimally treated.  Assessment #2 REM behavior disorder, clinical description of onset time the briefness of spells, time of ocurrance and the patient's inability to recall these speaks for  REM behavior disorder - which is highly correlated to Lewy body disease and to Parkinson's disease. He is stable on Klonopin. He may add melatonin or an SSRI.  REM behavior disorder can proceed for his disorders by 7 years. The treatment of choice his Klonopin, of which the patient already takes 1 mg nightly in addition to amitriptyline.   He ran out of [facility]  For the last 3 nights while  at 3 mg daily dose and suffered myoclonic jerks in day and night time- weaned down to 1 mg.  He is allowed to take 1.5 mg Klonopin should the Melatonin not have a beneficial effect 30 days.  I would like to read to reduce Celexa from 40 mg to 20 mg in AM , keep Klonopin at night and added Melatonin .    Assessment #3 memory impairment. The patient reports a rather slow progression of memory impairment this seems to be twofold he feels easier distractible he is losing his train of thought easier and sometimes he has word finding and naming difficulties. I am not sure why his serial 7 test was as impaired as it was today. He was able to  remember 2 out of 3 recall words which is usually the hardest part of this  test. MMSE was 21 out of 30 people at either Namenda or Aricept. My feeling is that Aricept is a stronger more valid medication for memory preservation.  Assessment #4 myoclonic jerks that arise in the patient is at rest.  RLS -  He feels an irresistible urge to move cannot control the movement but they have been also suppressed somewhat by Klonopin. I would like to add a low-dose of Requip. I will also add a iron binding capacity and a ferritin level. Patient has no evidence of spinal cord injuries or lower back pain. I do not see a need for an imaging study.   RV in 6 month with NP,  Ward Givens for MOCA/MMSE and possible d/c amitriptyline , check on outcome of the newly started aricept . Started Requip.  Glennda Weatherholtz, MD    Cc  Domenick Gong, MD

## 2015-08-09 NOTE — Telephone Encounter (Signed)
Patient is calling to get medication rOPINIRole (REQUIP) 0.25 MG tablet called to [facility] in [place]. The patient did get written Rx's at his visit today but this one was not included. Thank you.

## 2015-08-09 NOTE — Addendum Note (Signed)
Addended by: Larey Seat on: 08/09/2015 10:40 AM   Modules accepted: Orders

## 2015-08-09 NOTE — Patient Instructions (Signed)
Ropinirole tablets What is this medicine? ROPINIROLE (roe PIN i role) is used to treat the symptoms of Parkinson's disease. It helps to improve muscle control and movement difficulties. It is also used for the treatment of Restless Legs Syndrome. This medicine may be used for other purposes; ask your health care provider or pharmacist if you have questions.  This medication has been prescribed to Mr. Andrew Oconnor for the treatment of restless legs.   What should I tell my health care provider before I take this medicine? They need to know if you have any of these conditions: -dizzy or fainting spells -heart disease -high blood pressure -kidney disease -liver disease -low blood pressure -sleeping problems -an unusual or allergic reaction to ropinirole, other medicines, foods, dyes, or preservatives -pregnant or trying to get pregnant -breast-feeding How should I use this medicine? Take this medicine by mouth with a glass of water. Follow the directions on the prescription label. You can take it with or without food. If it upsets your stomach, take it with food. Take your doses at regular intervals. Do not take your medicine more often than directed. Do not stop taking this medicine except on your doctor's advice. Talk to your pediatrician regarding the use of this medicine in children. Special care may be needed. Overdosage: If you think you have taken too much of this medicine contact a poison control center or emergency room at once. NOTE: This medicine is only for you. Do not share this medicine with others. What if I miss a dose? If you miss a dose, take it as soon as you can. If it is almost time for your next dose, take only that dose. Do not take double or extra doses. What may interact with this medicine? -ciprofloxacin -male hormones, like estrogens and birth control pills -medicines for depression, anxiety, or psychotic  disturbances -metoclopramide -mexiletine -norfloxacin -omeprazole This list may not describe all possible interactions. Give your health care provider a list of all the medicines, herbs, non-prescription drugs, or dietary supplements you use. Also tell them if you smoke, drink alcohol, or use illegal drugs. Some items may interact with your medicine. What should I watch for while using this medicine? Visit your doctor or health care professional for regular checks on your progress. It may be several weeks or months before you feel the full effect of this medicine. You may get drowsy or dizzy. Do not drive, use machinery, or do anything that needs mental alertness until you know how this drug affects you. Do not stand or sit up quickly, especially if you are an older patient. This reduces the risk of dizzy or fainting spells. Alcohol can increase possible dizziness. Avoid alcoholic drinks. If you find that you have sudden feelings of wanting to sleep during normal activities, like cooking, watching television, or while driving or riding in a car, you should contact your health care professional. Your mouth may get dry. Chewing sugarless gum or sucking hard candy, and drinking plenty of water may help. Contact your doctor if the problem does not go away or is severe. There have been reports of increased sexual urges or other strong urges such as gambling while taking some medicines for Parkinson's disease. If you experience any of these urges while taking this medicine, you should report it to your health care provider as soon as possible. You should check your skin often for changes to moles and new growths while taking this medicine. Call your doctor if you notice  any of these changes. What side effects may I notice from receiving this medicine? Side effects that you should report to your doctor or health care professional as soon as possible: -changes in vision -chest pain -confusion -falling  asleep during normal activities like driving -fast, irregular heartbeat -feeling faint or lightheaded, falls -hallucination, loss of contact with reality -increase or decrease in blood pressure -joint or muscle pain -loss of bladder control -numbness, tingling, or prickly sensations -shortness of breath, troubled breathing, tightness in chest, or wheezing -suicidal thoughts or other mood changes -uncontrollable head, mouth, neck, arm, or leg movements -vomiting Side effects that usually do not require medical attention (report to your doctor or health care professional if they continue or are bothersome): -clumsiness, feeling unsteady, or dizziness, especially early in treatment -flushing -headache -increased sweating -nausea -tremor -yawning This list may not describe all possible side effects. Call your doctor for medical advice about side effects. You may report side effects to FDA at 1-800-FDA-1088. Where should I keep my medicine? Keep out of the reach of children. Store at room temperature between 20 and 25 degrees C (68 and 77 degrees F). Protect from light and moisture. Keep container tightly closed. Throw away any unused medicine after the expiration date. NOTE: This sheet is a summary. It may not cover all possible information. If you have questions about this medicine, talk to your doctor, pharmacist, or health care provider.    2016, Elsevier/Gold Standard. (2008-12-13 20:56:15) Donepezil tablets What is this medicine?  This medication has been prescribed to Andrew Oconnor for a one-month trial. Treatment for mild cognitive impairment with amnestic features.  DONEPEZIL (doe NEP e zil) is used to treat mild to moderate dementia caused by Alzheimer's disease. This medicine may be used for other purposes; ask your health care provider or pharmacist if you have questions. What should I tell my health care provider before I take this medicine? They need to know if you  have any of these conditions: -asthma or other lung disease -difficulty passing urine -head injury -heart disease -history of irregular heartbeat -liver disease -seizures (convulsions) -stomach or intestinal disease, ulcers or stomach bleeding -an unusual or allergic reaction to donepezil, other medicines, foods, dyes, or preservatives -pregnant or trying to get pregnant -breast-feeding How should I use this medicine? Take this medicine by mouth with a glass of water. Follow the directions on the prescription label. You may take this medicine with or without food. Take this medicine at regular intervals. This medicine is usually taken before bedtime. Do not take it more often than directed. Continue to take your medicine even if you feel better. Do not stop taking except on your doctor's advice. If you are taking the 23 mg donepezil tablet, swallow it whole; do not cut, crush, or chew it. Talk to your pediatrician regarding the use of this medicine in children. Special care may be needed. Overdosage: If you think you have taken too much of this medicine contact a poison control center or emergency room at once. NOTE: This medicine is only for you. Do not share this medicine with others. What if I miss a dose? If you miss a dose, take it as soon as you can. If it is almost time for your next dose, take only that dose, do not take double or extra doses. What may interact with this medicine? Do not take this medicine with any of the following medications: -certain medicines for fungal infections like itraconazole, fluconazole, posaconazole, and voriconazole -  cisapride -dextromethorphan; quinidine -dofetilide -dronedarone -pimozide -quinidine -thioridazine -ziprasidone This medicine may also interact with the following medications: -antihistamines for allergy, cough and cold -atropine -bethanechol -carbamazepine -certain medicines for bladder problems like oxybutynin,  tolterodine -certain medicines for Parkinson's disease like benztropine, trihexyphenidyl -certain medicines for stomach problems like dicyclomine, hyoscyamine -certain medicines for travel sickness like scopolamine -dexamethasone -ipratropium -NSAIDs, medicines for pain and inflammation, like ibuprofen or naproxen -other medicines for Alzheimer's disease -other medicines that prolong the QT interval (cause an abnormal heart rhythm) -phenobarbital -phenytoin -rifampin, rifabutin or rifapentine This list may not describe all possible interactions. Give your health care provider a list of all the medicines, herbs, non-prescription drugs, or dietary supplements you use. Also tell them if you smoke, drink alcohol, or use illegal drugs. Some items may interact with your medicine. What should I watch for while using this medicine? Visit your doctor or health care professional for regular checks on your progress. Check with your doctor or health care professional if your symptoms do not get better or if they get worse. You may get drowsy or dizzy. Do not drive, use machinery, or do anything that needs mental alertness until you know how this drug affects you. What side effects may I notice from receiving this medicine? Side effects that you should report to your doctor or health care professional as soon as possible: -allergic reactions like skin rash, itching or hives, swelling of the face, lips, or tongue -changes in vision -feeling faint or lightheaded, falls -problems with balance -redness, blistering, peeling or loosening of the skin, including inside the mouth -slow heartbeat, or palpitations -stomach pain -unusual bleeding or bruising, red or purple spots on the skin -vomiting -weight loss Side effects that usually do not require medical attention (report to your doctor or health care professional if they continue or are bothersome): -diarrhea, especially when starting  treatment -headache -indigestion or heartburn -loss of appetite -muscle cramps -nausea This list may not describe all possible side effects. Call your doctor for medical advice about side effects. You may report side effects to FDA at 1-800-FDA-1088. Where should I keep my medicine? Keep out of reach of children. Store at room temperature between 15 and 30 degrees C (59 and 86 degrees F). Throw away any unused medicine after the expiration date. NOTE: This sheet is a summary. It may not cover all possible information. If you have questions about this medicine, talk to your doctor, pharmacist, or health care provider.    2016, Elsevier/Gold Standard. (2014-04-07 07:51:52)

## 2015-08-09 NOTE — Telephone Encounter (Signed)
It appears Dr Brett Fairy already sent this Rx to [facility] today at [place].  I called back.  Got no answer.  Left message.

## 2015-08-10 ENCOUNTER — Telehealth: Payer: Self-pay | Admitting: Neurology

## 2015-08-10 NOTE — Telephone Encounter (Signed)
Pt called said Andrew Oconnor (REQUIP) 0.25 MG tablet and donepezil (ARICEPT) 5 MG tablet did not work last night. He said wife sts he was thrashing his arms around which is not ordinary for him. He did sleep very well. Please call and advise

## 2015-08-10 NOTE — Telephone Encounter (Signed)
Spoke to pt. He actually didn't take requip last night, the pharmacy didn't have it available for pick up.  He took klonopin 1 mg at about 5 pm, and another 1 mg at 10:30. He also took the aricept. Pt reports that his wife thought he thrashed his arms last night, which is not usual for him. I advised pt to start taking requip as prescribed by Dr. Brett Fairy at night and let us know in 7-14 days if he notices a difference in his RLS and thrashing at night. Pt verbalized understanding.

## 2015-08-11 ENCOUNTER — Other Ambulatory Visit: Payer: Self-pay | Admitting: *Deleted

## 2015-08-14 ENCOUNTER — Other Ambulatory Visit: Payer: Self-pay | Admitting: Cardiology

## 2015-08-14 ENCOUNTER — Telehealth: Payer: Self-pay | Admitting: Neurology

## 2015-08-14 MED ORDER — METOPROLOL SUCCINATE ER 25 MG PO TB24: 25.0000 mg | ORAL_TABLET | Freq: Every day | ORAL | Status: DC

## 2015-08-14 MED ORDER — ROPINIROLE HCL 0.25 MG PO TABS: 0.5000 mg | ORAL_TABLET | Freq: Every day | ORAL | Status: DC

## 2015-08-14 NOTE — Telephone Encounter (Signed)
Spoke to [place] at [facility] and advised him that the RX for requip was changed to 60 tablets because he is taking 2 of the 0.25 mg tablets at bedtime. Andrew Oconnor verbalized understanding.

## 2015-08-14 NOTE — Telephone Encounter (Signed)
Spoke to pt. He reports that when he is awake, his arms and legs have an irresistible urge to move. He thinks that his medication is not working. I asked him when he started the requip, and he reports starting it either Friday or Saturday.   I spoke to Dr. Brett Fairy about pt feeling like his legs and arms must move, and he doesn't think the requip is working. Dr. Brett Fairy advised me to tell the pt to take 2 tablets of the requip to make 0.5 mg at bedtime. He currently is taking 0.25 mg requip at bedtime. Pt verbalized understanding to take 2 tablets of requip (0.25mg  each) at bedtime and call us on Friday or Monday and let me know how it is doing.

## 2015-08-14 NOTE — Telephone Encounter (Signed)
Patient called to advise medication is not working

## 2015-08-14 NOTE — Addendum Note (Signed)
Addended by: Lester [place] A on: 08/14/2015 12:18 PM   Modules accepted: Orders

## 2015-08-14 NOTE — Telephone Encounter (Signed)
[facility] [phone] called to verify rOPINIRole (REQUIP) 0.25 MG tablet, patient has previously been taking 1 at bedtime, new Rx says 2 at bedtime however quantity is only 30. Please call to give clarification.

## 2015-08-21 ENCOUNTER — Other Ambulatory Visit: Payer: Self-pay | Admitting: Neurology

## 2015-08-25 ENCOUNTER — Other Ambulatory Visit: Payer: Self-pay | Admitting: Neurology

## 2015-08-25 ENCOUNTER — Telehealth: Payer: Self-pay | Admitting: Neurology

## 2015-08-25 DIAGNOSIS — G253 Myoclonus: Secondary | ICD-10-CM

## 2015-08-25 DIAGNOSIS — G4752 REM sleep behavior disorder: Secondary | ICD-10-CM

## 2015-08-25 DIAGNOSIS — G2581 Restless legs syndrome: Secondary | ICD-10-CM

## 2015-08-25 DIAGNOSIS — G3184 Mild cognitive impairment, so stated: Secondary | ICD-10-CM

## 2015-08-25 DIAGNOSIS — G4733 Obstructive sleep apnea (adult) (pediatric): Secondary | ICD-10-CM

## 2015-08-25 DIAGNOSIS — Z9989 Dependence on other enabling machines and devices: Secondary | ICD-10-CM

## 2015-08-25 MED ORDER — DONEPEZIL HCL 5 MG PO TABS: 5.0000 mg | ORAL_TABLET | Freq: Every day | ORAL | Status: DC

## 2015-08-25 MED ORDER — ROPINIROLE HCL 0.25 MG PO TABS: 0.5000 mg | ORAL_TABLET | Freq: Every day | ORAL | Status: DC

## 2015-08-25 NOTE — Telephone Encounter (Signed)
Dr Brett Fairy, unsure why this message was forwarded to you.  Please disregard.   I called back and spoke with the patient.  We went over his medications.  He said the pharmacy has been misplacing both his and his wife's Rx's. He is very upset with them ([facility]) because if this.  Says they told him he is out if refills on Amitriptyline and Clonazepam, although we show refills should be on file.  I asked if he would like to change pharmacies, but he declined at this time.  Told him I would call the pharmacy and verify they have all of his Rx's from [place].  Advised we will be happy to send prescriptions to alternate pharmacy in the future if he chooses to change.  Patient expressed understanding and appreciation.  He is agreeable to this plan.  I called [facility].  Spoke with pharmacist who placed me on hold for several minutes, then verified there are refills on file.

## 2015-08-25 NOTE — Telephone Encounter (Signed)
Pt called and is confused about which medications to take at night and needs some help. Pt needs refill on donepezil (ARICEPT) 5 MG tablet and rOPINIRole (REQUIP) 0.25 MG tablet. Thank you

## 2015-09-07 ENCOUNTER — Other Ambulatory Visit: Payer: Self-pay | Admitting: Neurology

## 2015-09-07 DIAGNOSIS — F329 Major depressive disorder, single episode, unspecified: Secondary | ICD-10-CM | POA: Diagnosis not present

## 2015-09-15 ENCOUNTER — Encounter: Payer: Self-pay | Admitting: Neurology

## 2015-09-15 ENCOUNTER — Telehealth: Payer: Self-pay | Admitting: Neurology

## 2015-09-15 NOTE — Telephone Encounter (Signed)
LEFT VM on patient mobile phone- CD The office closed at noon, I am glad that sandra still took this call. The requip may have caused psychiatric side effects. 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  Dementia was noted in [facility].  Patient's hallucinations should not get worse on KLONOPIN, but can get worse on REQUIP or ARICEPT.  I need to know who his psychiatrist is to get in contact with him/her and the patient should have an urgent visit with psychiatry, stop Requip and stop Aricept.  CD Asked him to make ASAP visit with psychiatry and call us with the name and Phone number.

## 2015-09-15 NOTE — Telephone Encounter (Signed)
Pt called said he has had suicidal thoughts for a few weeks but [facility]. He saw a psychiatrist today who said some of the medications could hurting him instead of helping and said he needed to talk with provider who prescribed medications. He also has a call to Dr Osborne Casco PCP. Pt said on 09/05/15 he had been drinking and didn't want to live anymore. He called the Sheriff's dept to come and get him. He said 7 deputies came and he became upset and was arrested for 2 counts on a government official and one charge communicating threats. Please call and advise pt.

## 2015-09-19 NOTE — Telephone Encounter (Signed)
Received this message in mychart from pt :as requested, the psych name is Dr. Letitia Caul. He works at the [employer] # [phone]. Also Dr. Domenick Gong is making an appointment with another Physichrosist which I don't think that is needed.  I called pt. He says he has stopped the requip. I advised him that Dr. Brett Fairy wanted him to stop the requip AND aricept. He said he thought it was just the requip but will stop the aricept today. He says that he has seen Dr. Hoyle Barr at [facility] but that psychiatrist that Dr. Osborne Casco referred him to did not accept his insurance. I advised pt to call Dr. Loren Racer office today and advise Dr. Osborne Casco of this and ask him to send pt to another psychiatrist. Pt stated he would do this today. Pt verbalized understanding.

## 2015-10-03 NOTE — Patient Instructions (Addendum)
Your procedure is scheduled on: 10/09/2015  Report to [facility] at  700  AM.  Call this number if you have problems the morning of surgery: [phone]   Do not eat food or drink liquids :After Midnight.      Take these medicines the morning of surgery with A SIP OF WATER: benazepril, celexa, klonopin, levothyroxine, metoprolol, prilosec. DO NOT take any medicines for your diabetes the morning of  Surgery.   Decrease basal rate on insulin pump by 30% upon awakening am of procedure and we will check your sugar on arrival and adjust accordingly.   Do not wear jewelry, make-up or nail polish.  Do not wear lotions, powders, or perfumes. You may wear deodorant.  Do not shave 48 hours prior to surgery.  Do not bring valuables to the hospital.  Contacts, dentures or bridgework may not be worn into surgery.  Leave suitcase in the car. After surgery it may be brought to your room.  For patients admitted to the hospital, checkout time is 11:00 AM the day of discharge.   Patients discharged the day of surgery will not be allowed to drive home.  :     Please read over the following fact sheets that you were given: Coughing and Deep Breathing, Surgical Site Infection Prevention, Anesthesia Post-op Instructions and Care and Recovery After Surgery    Cataract A cataract is a clouding of the lens of the eye. When a lens becomes cloudy, vision is reduced based on the degree and nature of the clouding. Many cataracts reduce vision to some degree. Some cataracts make people more near-sighted as they develop. Other cataracts increase glare. Cataracts that are ignored and become worse can sometimes look white. The white color can be seen through the pupil. CAUSES   Aging. However, cataracts may occur at any age, even in newborns.   Certain drugs.   Trauma to the eye.   Certain diseases such as diabetes.   Specific eye diseases such as chronic inflammation inside the eye or a sudden attack of a rare  form of glaucoma.   Inherited or acquired medical problems.  SYMPTOMS   Gradual, progressive drop in vision in the affected eye.   Severe, rapid visual loss. This most often happens when trauma is the cause.  DIAGNOSIS  To detect a cataract, an eye doctor examines the lens. Cataracts are best diagnosed with an exam of the eyes with the pupils enlarged (dilated) by drops.  TREATMENT  For an early cataract, vision may improve by using different eyeglasses or stronger lighting. If that does not help your vision, surgery is the only effective treatment. A cataract needs to be surgically removed when vision loss interferes with your everyday activities, such as driving, reading, or watching TV. A cataract may also have to be removed if it prevents examination or treatment of another eye problem. Surgery removes the cloudy lens and usually replaces it with a substitute lens (intraocular lens, IOL).  At a time when both you and your doctor agree, the cataract will be surgically removed. If you have cataracts in both eyes, only one is usually removed at a time. This allows the operated eye to heal and be out of danger from any possible problems after surgery (such as infection or poor wound healing). In rare cases, a cataract may be doing damage to your eye. In these cases, your caregiver may advise surgical removal right away. The vast majority of people who have cataract surgery have  better vision afterward. HOME CARE INSTRUCTIONS  If you are not planning surgery, you may be asked to do the following:  Use different eyeglasses.   Use stronger or brighter lighting.   Ask your eye doctor about reducing your medicine dose or changing medicines if it is thought that a medicine caused your cataract. Changing medicines does not make the cataract go away on its own.   Become familiar with your surroundings. Poor vision can lead to injury. Avoid bumping into things on the affected side. You are at a higher  risk for tripping or falling.   Exercise extreme care when driving or operating machinery.   Wear sunglasses if you are sensitive to bright light or experiencing problems with glare.  SEEK IMMEDIATE MEDICAL CARE IF:   You have a worsening or sudden vision loss.   You notice redness, swelling, or increasing pain in the eye.   You have a fever.  Document Released: 08/26/2005 Document Revised: 08/15/2011 Document Reviewed: 04/19/2011 [facility] Patient Information 2012 [place].PATIENT INSTRUCTIONS POST-ANESTHESIA  IMMEDIATELY FOLLOWING SURGERY:  Do not drive or operate machinery for the first twenty four hours after surgery.  Do not make any important decisions for twenty four hours after surgery or while taking narcotic pain medications or sedatives.  If you develop intractable nausea and vomiting or a severe headache please notify your doctor immediately.  FOLLOW-UP:  Please make an appointment with your surgeon as instructed. You do not need to follow up with anesthesia unless specifically instructed to do so.  WOUND CARE INSTRUCTIONS (if applicable):  Keep a dry clean dressing on the anesthesia/puncture wound site if there is drainage.  Once the wound has quit draining you may leave it open to air.  Generally you should leave the bandage intact for twenty four hours unless there is drainage.  If the epidural site drains for more than 36-48 hours please call the anesthesia department.  QUESTIONS?:  Please feel free to call your physician or the hospital operator if you have any questions, and they will be happy to assist you.

## 2015-10-04 ENCOUNTER — Encounter (HOSPITAL_COMMUNITY): Payer: Self-pay

## 2015-10-04 ENCOUNTER — Encounter (HOSPITAL_COMMUNITY)
Admission: RE | Admit: 2015-10-04 | Discharge: 2015-10-04 | Disposition: A | Discharge status: Home / Self Care | Payer: Medicare Other | Source: Ambulatory Visit | Attending: Ophthalmology | Admitting: Ophthalmology

## 2015-10-04 DIAGNOSIS — H269 Unspecified cataract: Secondary | ICD-10-CM | POA: Insufficient documentation

## 2015-10-04 DIAGNOSIS — Z01812 Encounter for preprocedural laboratory examination: Secondary | ICD-10-CM | POA: Diagnosis not present

## 2015-10-04 HISTORY — DX: Heart failure, unspecified: I50.9

## 2015-10-04 LAB — CBC WITH DIFFERENTIAL/PLATELET
Basophils Absolute: 0 10*3/uL (ref 0.0–0.1)
Basophils Relative: 0 %
Eosinophils Absolute: 0.4 10*3/uL (ref 0.0–0.7)
Eosinophils Relative: 5 %
HCT: 43.1 % (ref 39.0–52.0)
Hemoglobin: 15.6 g/dL (ref 13.0–17.0)
Lymphocytes Relative: 32 %
Lymphs Abs: 2.7 10*3/uL (ref 0.7–4.0)
MCH: 31.8 pg (ref 26.0–34.0)
MCHC: 36.2 g/dL — ABNORMAL HIGH (ref 30.0–36.0)
MCV: 87.8 fL (ref 78.0–100.0)
Monocytes Absolute: 0.4 10*3/uL (ref 0.1–1.0)
Monocytes Relative: 5 %
Neutro Abs: 4.8 10*3/uL (ref 1.7–7.7)
Neutrophils Relative %: 58 %
Platelets: 240 10*3/uL (ref 150–400)
RBC: 4.91 MIL/uL (ref 4.22–5.81)
RDW: 12.3 % (ref 11.5–15.5)
WBC: 8.4 10*3/uL (ref 4.0–10.5)

## 2015-10-04 LAB — BASIC METABOLIC PANEL
Anion gap: 10 (ref 5–15)
BUN: 17 mg/dL (ref 6–20)
CO2: 24 mmol/L (ref 22–32)
Calcium: 8.9 mg/dL (ref 8.9–10.3)
Chloride: 101 mmol/L (ref 101–111)
Creatinine, Ser: 0.79 mg/dL (ref 0.61–1.24)
GFR calc Af Amer: >60 mL/min (ref >60)
GFR calc non Af Amer: >60 mL/min (ref >60)
Glucose, Bld: 267 mg/dL — ABNORMAL HIGH (ref 65–99)
Potassium: 4.1 mmol/L (ref 3.5–5.1)
Sodium: 135 mmol/L (ref 135–145)

## 2015-10-04 NOTE — Pre-Procedure Instructions (Addendum)
Per advice by Dr Patsey Berthold, patient instructed to decrease basal rate by 30 %, on insulin pump upon awakening am of procedure.

## 2015-10-06 MED ORDER — CYCLOPENTOLATE-PHENYLEPHRINE OP SOLN OPTIME - NO CHARGE
OPHTHALMIC | Status: AC
  Filled 2015-10-06: qty 2

## 2015-10-06 MED ORDER — TETRACAINE HCL 0.5 % OP SOLN
OPHTHALMIC | Status: AC
  Filled 2015-10-06: qty 4

## 2015-10-06 MED ORDER — LIDOCAINE HCL 3.5 % OP GEL
OPHTHALMIC | Status: AC
  Filled 2015-10-06: qty 1

## 2015-10-09 ENCOUNTER — Ambulatory Visit (HOSPITAL_COMMUNITY)
Admission: RE | Admit: 2015-10-09 | Discharge: 2015-10-09 | Disposition: A | Discharge status: Home / Self Care | Payer: Medicare Other | Source: Ambulatory Visit | Attending: Ophthalmology | Admitting: Ophthalmology

## 2015-10-09 ENCOUNTER — Ambulatory Visit (HOSPITAL_COMMUNITY): Payer: Medicare Other | Admitting: Anesthesiology

## 2015-10-09 ENCOUNTER — Encounter (HOSPITAL_COMMUNITY)
Admission: RE | Disposition: A | Discharge status: Home / Self Care | Payer: Self-pay | Source: Ambulatory Visit | Attending: Ophthalmology

## 2015-10-09 DIAGNOSIS — Z79899 Other long term (current) drug therapy: Secondary | ICD-10-CM | POA: Insufficient documentation

## 2015-10-09 DIAGNOSIS — K219 Gastro-esophageal reflux disease without esophagitis: Secondary | ICD-10-CM | POA: Insufficient documentation

## 2015-10-09 DIAGNOSIS — F418 Other specified anxiety disorders: Secondary | ICD-10-CM | POA: Diagnosis not present

## 2015-10-09 DIAGNOSIS — E039 Hypothyroidism, unspecified: Secondary | ICD-10-CM | POA: Insufficient documentation

## 2015-10-09 DIAGNOSIS — H268 Other specified cataract: Secondary | ICD-10-CM | POA: Diagnosis present

## 2015-10-09 DIAGNOSIS — G4730 Sleep apnea, unspecified: Secondary | ICD-10-CM | POA: Diagnosis not present

## 2015-10-09 DIAGNOSIS — Z794 Long term (current) use of insulin: Secondary | ICD-10-CM | POA: Diagnosis not present

## 2015-10-09 DIAGNOSIS — Z7982 Long term (current) use of aspirin: Secondary | ICD-10-CM | POA: Diagnosis not present

## 2015-10-09 DIAGNOSIS — E119 Type 2 diabetes mellitus without complications: Secondary | ICD-10-CM | POA: Diagnosis not present

## 2015-10-09 DIAGNOSIS — I110 Hypertensive heart disease with heart failure: Secondary | ICD-10-CM | POA: Diagnosis not present

## 2015-10-09 DIAGNOSIS — Z87891 Personal history of nicotine dependence: Secondary | ICD-10-CM | POA: Insufficient documentation

## 2015-10-09 HISTORY — PX: CATARACT EXTRACTION W/PHACO: SHX586

## 2015-10-09 LAB — GLUCOSE, CAPILLARY: Glucose-Capillary: 250 mg/dL — ABNORMAL HIGH (ref 65–99)

## 2015-10-09 SURGERY — PHACOEMULSIFICATION, CATARACT, WITH IOL INSERTION
Anesthesia: Monitor Anesthesia Care | Site: Eye | Laterality: Right

## 2015-10-09 MED ORDER — LACTATED RINGERS IV SOLN
INTRAVENOUS | Status: DC
  Administered 2015-10-09: 08:00:00 via INTRAVENOUS

## 2015-10-09 MED ORDER — TETRACAINE HCL 0.5 % OP SOLN
1.0000 [drp] | OPHTHALMIC | Status: AC | PRN
  Administered 2015-10-09 (×3): 1 [drp] via OPHTHALMIC

## 2015-10-09 MED ORDER — NA HYALUR & NA CHOND-NA HYALUR 0.55-0.5 ML IO KIT
PACK | INTRAOCULAR | Status: DC | PRN
Start: 2015-10-09 — End: 2015-10-09
  Administered 2015-10-09: 1 via OPHTHALMIC

## 2015-10-09 MED ORDER — MIDAZOLAM HCL 2 MG/2ML IJ SOLN
1.0000 mg | INTRAMUSCULAR | Status: DC | PRN
  Administered 2015-10-09: 2 mg via INTRAVENOUS

## 2015-10-09 MED ORDER — FENTANYL CITRATE (PF) 100 MCG/2ML IJ SOLN
25.0000 ug | INTRAMUSCULAR | Status: AC
  Administered 2015-10-09 (×2): 25 ug via INTRAVENOUS

## 2015-10-09 MED ORDER — CYCLOPENTOLATE-PHENYLEPHRINE 0.2-1 % OP SOLN
1.0000 [drp] | OPHTHALMIC | Status: AC
  Administered 2015-10-09 (×3): 1 [drp] via OPHTHALMIC

## 2015-10-09 MED ORDER — FENTANYL CITRATE (PF) 100 MCG/2ML IJ SOLN
INTRAMUSCULAR | Status: DC | PRN
  Administered 2015-10-09 (×2): 25 ug via INTRAVENOUS

## 2015-10-09 MED ORDER — LIDOCAINE HCL 3.5 % OP GEL: 1.0000 "application " | Freq: Once | OPHTHALMIC | Status: DC

## 2015-10-09 MED ORDER — MIDAZOLAM HCL 2 MG/2ML IJ SOLN
INTRAMUSCULAR | Status: AC
  Filled 2015-10-09: qty 2

## 2015-10-09 MED ORDER — PHENYLEPHRINE-KETOROLAC 1-0.3 % IO SOLN
INTRAOCULAR | Status: DC | PRN
  Administered 2015-10-09: 500 mL via OPHTHALMIC

## 2015-10-09 MED ORDER — TETRACAINE 0.5 % OP SOLN OPTIME - NO CHARGE
OPHTHALMIC | Status: DC | PRN
  Administered 2015-10-09: 1 [drp] via OPHTHALMIC

## 2015-10-09 MED ORDER — POVIDONE-IODINE 5 % OP SOLN
OPHTHALMIC | Status: DC | PRN
  Administered 2015-10-09: 1 via OPHTHALMIC

## 2015-10-09 MED ORDER — FENTANYL CITRATE (PF) 100 MCG/2ML IJ SOLN
INTRAMUSCULAR | Status: AC
  Filled 2015-10-09: qty 2

## 2015-10-09 MED ORDER — MIDAZOLAM HCL 2 MG/2ML IJ SOLN
INTRAMUSCULAR | Status: DC | PRN
  Administered 2015-10-09 (×2): 1 mg via INTRAVENOUS

## 2015-10-09 MED ORDER — LIDOCAINE HCL 3.5 % OP GEL
OPHTHALMIC | Status: DC | PRN
  Administered 2015-10-09: 1 via OPHTHALMIC

## 2015-10-09 MED ORDER — BSS IO SOLN
INTRAOCULAR | Status: DC | PRN
  Administered 2015-10-09: 15 mL

## 2015-10-09 SURGICAL SUPPLY — 8 items
CLOTH BEACON ORANGE TIMEOUT ST (SAFETY) ×2 IMPLANT
GLOVE BIOGEL PI IND STRL 7.0 (GLOVE) ×1 IMPLANT
GLOVE BIOGEL PI INDICATOR 7.0 (GLOVE) ×1
GLOVE EXAM NITRILE MD LF STRL (GLOVE) ×2 IMPLANT
INST SET CATARACT ~~LOC~~ (KITS) ×2 IMPLANT
LENS ALC ACRYL/TECN (Ophthalmic Related) ×2 IMPLANT
PAD ARMBOARD 7.5X6 YLW CONV (MISCELLANEOUS) ×2 IMPLANT
WATER STERILE IRR 250ML POUR (IV SOLUTION) ×2 IMPLANT

## 2015-10-09 NOTE — H&P (Signed)
I have reviewed the pre printed H&P, the patient was re-examined, and I have identified no significant interval changes in the patient's medical condition.  There is no change in the plan of care since the history and physical of record. 

## 2015-10-09 NOTE — Op Note (Signed)
10/09/2015  9:10 AM  PATIENT:  Andrew Oconnor  59 y.o. male  PRE-OPERATIVE DIAGNOSIS:  nuclear cataract right eye  POST-OPERATIVE DIAGNOSIS:  nuclear cataract right eye  PROCEDURE:  Procedure(s): CATARACT EXTRACTION PHACO AND INTRAOCULAR LENS PLACEMENT ([place])  SURGEON:  Surgeon(s): Williams Che, MD  ASSISTANTS: Claudius Sis, CST   ANESTHESIA STAFF: Anesthesiologist: Lerry Liner, MD CRNA: Vista Deck, CRNA  ANESTHESIA:   topical and MAC  REQUESTED LENS POWER: 21.5  LENS IMPLANT INFORMATION:   Alcon SN60WF  S/n [id number]  Exp 03/08/2020  CUMULATIVE DISSIPATED ENERGY:2.17  INDICATIONS:see scanned office H&P for particulars  OP FINDINGS:mod. dense NS  COMPLICATIONS:None  PROCEDURE:  The patient was brought to the operating room in good condition.  The operative eye was prepped and draped in the usual fashion for intraocular surgery.  Lidocaine gel was dropped onto the eye.  A 2.4 mm 10 O'clock near clear corneal stepped incision and a 12 O'clock stab incision were created.  Viscoat was instilled into the anterior chamber.  The 5 mm anterior capsulorhexis was performed with a bent needle cystotome and Utrata forceps.  The lens was hydrodissected and hydrodelineated with a cannula and balanced salt solution and rotated with a Kuglen hook.  Phacoemulsification was perfomed in the divide and conquer technique.  The remaining cortex was removed with I&A and the capsular surfaces polished as necessary.  Provisc was placed into the capsular bag and the lens inserted with the Alcon inserter.  The viscoelastic was removed with I&A and the lens "rocked" into position.  The wounds were hydrated and te anterior chamber was refilled with balanced salt solution.  The wounds were checked for leakage and rehydrated as necessary.  The lid speculum and drapes were removed and the patient was transported to short stay in good condition.  PATIENT DISPOSITION:  Short Stay

## 2015-10-09 NOTE — Anesthesia Procedure Notes (Signed)
Procedure Name: MAC Date/Time: 10/09/2015 8:33 AM Performed by: Vista Deck Pre-anesthesia Checklist: Patient identified, Emergency Drugs available, Suction available, Timeout performed and Patient being monitored Patient Re-evaluated:Patient Re-evaluated prior to inductionOxygen Delivery Method: Nasal Cannula

## 2015-10-09 NOTE — Brief Op Note (Signed)
10/09/2015  9:10 AM  PATIENT:  Andrew Oconnor  60 y.o. male  PRE-OPERATIVE DIAGNOSIS:  nuclear cataract right eye  POST-OPERATIVE DIAGNOSIS:  nuclear cataract right eye  PROCEDURE:  Procedure(s): CATARACT EXTRACTION PHACO AND INTRAOCULAR LENS PLACEMENT ([place])  SURGEON:  Surgeon(s): Williams Che, MD  ASSISTANTS: Claudius Sis, CST   ANESTHESIA STAFF: Anesthesiologist: Lerry Liner, MD CRNA: Vista Deck, CRNA  ANESTHESIA:   topical and MAC  REQUESTED LENS POWER: 21.5  LENS IMPLANT INFORMATION:   Alcon SN60WF  S/n [id number]  Exp 03/08/2020  CUMULATIVE DISSIPATED ENERGY:2.17  INDICATIONS:see scanned office H&P for particulars  OP FINDINGS:mod. dense NS  COMPLICATIONS:None  DICTATION #: none  PLAN OF CARE: as above  PATIENT DISPOSITION:  Short Stay

## 2015-10-09 NOTE — Anesthesia Preprocedure Evaluation (Signed)
Anesthesia Evaluation  Patient identified by MRN, date of birth, ID band Patient awake    Reviewed: Allergy & Precautions, NPO status , Patient's Chart, lab work & pertinent test results  Airway Mallampati: II  TM Distance: >3 FB     Dental  (+) Teeth Intact   Pulmonary sleep apnea , former smoker,    breath sounds clear to auscultation       Cardiovascular hypertension, Pt. on medications +CHF   Rhythm:Regular Rate:Normal     Neuro/Psych PSYCHIATRIC DISORDERS Anxiety Depression    GI/Hepatic hiatal hernia, GERD  ,  Endo/Other  diabetes, Type 2, Insulin DependentHypothyroidism   Renal/GU      Musculoskeletal   Abdominal   Peds  Hematology   Anesthesia Other Findings   Reproductive/Obstetrics                             Anesthesia Physical Anesthesia Plan  ASA: III  Anesthesia Plan: MAC   Post-op Pain Management:    Induction: Intravenous  Airway Management Planned: Simple Face Mask  Additional Equipment:   Intra-op Plan:   Post-operative Plan:   Informed Consent: I have reviewed the patients History and Physical, chart, labs and discussed the procedure including the risks, benefits and alternatives for the proposed anesthesia with the patient or authorized representative who has indicated his/her understanding and acceptance.     Plan Discussed with:   Anesthesia Plan Comments:         Anesthesia Quick Evaluation

## 2015-10-09 NOTE — Anesthesia Postprocedure Evaluation (Signed)
  Anesthesia Post-op Note  Patient: Andrew Oconnor  Procedure(s) Performed: Procedure(s) (LRB): CATARACT EXTRACTION PHACO AND INTRAOCULAR LENS PLACEMENT ([facility]) (Right)  Patient Location:  Short Stay  Anesthesia Type: MAC  Level of Consciousness: awake  Airway and Oxygen Therapy: Patient Spontanous Breathing  Post-op Pain: none  Post-op Assessment: Post-op Vital signs reviewed, Patient's Cardiovascular Status Stable, Respiratory Function Stable, Patent Airway, No signs of Nausea or vomiting and Pain level controlled  Post-op Vital Signs: Reviewed and stable  Complications: No apparent anesthesia complications

## 2015-10-09 NOTE — Transfer of Care (Signed)
Immediate Anesthesia Transfer of Care Note  Patient: Andrew Oconnor  Procedure(s) Performed: Procedure(s) (LRB): CATARACT EXTRACTION PHACO AND INTRAOCULAR LENS PLACEMENT ([facility]) (Right)  Patient Location: Shortstay  Anesthesia Type: MAC  Level of Consciousness: awake  Airway & Oxygen Therapy: Patient Spontanous Breathing   Post-op Assessment: Report given to PACU RN, Post -op Vital signs reviewed and stable and Patient moving all extremities  Post vital signs: Reviewed and stable  Complications: No apparent anesthesia complications

## 2015-10-10 ENCOUNTER — Encounter (HOSPITAL_COMMUNITY): Payer: Self-pay | Admitting: Ophthalmology

## 2015-10-30 ENCOUNTER — Telehealth: Payer: Self-pay | Admitting: Neurology

## 2015-11-01 ENCOUNTER — Ambulatory Visit: Payer: Medicare PPO | Admitting: Adult Health

## 2015-11-08 ENCOUNTER — Ambulatory Visit: Payer: Medicare PPO | Admitting: Adult Health

## 2015-11-29 ENCOUNTER — Ambulatory Visit: Payer: Medicare PPO | Admitting: Adult Health

## 2015-11-30 ENCOUNTER — Encounter: Payer: Self-pay | Admitting: Adult Health

## 2015-12-08 ENCOUNTER — Encounter: Payer: Self-pay | Admitting: Cardiology

## 2015-12-08 ENCOUNTER — Ambulatory Visit (INDEPENDENT_AMBULATORY_CARE_PROVIDER_SITE_OTHER): Payer: Medicare Other | Admitting: Cardiology

## 2015-12-08 VITALS — BP 130/76 | HR 60 | Ht <= 58 in | Wt 164.2 lb

## 2015-12-08 DIAGNOSIS — I5032 Chronic diastolic (congestive) heart failure: Secondary | ICD-10-CM | POA: Diagnosis not present

## 2015-12-08 DIAGNOSIS — I10 Essential (primary) hypertension: Secondary | ICD-10-CM | POA: Diagnosis not present

## 2015-12-08 DIAGNOSIS — E119 Type 2 diabetes mellitus without complications: Secondary | ICD-10-CM

## 2015-12-08 NOTE — Patient Instructions (Signed)
Continue your current medication  Restrict your salt intake,  Exercise more- try to do some aerobic exercise daily  I will see you in 6 months.

## 2015-12-09 NOTE — Progress Notes (Signed)
12/09/2015 Andrew Oconnor   February 24, 1956  [id number]  Primary Physician Haywood Pao, MD Primary Cardiologist: Dr. Peter Martinique  HPI:  Andrew Oconnor is seen for follow up diastolic CHF.  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 He also has a h/o HTN, HL, OSA and hypothyroidism. He is s/p cardiac cath  on 11/24/14. This demonstrated normal anatomy and normal LV function. EDP was elevated c/w diastolic dysfunction. Echo with EF 55-60%. LV diastolic function normal.   On follow up today he is doing OK. He is very sedentary. He does get SOB walking to his mailbox but not walking around his house. No edema. He occasionally feels tight in his chest. Weight has increased 5 lbs.    Current Outpatient Prescriptions  Medication Sig Dispense Refill  . amitriptyline (ELAVIL) 10 MG tablet TAKE ONE TABLET BY MOUTH AT BEDTIME AS NEEDED FOR SLEEP 90 tablet 2  . aspirin EC 325 MG EC tablet Take 1 tablet (325 mg total) by mouth daily. 30 tablet 0  . benazepril (LOTENSIN) 10 MG tablet     . Blood Glucose Calibration (OT ULTRA/FASTTK CNTRL SOLN) SOLN     . citalopram (CELEXA) 20 MG tablet     . clonazePAM (KLONOPIN) 1 MG tablet Take 1 tablet (1 mg total) by mouth 2 (two) times daily as needed for anxiety. 60 tablet 5  . donepezil (ARICEPT) 5 MG tablet TAKE ONE TABLET BY MOUTH AT BEDTIME 30 tablet 1  . levothyroxine (SYNTHROID, LEVOTHROID) 150 MCG tablet     . metoprolol succinate (TOPROL-XL) 25 MG 24 hr tablet Take 1 tablet (25 mg total) by mouth daily. 30 tablet 0  . NOVOLOG 100 UNIT/ML injection Inject 0-80 Units into the skin continuous. Insulin pump    . omeprazole (PRILOSEC) 20 MG capsule Take 40 mg by mouth daily.     . ONE TOUCH ULTRA TEST test strip     . pravastatin (PRAVACHOL) 40 MG tablet     . spironolactone-hydrochlorothiazide (ALDACTAZIDE) 25-25 MG per tablet Take 1 tablet by mouth daily.     No current facility-administered medications for this visit.    No Known Allergies  Social History     Social History  . Marital Status: Married    Spouse Name: Andrew Oconnor  . Number of Children: 0  . Years of Education: 12   Occupational History  . retired     Water quality scientist   Social History Main Topics  . Smoking status: Former Smoker    Quit date: 09/09/2002  . Smokeless tobacco: Never Used     Comment: quit smoking 2004  . Alcohol Use: Yes     Comment: occas.  . Drug Use: No  . Sexual Activity: Not on file   Other Topics Concern  . Not on file   Social History Narrative    may continue treatment of complex apnea with his current CPAP setting , good residual AHI of 3.3/   Has improved by [place] and has only once a night  nocturia , less shortness of breath.   gel mask fits well acc. to air leak data but has left a pressure mark.    He may need an alternative mask until it's healed. I recommended a swift FX medium pillows .   DME [employer] - machine , HCS with Nicole [facility]red,  RT.      Patient is married Andrew Oconnor) and lives at home with his wife and step-son.   Patient is retired.  Patient has a high school.   Patient is right-handed.   Patient drinks 1-2 cups of coffee every morning.              Review of Systems: As noted in HPI.  All other systems reviewed and are otherwise negative except as noted above.    Blood pressure 130/76, pulse 60, height 4\' 7"  (1.397 m), weight 74.481 kg (164 lb 3.2 oz), SpO2 98 %.  GENERAL:  Well appearing SM in NAD HEENT:  PERRL, EOMI, sclera are clear. Oropharynx is clear. NECK:  No jugular venous distention, carotid upstroke brisk and symmetric, no bruits, no thyromegaly or adenopathy LUNGS:  Clear to auscultation bilaterally CHEST:  Unremarkable HEART:  RRR,  PMI not displaced or sustained,S1 and S2 within normal limits, no S3, no S4: no clicks, no rubs, no murmurs ABD:  Soft, nontender. BS +, no masses or bruits. No hepatomegaly, no splenomegaly EXT:  2 + pulses throughout, no edema, no cyanosis no clubbing SKIN:  Warm and dry.   No rashes NEURO:  Alert and oriented x 3. Cranial nerves II through XII intact. PSYCH:  Cognitively intact    EKG today shows NSR with normal Ecg. I have personally reviewed and interpreted this study.   ASSESSMENT AND PLAN:    1. Chronic Diastolic CHF: patient appears to be evoulemic on physical exam.  Continue current dose of Aldactazide.  We discussed importance of sodium restriction. I think his current functional limitation and dyspnea are predominantly due to deconditioning. Recommend daily aerobic exercise.  Continue BB and ARB for BP control.  2. Palpitations: He is currently asymptomatic. Prior event monitor showed one 7 beat run NSVT.  For now, continue BB therapy with metoprolol.   3. DM type 2. Reports last A1c was 8.2%. Encourage weight loss and reduction in carbohydrate intake.  Follow up in 6 months.

## 2015-12-26 ENCOUNTER — Telehealth: Payer: Self-pay | Admitting: Cardiology

## 2015-12-26 NOTE — Telephone Encounter (Signed)
Left msg w/ information.

## 2015-12-26 NOTE — Telephone Encounter (Signed)
New message  The pt was reffered to the CHF program and will need the ejection fraction and the date that it was obtained.

## 2016-07-11 ENCOUNTER — Telehealth: Payer: Self-pay

## 2016-07-11 NOTE — Telephone Encounter (Signed)
Called patient received surgical clearance from [facility].Dr.Jordan out of office this week.Advised I will have Dr.Jordan sign clearance next week and fax back.[place] was notified.After reviewing your chart you are past due to see Dr.Jordan.Appointment scheduled with Dr.Jordan 09/16/16 at 1:30 pm.

## 2016-07-19 ENCOUNTER — Telehealth: Payer: Self-pay

## 2016-07-19 NOTE — Telephone Encounter (Signed)
Received clearance from [facility].Dr.Jordan cleared patient for upcoming surgery.Form faxed back to fax # [phone].

## 2016-08-06 ENCOUNTER — Telehealth: Payer: Self-pay | Admitting: Cardiology

## 2016-08-06 NOTE — Telephone Encounter (Signed)
Misty, RN at [employer] (with patient on the line) wanting to speak with RN here.  The patient has been gradually gaining weight and is SOB upon waking.  Misty would like for you to call the patient back to advise.

## 2016-08-06 NOTE — Telephone Encounter (Signed)
Unable to return call to [facility] with [facility] no phone number given.Spoke to patient he stated for the past 2 weeks he has been sob.Stated he notices sob more at night while at rest.No chest pain.Stated he has gained 4 lbs in the past 1 week.Stated his B/P has been elevated.Appointment scheduled with Rosaria Ferries PA 08/07/16 at 11:00 am.

## 2016-08-07 ENCOUNTER — Ambulatory Visit (INDEPENDENT_AMBULATORY_CARE_PROVIDER_SITE_OTHER): Payer: Medicare Other | Admitting: Physician Assistant

## 2016-08-07 ENCOUNTER — Encounter: Payer: Self-pay | Admitting: Physician Assistant

## 2016-08-07 VITALS — BP 115/68 | HR 64 | Ht <= 58 in | Wt 154.2 lb

## 2016-08-07 DIAGNOSIS — I10 Essential (primary) hypertension: Secondary | ICD-10-CM | POA: Diagnosis not present

## 2016-08-07 DIAGNOSIS — I5032 Chronic diastolic (congestive) heart failure: Secondary | ICD-10-CM

## 2016-08-07 MED ORDER — HYDROCHLOROTHIAZIDE 25 MG PO TABS: ORAL_TABLET | ORAL | 6 refills | Status: DC

## 2016-08-07 NOTE — Patient Instructions (Addendum)
Medication Instructions:   CHANGE LOTENSON TO MID DAY  TAKE HCTZ 25 MG 1/2 TABLETS AS NEEDED FOR BLOOD PRESSURE HIGHER THAN 160  Follow-Up:  Your physician recommends that you schedule a follow-up appointment WITH DR Martinique AS SCHEDULED   Low-Sodium Eating Plan Sodium raises blood pressure and causes water to be held in the body. Getting less sodium from food will help lower your blood pressure, reduce any swelling, and protect your heart, liver, and kidneys. We get sodium by adding salt (sodium chloride) to food. Most of our sodium comes from canned, boxed, and frozen foods. Restaurant foods, fast foods, and pizza are also very high in sodium. Even if you take medicine to lower your blood pressure or to reduce fluid in your body, getting less sodium from your food is important. What is my plan? Most people should limit their sodium intake to 2,300 mg a day. Your health care provider recommends that you limit your sodium intake to __________ a day. What do I need to know about this eating plan? For the low-sodium eating plan, you will follow these general guidelines:  Choose foods with a % Daily Value for sodium of less than 5% (as listed on the food label).  Use salt-free seasonings or herbs instead of table salt or sea salt.  Check with your health care provider or pharmacist before using salt substitutes.  Eat fresh foods.  Eat more vegetables and fruits.  Limit canned vegetables. If you do use them, rinse them well to decrease the sodium.  Limit cheese to 1 oz (28 g) per day.  Eat lower-sodium products, often labeled as "lower sodium" or "no salt added."  Avoid foods that contain monosodium glutamate (MSG). MSG is sometimes added to [place] food and some canned foods.  Check food labels (Nutrition Facts labels) on foods to learn how much sodium is in one serving.  Eat more home-cooked food and less restaurant, buffet, and fast food.  When eating at a restaurant, ask that  your food be prepared with less salt, or no salt if possible. How do I read food labels for sodium information? The Nutrition Facts label lists the amount of sodium in one serving of the food. If you eat more than one serving, you must multiply the listed amount of sodium by the number of servings. Food labels may also identify foods as:  Sodium free-Less than 5 mg in a serving.  Very low sodium-35 mg or less in a serving.  Low sodium-140 mg or less in a serving.  Light in sodium-50% less sodium in a serving. For example, if a food that usually has 300 mg of sodium is changed to become light in sodium, it will have 150 mg of sodium.  Reduced sodium-25% less sodium in a serving. For example, if a food that usually has 400 mg of sodium is changed to reduced sodium, it will have 300 mg of sodium. What foods can I eat? Grains  Low-sodium cereals, including oats, puffed wheat and rice, and shredded wheat cereals. Low-sodium crackers. Unsalted rice and pasta. Lower-sodium bread. Vegetables  Frozen or fresh vegetables. Low-sodium or reduced-sodium canned vegetables. Low-sodium or reduced-sodium tomato sauce and paste. Low-sodium or reduced-sodium tomato and vegetable juices. Fruits  Fresh, frozen, and canned fruit. Fruit juice. Meat and Other Protein Products  Low-sodium canned tuna and salmon. Fresh or frozen meat, poultry, seafood, and fish. Lamb. Unsalted nuts. Dried beans, peas, and lentils without added salt. Unsalted canned beans. Homemade soups without salt. Eggs.  Dairy  Milk. Soy milk. Ricotta cheese. Low-sodium or reduced-sodium cheeses. Yogurt. Condiments  Fresh and dried herbs and spices. Salt-free seasonings. Onion and garlic powders. Low-sodium varieties of mustard and ketchup. Fresh or refrigerated horseradish. Lemon juice. Fats and Oils  Reduced-sodium salad dressings. Unsalted butter. Other  Unsalted popcorn and pretzels. The items listed above may not be a complete list of  recommended foods or beverages. Contact your dietitian for more options.  What foods are not recommended? Grains  Instant hot cereals. Bread stuffing, pancake, and biscuit mixes. Croutons. Seasoned rice or pasta mixes. Noodle soup cups. Boxed or frozen macaroni and cheese. Self-rising flour. Regular salted crackers. Vegetables  Regular canned vegetables. Regular canned tomato sauce and paste. Regular tomato and vegetable juices. Frozen vegetables in sauces. Salted [place] fries. Olives. Angie Fava. Relishes. Sauerkraut. Salsa. Meat and Other Protein Products  Salted, canned, smoked, spiced, or pickled meats, seafood, or fish. Bacon, ham, sausage, hot dogs, corned beef, chipped beef, and packaged luncheon meats. Salt pork. Jerky. Pickled herring. Anchovies, regular canned tuna, and sardines. Salted nuts. Dairy  Processed cheese and cheese spreads. Cheese curds. Blue cheese and cottage cheese. Buttermilk. Condiments  Onion and garlic salt, seasoned salt, table salt, and sea salt. Canned and packaged gravies. Worcestershire sauce. Tartar sauce. Barbecue sauce. Teriyaki sauce. Soy sauce, including reduced sodium. Steak sauce. Fish sauce. Oyster sauce. Cocktail sauce. Horseradish that you find on the shelf. Regular ketchup and mustard. Meat flavorings and tenderizers. Bouillon cubes. Hot sauce. Tabasco sauce. Marinades. Taco seasonings. Relishes. Fats and Oils  Regular salad dressings. Salted butter. Margarine. Ghee. Bacon fat. Other  Potato and tortilla chips. Corn chips and puffs. Salted popcorn and pretzels. Canned or dried soups. Pizza. Frozen entrees and pot pies. The items listed above may not be a complete list of foods and beverages to avoid. Contact your dietitian for more information.  This information is not intended to replace advice given to you by your health care provider. Make sure you discuss any questions you have with your health care provider. Document Released: 02/15/2002 Document  Revised: 02/01/2016 Document Reviewed: 06/30/2013 Elsevier Interactive Patient Education  2017 [employer].

## 2016-08-07 NOTE — Progress Notes (Signed)
Cardiology Office Note   Date:  08/07/2016   ID:  Andrew Oconnor, DOB 31-Jul-1956, MRN [id number]  PCP:  Haywood Pao, MD  Cardiologist:  Dr Martinique 12/2015 Rosaria Ferries, PA-C   Chief Complaint  Patient presents with  . Shortness of Breath    History of Present Illness: Andrew Oconnor is a 60 y.o. male with a history of D-CHF, IDDM on insulin pump, HTN, HLD, OSA on CPAP, hypothyroid, nl cors by cath 11/2014, nl EF w/ diastolic dysfunction by echo 11/2014.  Andrew Oconnor presents for cardiac evaluation of weight gain.  He had lost down to 150 lbs, but weight has increased 4 lbs in 2-3 days.  BP runs high at times, 191/102 at one point.   He denies dietary indiscretion. His weight was 151 lbs the day after Thanksgiving, gained 3 more lbs in the next few days. No LE edema, but hands are a little puffy. He can walk a flight of stairs without stopping, did that this am, no problem. Describes orthopnea and PND. Has woken with a little chest pressure. He eats food from boxes and cans. Was not aware of hidden sodium in processed foods, is willing to be more vigilant about this.    Past Medical History:  Diagnosis Date  . Anxiety   . Carpal tunnel syndrome   . CHF (congestive heart failure) ([place])   . Colon polyp   . Depression   . Diabetes mellitus without complication ([place])   . Erectile dysfunction   . GERD (gastroesophageal reflux disease)   . Hiatal hernia   . History of diplopia     third nerve palsy 2013   . Hyperlipidemia   . Hypertension   . Obesity   . Sleep apnea with use of continuous positive airway pressure (CPAP)    diagnsed AHi of 20 in 2010 , titrated to 13 cm H20.  . Thyroid disorder     Past Surgical History:  Procedure Laterality Date  . CATARACT EXTRACTION W/PHACO Right 10/09/2015   Procedure: CATARACT EXTRACTION PHACO AND INTRAOCULAR LENS PLACEMENT (IOC);  Surgeon: Williams Che, MD;  Location: [facility];  Service: Ophthalmology;   Laterality: Right;  CDE:2.17  . HAND SURGERY Bilateral    secondary to nerve damage  . hemorrectomy    . LEFT HEART CATHETERIZATION WITH CORONARY ANGIOGRAM N/A 11/24/2014   Procedure: LEFT HEART CATHETERIZATION WITH CORONARY ANGIOGRAM;  Surgeon: Peter M Martinique, MD;  Location: [facility] CATH LAB;  Service: Cardiovascular;  Laterality: N/A;    Current Outpatient Prescriptions  Medication Sig Dispense Refill  . aspirin EC 325 MG EC tablet Take 1 tablet (325 mg total) by mouth daily. 30 tablet 0  . benazepril (LOTENSIN) 10 MG tablet     . Blood Glucose Calibration (OT ULTRA/FASTTK CNTRL SOLN) SOLN     . insulin lispro (HUMALOG) 100 UNIT/ML cartridge Inject 80 Units into the skin daily.    Marland Kitchen levothyroxine (SYNTHROID, LEVOTHROID) 150 MCG tablet     . metoprolol succinate (TOPROL-XL) 25 MG 24 hr tablet Take 1 tablet (25 mg total) by mouth daily. 30 tablet 0  . omeprazole (PRILOSEC) 40 MG capsule Take 40 mg by mouth daily.  2  . pravastatin (PRAVACHOL) 40 MG tablet     . spironolactone-hydrochlorothiazide (ALDACTAZIDE) 25-25 MG per tablet Take 1 tablet by mouth daily.     No current facility-administered medications for this visit.     Allergies:   Patient has no known allergies.  Social History:  The patient  reports that he quit smoking about 13 years ago. He has never used smokeless tobacco. He reports that he drinks alcohol. He reports that he does not use drugs.   Family History:  The patient's family history includes Cancer in his sister; Diabetes in his brother and father; Heart disease in his brother, father, maternal grandfather, maternal grandmother, mother, paternal grandfather, paternal grandmother, and sister; Seizures in his father; Stroke in his father and other.    ROS:  Please see the history of present illness. All other systems are reviewed and negative.    PHYSICAL EXAM: VS:  BP 115/68   Pulse 64   Ht 4\' 7"  (1.397 m)   Wt 154 lb 3.2 oz (69.9 kg)   BMI 35.84 kg/m  , BMI  Body mass index is 35.84 kg/m. GEN: Well nourished, well developed, male in no acute distress  HEENT: normal for age  Neck: JVD 9 cm, no carotid bruit, no masses Cardiac: RRR; soft murmur, no rubs, or gallops Respiratory:  clear to auscultation bilaterally, normal work of breathing GI: soft, nontender, nondistended, + BS MS: no deformity or atrophy; no edema; distal pulses are 2+ in all 4 extremities   Skin: warm and dry, no rash Neuro:  Strength and sensation are intact Psych: euthymic mood, full affect   EKG:  EKG is ordered today. The ekg ordered today demonstrates SR, No acute ischemic changes  ECHO: 11/2014 - Left ventricle: The cavity size was normal. Systolic function was normal. The estimated ejection fraction was in the range of 55% to 60%. Wall motion was normal; there were no regional wall motion abnormalities. Left ventricular diastolic function parameters were normal. - Left atrium: The atrium was mildly dilated. - Atrial septum: No defect or patent foramen ovale was identified. - Pericardium, extracardiac: A trivial pericardial effusion was identified posterior to the heart.   Recent Labs: 10/04/2015: BUN 17; Creatinine, Ser 0.79; Hemoglobin 15.6; Platelets 240; Potassium 4.1; Sodium 135    Lipid Panel No results found for: CHOL, TRIG, HDL, CHOLHDL, VLDL, LDLCALC, LDLDIRECT   Wt Readings from Last 3 Encounters:  08/07/16 154 lb 3.2 oz (69.9 kg)  12/08/15 164 lb 3.2 oz (74.5 kg)  10/04/15 159 lb (72.1 kg)     Other studies Reviewed: Additional studies/ records that were reviewed today include: office notes and previous testing.  ASSESSMENT AND PLAN:  1.  Chronic diastolic CHF: He has mild volume overload. He watches his intake carefully, he is willing to be more careful with processed foods. I do not think he needs Lasix, will give him extra HCTZ tabs to take prn for edema or weight gain.   2. HTN: BP may improve with volume improvement. Unclear  cause of BP spikes. Will ask him to track these. Change Lotensin to midday to make sure he has good 24 hour med coverage. If no improvement, OK to take extra HCTZ for BP spikes as well as edema.   Current medicines are reviewed at length with the patient today.  The patient does not have concerns regarding medicines.  The following changes have been made:  Additional HCTZ prn  Labs/ tests ordered today include:   Orders Placed This Encounter  Procedures  . EKG 12-Lead     Disposition:   FU with Dr Martinique  Signed, Rosaria Ferries, PA-C  08/07/2016 5:11 PM    [place] Phone: [phone]; Fax: [phone]  This note was written with  the assistance of speech recognition software. Please excuse any transcriptional errors.

## 2016-08-09 ENCOUNTER — Telehealth: Payer: Self-pay | Admitting: Cardiology

## 2016-08-09 NOTE — Telephone Encounter (Signed)
New Message  Misty voiced she is calling to get the pts last BP readings.  Misty voiced it can also be faxed to [phone].  Please f/u

## 2016-08-09 NOTE — Telephone Encounter (Signed)
Returned call to [employer] with [facility] wanting to know patient's last B/P reading.08/07/16 at office visit B/P 07/15/67.

## 2016-08-15 ENCOUNTER — Telehealth: Payer: Self-pay | Admitting: *Deleted

## 2016-08-15 DIAGNOSIS — I10 Essential (primary) hypertension: Secondary | ICD-10-CM

## 2016-08-15 NOTE — Telephone Encounter (Signed)
Patient left a msg on the refill vm stating that he was informed that he was being prescribed something similar to lasix. To date his pharmacy has not received anything. Patient can be reached at [phone] or [phone]. Thanks, MI

## 2016-08-16 MED ORDER — HYDROCHLOROTHIAZIDE 25 MG PO TABS: ORAL_TABLET | ORAL | 6 refills | Status: DC

## 2016-08-16 NOTE — Telephone Encounter (Signed)
Left message for pt, HCTZ was sent to the pharmacy 08-07-16. Refill sent to the pharmacy electronically.

## 2016-09-13 NOTE — Progress Notes (Deleted)
09/13/2016 Andrew Oconnor   03/12/1956  [id number]  Primary Physician Haywood Pao, MD Primary Cardiologist: Dr. Dutchess Crosland Martinique  HPI:  Mr Andrew Oconnor is seen for follow up diastolic CHF.  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 He also has a h/o HTN, HL, OSA and hypothyroidism. He is s/p cardiac cath  on 11/24/14. This demonstrated normal anatomy and normal LV function. EDP was elevated c/w diastolic dysfunction. Echo with EF 55-60%. LV diastolic function normal.   On follow up today he is doing OK. He is very sedentary. He does get SOB walking to his mailbox but not walking around his house. No edema. He occasionally feels tight in his chest. Weight has increased 5 lbs.    Current Outpatient Prescriptions  Medication Sig Dispense Refill  . aspirin EC 325 MG EC tablet Take 1 tablet (325 mg total) by mouth daily. 30 tablet 0  . benazepril (LOTENSIN) 10 MG tablet     . Blood Glucose Calibration (OT ULTRA/FASTTK CNTRL SOLN) SOLN     . hydrochlorothiazide (HYDRODIURIL) 25 MG tablet Take 1/2 tablet as needed for bp > 160 15 tablet 6  . insulin lispro (HUMALOG) 100 UNIT/ML cartridge Inject 80 Units into the skin daily.    Marland Kitchen levothyroxine (SYNTHROID, LEVOTHROID) 150 MCG tablet     . metoprolol succinate (TOPROL-XL) 25 MG 24 hr tablet Take 1 tablet (25 mg total) by mouth daily. 30 tablet 0  . omeprazole (PRILOSEC) 40 MG capsule Take 40 mg by mouth daily.  2  . pravastatin (PRAVACHOL) 40 MG tablet     . spironolactone-hydrochlorothiazide (ALDACTAZIDE) 25-25 MG per tablet Take 1 tablet by mouth daily.     No current facility-administered medications for this visit.     No Known Allergies  Social History   Social History  . Marital status: Married    Spouse name: Andrew Oconnor  . Number of children: 0  . Years of education: 12   Occupational History  . retired     Water quality scientist   Social History Main Topics  . Smoking status: Former Smoker    Quit date: 09/09/2002  . Smokeless  tobacco: Never Used     Comment: quit smoking 2004  . Alcohol use Yes     Comment: occas.  . Drug use: No  . Sexual activity: Not on file   Other Topics Concern  . Not on file   Social History Narrative    may continue treatment of complex apnea with his current CPAP setting , good residual AHI of 3.3/   Has improved by Epworth and has only once a night  nocturia , less shortness of breath.   gel mask fits well acc. to air leak data but has left a pressure mark.    He may need an alternative mask until it's healed. I recommended a swift FX medium pillows .   DME [employer] - machine , HCS with Nicole Kindred,  RT.      Patient is married Andrew Oconnor) and lives at home with his wife and step-son.   Patient is retired.   Patient has a high school.   Patient is right-handed.   Patient drinks 1-2 cups of coffee every morning.              Review of Systems: As noted in HPI.  All other systems reviewed and are otherwise negative except as noted above.    There were no vitals taken for this visit.  GENERAL:  Well appearing SM in NAD HEENT:  PERRL, EOMI, sclera are clear. Oropharynx is clear. NECK:  No jugular venous distention, carotid upstroke brisk and symmetric, no bruits, no thyromegaly or adenopathy LUNGS:  Clear to auscultation bilaterally CHEST:  Unremarkable HEART:  RRR,  PMI not displaced or sustained,S1 and S2 within normal limits, no S3, no S4: no clicks, no rubs, no murmurs ABD:  Soft, nontender. BS +, no masses or bruits. No hepatomegaly, no splenomegaly EXT:  2 + pulses throughout, no edema, no cyanosis no clubbing SKIN:  Warm and dry.  No rashes NEURO:  Alert and oriented x 3. Cranial nerves II through XII intact. PSYCH:  Cognitively intact  Laboratory data:  Lab Results  Component Value Date   WBC 8.4 10/04/2015   HGB 15.6 10/04/2015   HCT 43.1 10/04/2015   PLT 240 10/04/2015   GLUCOSE 267 (H) 10/04/2015   ALT 38 11/24/2014   AST 25 11/24/2014   NA 135 10/04/2015   K  4.1 10/04/2015   CL 101 10/04/2015   CREATININE 0.79 10/04/2015   BUN 17 10/04/2015   CO2 24 10/04/2015   TSH 0.046 (L) 12/05/2014   INR 1.00 11/24/2014   Labs dated 05/01/16: cholesterol 181, triglycerides 233, LDL 96, HDL 38. CMET, TSH normal Dated 08/20/16: A1c 7%.    EKG today shows NSR with normal Ecg. I have personally reviewed and interpreted this study.  Echo: 11/25/14: Study Conclusions  - Left ventricle: The cavity size was normal. Systolic function was normal. The estimated ejection fraction was in the range of 55% to 60%. Wall motion was normal; there were no regional wall motion abnormalities. Left ventricular diastolic function parameters were normal. - Left atrium: The atrium was mildly dilated. - Atrial septum: No defect or patent foramen ovale was identified. - Pericardium, extracardiac: A trivial pericardial effusion was identified posterior to the heart.  Monitor 5/16: Study Highlights    NSR and sinus tachycardia  7 beat run of NSVT  Symptoms of skipped beats and tachycardia do not correlate with arrythmia.   1. Normal sinus rhythm 2. 7 beat run NSVT 3. Symptoms do not correlate with arrhythmia.     ASSESSMENT AND PLAN:    1. Chronic Diastolic CHF: patient appears to be evoulemic on physical exam.  Continue current dose of Aldactazide.  We discussed importance of sodium restriction. I think his current functional limitation and dyspnea are predominantly due to deconditioning. Recommend daily aerobic exercise.  Continue BB and ARB for BP control.  2. Palpitations: He is currently asymptomatic. Prior event monitor showed one 7 beat run NSVT.  For now, continue BB therapy with metoprolol.   3. DM type 2. Reports last A1c was 8.2%. Encourage weight loss and reduction in carbohydrate intake.  Follow up in 6 months.

## 2016-09-16 ENCOUNTER — Ambulatory Visit: Payer: Medicare Other | Admitting: Cardiology

## 2016-10-13 NOTE — Progress Notes (Signed)
10/15/2016 Andrew Oconnor   Jan 15, 1956  [id number]  Primary Physician Haywood Pao, MD Primary Cardiologist: Dr. Abbigale Mcelhaney Martinique  HPI:  [name] is seen for follow up diastolic CHF.  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 He also has a h/o HTN, HL, OSA and hypothyroidism. He is s/p cardiac cath  on 11/24/14. This demonstrated normal anatomy and normal LV function. EDP was elevated c/w diastolic dysfunction. Echo with EF 55-60%. LV diastolic function normal.   On follow up today he is doing OK. He is  sedentary. He denies any recent dyspnea or edema. No chest pain. He walks up to the corner but otherwise no physical activity.   Current Outpatient Prescriptions  Medication Sig Dispense Refill  . benazepril (LOTENSIN) 10 MG tablet Take 10 mg by mouth daily.     . Blood Glucose Calibration (OT ULTRA/FASTTK CNTRL SOLN) SOLN     . hydrochlorothiazide (HYDRODIURIL) 25 MG tablet Take 1/2 tablet as needed for bp > 160 15 tablet 6  . insulin lispro (HUMALOG) 100 UNIT/ML cartridge Inject 80 Units into the skin daily.    Marland Kitchen levothyroxine (SYNTHROID, LEVOTHROID) 150 MCG tablet Take 150 mcg by mouth daily before breakfast.     . metoprolol succinate (TOPROL-XL) 25 MG 24 hr tablet Take 1 tablet (25 mg total) by mouth daily. 30 tablet 0  . omeprazole (PRILOSEC) 40 MG capsule Take 40 mg by mouth daily.  2  . pravastatin (PRAVACHOL) 40 MG tablet Take 40 mg by mouth daily.     Marland Kitchen spironolactone-hydrochlorothiazide (ALDACTAZIDE) 25-25 MG per tablet Take 1 tablet by mouth daily.     Current Facility-Administered Medications  Medication Dose Route Frequency Provider Last Rate Last Dose  . aspirin chewable tablet 81 mg  81 mg Oral Once Illiana Losurdo M Martinique, MD        No Known Allergies  Social History   Social History  . Marital status: Married    Spouse name: [name]  . Number of children: 0  . Years of education: 12   Occupational History  . retired     Water quality scientist   Social History Main  Topics  . Smoking status: Former Smoker    Quit date: 09/09/2002  . Smokeless tobacco: Never Used     Comment: quit smoking 2004  . Alcohol use Yes     Comment: occas.  . Drug use: No  . Sexual activity: Not on file   Other Topics Concern  . Not on file   Social History Narrative    may continue treatment of complex apnea with his current CPAP setting , good residual AHI of 3.3/   Has improved by Epworth and has only once a night  nocturia , less shortness of breath.   gel mask fits well acc. to air leak data but has left a pressure mark.    He may need an alternative mask until it's healed. I recommended a swift FX medium pillows .   DME [employer] - machine , HCS with Nicole Kindred,  RT.      Patient is married [name]) and lives at home with his wife and step-son.   Patient is retired.   Patient has a high school.   Patient is right-handed.   Patient drinks 1-2 cups of coffee every morning.              Review of Systems: As noted in HPI.  All other systems reviewed and are otherwise negative  except as noted above.    Blood pressure 124/76, pulse 73, height 4\' 7"  (1.397 m), weight 153 lb (69.4 kg).  GENERAL:  Well appearing SM in NAD HEENT:  PERRL, EOMI, sclera are clear. Oropharynx is clear. NECK:  No jugular venous distention, carotid upstroke brisk and symmetric, no bruits, no thyromegaly or adenopathy LUNGS:  Clear to auscultation bilaterally CHEST:  Unremarkable HEART:  RRR,  PMI not displaced or sustained,S1 and S2 within normal limits, no S3, no S4: no clicks, no rubs, no murmurs ABD:  Soft, nontender. BS +, no masses or bruits. No hepatomegaly, no splenomegaly EXT:  2 + pulses throughout, no edema, no cyanosis no clubbing SKIN:  Warm and dry.  No rashes NEURO:  Alert and oriented x 3. Cranial nerves II through XII intact. PSYCH:  Cognitively intact   Laboratory data:  Lab Results  Component Value Date   WBC 8.4 10/04/2015   HGB 15.6 10/04/2015   HCT 43.1  10/04/2015   PLT 240 10/04/2015   GLUCOSE 267 (H) 10/04/2015   ALT 38 11/24/2014   AST 25 11/24/2014   NA 135 10/04/2015   K 4.1 10/04/2015   CL 101 10/04/2015   CREATININE 0.79 10/04/2015   BUN 17 10/04/2015   CO2 24 10/04/2015   TSH 0.046 (L) 12/05/2014   INR 1.00 11/24/2014   Labs dated 05/01/16: cholesterol 181, triglycerides 233, HDL 38, LDL 96. CMET and TSH normal Dated 08/20/16: A1c 7%   ASSESSMENT AND PLAN:    1. Chronic Diastolic CHF: patient appears to be evoulemic on physical exam.  Continue current dose of Aldactazide.  We discussed importance of sodium restriction.  Recommend increasing daily aerobic exercise.  Continue BB and ARB for BP control.  2. Palpitations: He is currently asymptomatic. Prior event monitor showed one 7 beat run NSVT.  For now, continue BB therapy with metoprolol.   3. DM type 2. Reports last A1c was 7%. Encourage weight loss and reduction in carbohydrate intake.  Follow up in 6 months.

## 2016-10-15 ENCOUNTER — Encounter: Payer: Self-pay | Admitting: Cardiology

## 2016-10-15 ENCOUNTER — Ambulatory Visit (INDEPENDENT_AMBULATORY_CARE_PROVIDER_SITE_OTHER): Payer: Medicare Other | Admitting: Cardiology

## 2016-10-15 VITALS — BP 124/76 | HR 73 | Ht <= 58 in | Wt 153.0 lb

## 2016-10-15 DIAGNOSIS — I5032 Chronic diastolic (congestive) heart failure: Secondary | ICD-10-CM | POA: Diagnosis not present

## 2016-10-15 DIAGNOSIS — I10 Essential (primary) hypertension: Secondary | ICD-10-CM | POA: Diagnosis not present

## 2016-10-15 DIAGNOSIS — E7800 Pure hypercholesterolemia, unspecified: Secondary | ICD-10-CM | POA: Diagnosis not present

## 2016-10-15 MED ORDER — ASPIRIN 81 MG PO CHEW: 81.0000 mg | CHEWABLE_TABLET | Freq: Once | ORAL | Status: DC

## 2016-10-15 NOTE — Patient Instructions (Signed)
Reduce ASA to 81 mg daily  Try and push your exercise more  I will see you in 6 months.

## 2016-10-24 ENCOUNTER — Telehealth: Payer: Self-pay

## 2016-10-24 NOTE — Telephone Encounter (Signed)
Received a request from [place] with [facility] requesting copy of lab wok.Copy of 10/04/15 lab work faxed to her at fax # [phone].

## 2016-11-13 ENCOUNTER — Telehealth: Payer: Self-pay

## 2016-11-13 NOTE — Telephone Encounter (Signed)
Received 2nd request from [place] with [facility] requesting patient's last office note and lab.Dr.Jordan's 10/15/16 office note and labs from 10/04/15 faxed to her at fax # [phone].

## 2016-11-30 IMAGING — CR DG CHEST 1V PORT
1 series · 1 of 1 positions shown · non-contrast
Comparison: 08/19/2008

CLINICAL DATA: Chest pain and dyspnea, 1 day duration

EXAM:
PORTABLE CHEST - 1 VIEW

[AP]
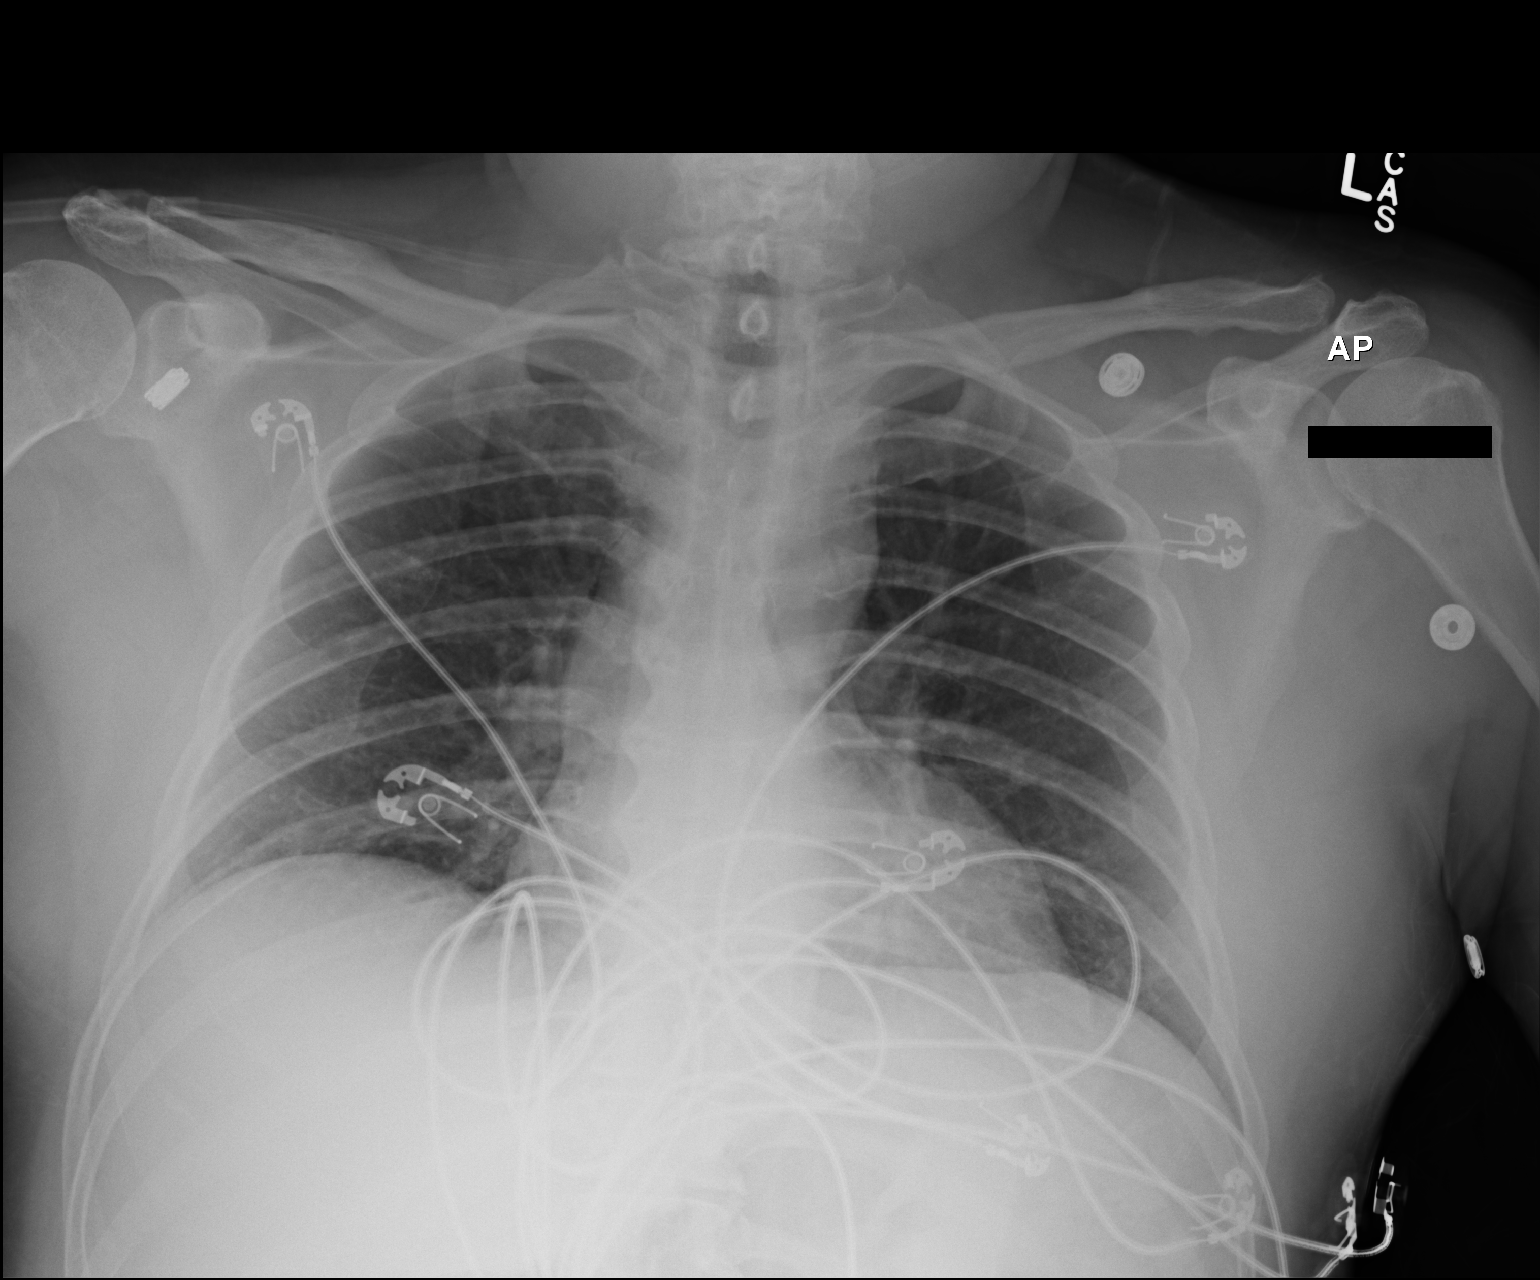

[1 of 1 positions shown; findings below may reference images not displayed]

FINDINGS: A single AP portable view of the chest demonstrates no focal
airspace consolidation or alveolar edema. The lungs are grossly
clear. There is no large effusion or pneumothorax. Cardiac and
mediastinal contours appear unremarkable.
IMPRESSION: No active disease.

## 2017-07-04 ENCOUNTER — Encounter: Payer: Self-pay | Admitting: Cardiology

## 2017-07-04 ENCOUNTER — Ambulatory Visit (INDEPENDENT_AMBULATORY_CARE_PROVIDER_SITE_OTHER): Payer: Medicare Other | Admitting: Cardiology

## 2017-07-04 VITALS — BP 106/54 | HR 74 | Ht <= 58 in | Wt 147.0 lb

## 2017-07-04 DIAGNOSIS — I10 Essential (primary) hypertension: Secondary | ICD-10-CM | POA: Diagnosis not present

## 2017-07-04 DIAGNOSIS — E7800 Pure hypercholesterolemia, unspecified: Secondary | ICD-10-CM | POA: Diagnosis not present

## 2017-07-04 DIAGNOSIS — I5032 Chronic diastolic (congestive) heart failure: Secondary | ICD-10-CM | POA: Diagnosis not present

## 2017-07-04 NOTE — Patient Instructions (Addendum)
Continue your current therapy  I will see you in one year   

## 2017-07-04 NOTE — Progress Notes (Signed)
07/04/2017 Andrew Oconnor   31-Aug-1956  [id number]  Primary Physician Tisovec, Fransico Him, MD Primary Cardiologist: Dr. Peter Martinique  HPI:  Andrew Oconnor is seen for follow up diastolic CHF.  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 He also has a h/o HTN, HL, OSA and hypothyroidism. He is s/p cardiac cath  on 11/24/14. This demonstrated normal anatomy and normal LV function. EDP was elevated c/w diastolic dysfunction. Echo with EF 55-60%. LV diastolic function normal.   On follow up today he is doing well. He reports he was much more active this summer.  He denies any dyspnea or edema. No chest pain. Denies any palpitations.    Current Outpatient Prescriptions  Medication Sig Dispense Refill  . benazepril (LOTENSIN) 10 MG tablet Take 10 mg by mouth daily.     . Blood Glucose Calibration (OT ULTRA/FASTTK CNTRL SOLN) SOLN     . hydrochlorothiazide (HYDRODIURIL) 25 MG tablet Take 1/2 tablet as needed for bp > 160 15 tablet 6  . insulin lispro (HUMALOG) 100 UNIT/ML cartridge Inject 80 Units into the skin daily.    Marland Kitchen levothyroxine (SYNTHROID, LEVOTHROID) 150 MCG tablet Take 150 mcg by mouth daily before breakfast.     . metoprolol succinate (TOPROL-XL) 25 MG 24 hr tablet Take 1 tablet (25 mg total) by mouth daily. 30 tablet 0  . omeprazole (PRILOSEC) 40 MG capsule Take 40 mg by mouth daily.  2  . pravastatin (PRAVACHOL) 40 MG tablet Take 40 mg by mouth daily.     Marland Kitchen spironolactone-hydrochlorothiazide (ALDACTAZIDE) 25-25 MG per tablet Take 1 tablet by mouth daily.     Current Facility-Administered Medications  Medication Dose Route Frequency Provider Last Rate Last Dose  . aspirin chewable tablet 81 mg  81 mg Oral Once Martinique, Peter M, MD        No Known Allergies  Social History   Social History  . Marital status: Married    Spouse name: [name]  . Number of children: 0  . Years of education: 12   Occupational History  . retired     Water quality scientist   Social History Main Topics   . Smoking status: Former Smoker    Quit date: 09/09/2002  . Smokeless tobacco: Never Used     Comment: quit smoking 2004  . Alcohol use Yes     Comment: occas.  . Drug use: No  . Sexual activity: Not on file   Other Topics Concern  . Not on file   Social History Narrative    may continue treatment of complex apnea with his current CPAP setting , good residual AHI of 3.3/   Has improved by Epworth and has only once a night  nocturia , less shortness of breath.   gel mask fits well acc. to air leak data but has left a pressure mark.    He may need an alternative mask until it's healed. I recommended a swift FX medium pillows .   DME [employer] - machine , HCS with Nicole Kindred,  RT.      Patient is married [name]) and lives at home with his wife and step-son.   Patient is retired.   Patient has a high school.   Patient is right-handed.   Patient drinks 1-2 cups of coffee every morning.              Review of Systems: As noted in HPI.  All other systems reviewed and are otherwise negative except as  noted above.    Blood pressure (!) 106/54, pulse 74, height 4\' 7"  (1.397 m), weight 147 lb (66.7 kg).  GENERAL:  Well appearing WM in NAD HEENT:  PERRL, EOMI, sclera are clear. Oropharynx is clear. NECK:  No jugular venous distention, carotid upstroke brisk and symmetric, no bruits, no thyromegaly or adenopathy LUNGS:  Clear to auscultation bilaterally CHEST:  Unremarkable HEART:  RRR,  PMI not displaced or sustained,S1 and S2 within normal limits, no S3, no S4: no clicks, no rubs, no murmurs ABD:  Soft, nontender. BS +, no masses or bruits. No hepatomegaly, no splenomegaly EXT:  2 + pulses throughout, no edema, no cyanosis no clubbing SKIN:  Warm and dry.  No rashes NEURO:  Alert and oriented x 3. Cranial nerves II through XII intact. PSYCH:  Cognitively intact     Laboratory data:  Lab Results  Component Value Date   WBC 8.4 10/04/2015   HGB 15.6 10/04/2015   HCT 43.1  10/04/2015   PLT 240 10/04/2015   GLUCOSE 267 (H) 10/04/2015   ALT 38 11/24/2014   AST 25 11/24/2014   NA 135 10/04/2015   K 4.1 10/04/2015   CL 101 10/04/2015   CREATININE 0.79 10/04/2015   BUN 17 10/04/2015   CO2 24 10/04/2015   TSH 0.046 (L) 12/05/2014   INR 1.00 11/24/2014   Labs dated 05/01/16: cholesterol 181, triglycerides 233, HDL 38, LDL 96. CMET and TSH normal Dated 08/20/16: A1c 7%   ASSESSMENT AND PLAN:    1. Chronic Diastolic CHF: patient appears to be evoulemic on physical exam.  Continue current medications.   Continue sodium restriction.  Stay active.  2. Palpitations: He is asymptomatic. Prior event monitor showed one 7 beat run NSVT.  On beta blocker.  3. DM type 2. Reports last A1c was 7%. Encourage weight loss and reduction in carbohydrate intake.  Follow up in one year

## 2018-07-03 ENCOUNTER — Telehealth: Payer: Self-pay | Admitting: Cardiology

## 2018-07-03 DIAGNOSIS — I10 Essential (primary) hypertension: Secondary | ICD-10-CM

## 2018-07-03 MED ORDER — SPIRONOLACTONE-HCTZ 25-25 MG PO TABS: 1.0000 | ORAL_TABLET | Freq: Every day | ORAL | 1 refills | Status: DC

## 2018-07-03 NOTE — Telephone Encounter (Signed)
New message:        *STAT* If patient is at the pharmacy, call can be transferred to refill team.   1. Which medications need to be refilled? (please list name of each medication and dose if known) spironolactone-hydrochlorothiazide (ALDACTAZIDE) 25-25 MG per tablet  2. Which pharmacy/location (including street and city if local pharmacy) is medication to be sent to?[id number] [place], [place] [address] [place]  3. Do they need a 30 day or 90 day supply? [place]

## 2018-07-06 ENCOUNTER — Encounter: Payer: Self-pay | Admitting: Physician Assistant

## 2018-07-06 ENCOUNTER — Ambulatory Visit: Payer: Medicare Other | Admitting: Physician Assistant

## 2018-07-06 VITALS — BP 112/60 | HR 65 | Ht <= 58 in | Wt 148.8 lb

## 2018-07-06 DIAGNOSIS — R002 Palpitations: Secondary | ICD-10-CM | POA: Diagnosis not present

## 2018-07-06 DIAGNOSIS — E119 Type 2 diabetes mellitus without complications: Secondary | ICD-10-CM

## 2018-07-06 DIAGNOSIS — R0789 Other chest pain: Secondary | ICD-10-CM

## 2018-07-06 DIAGNOSIS — E785 Hyperlipidemia, unspecified: Secondary | ICD-10-CM | POA: Diagnosis not present

## 2018-07-06 DIAGNOSIS — E039 Hypothyroidism, unspecified: Secondary | ICD-10-CM

## 2018-07-06 DIAGNOSIS — I5032 Chronic diastolic (congestive) heart failure: Secondary | ICD-10-CM

## 2018-07-06 DIAGNOSIS — Z794 Long term (current) use of insulin: Secondary | ICD-10-CM

## 2018-07-06 DIAGNOSIS — I10 Essential (primary) hypertension: Secondary | ICD-10-CM | POA: Diagnosis not present

## 2018-07-06 MED ORDER — BENAZEPRIL HCL 5 MG PO TABS: 5.0000 mg | ORAL_TABLET | Freq: Every day | ORAL | 1 refills | Status: DC

## 2018-07-06 MED ORDER — METOPROLOL SUCCINATE ER 25 MG PO TB24: 25.0000 mg | ORAL_TABLET | Freq: Two times a day (BID) | ORAL | 1 refills | Status: DC

## 2018-07-06 NOTE — Progress Notes (Signed)
Cardiology Office Note    Date:  07/06/2018   ID:  Andrew Oconnor, DOB November 29, 1955, MRN [id number]  PCP:  Haywood Pao, MD  Cardiologist:  Dr. Martinique  Chief Complaint  Patient presents with  . Follow-up    seen for Dr. Martinique.     History of Present Illness:  Andrew Oconnor is a 62 y.o. 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  He had a cardiac catheterization on 11/24/2014 which demonstrated normal anatomy, normal LV function, normal EDP. Echocardiogram showed EF 55 to 37%, normal diastolic function.  Heart monitor obtained in May 2016 showed 7 beats run of NSVT.  This is controlled on beta-blocker.  Patient presents today for cardiology office evaluation.  For the past month, he has been noticing occasional tightness in the chest.  This occurred both at rest and with exertion.  There is no obvious exacerbating factors.  Given his normal coronary arteries seen on the cardiac catheterization in 2016, I recommended plain old treadmill test.  He also has occasional palpitations.  I recommend decrease benazepril to 5 mg daily while increase Toprol-XL to 25 mg twice daily.  He did gain weight recently, however I do not see any significant lower extremity edema or JVD.  His lung is clear on physical exam.  He appears to be euvolemic.   Past Medical History:  Diagnosis Date  . Anxiety   . Carpal tunnel syndrome   . CHF (congestive heart failure) ([place])   . Colon polyp   . Depression   . Diabetes mellitus without complication ([place])   . Erectile dysfunction   . GERD (gastroesophageal reflux disease)   . Hiatal hernia   . History of diplopia     third nerve palsy 2013   . Hyperlipidemia   . Hypertension   . Obesity   . Sleep apnea with use of continuous positive airway pressure (CPAP)    diagnsed AHi of 20 in 2010 , titrated to 13 cm H20.  . Thyroid disorder     Past Surgical History:  Procedure Laterality Date  . CATARACT  EXTRACTION W/PHACO Right 10/09/2015   Procedure: CATARACT EXTRACTION PHACO AND INTRAOCULAR LENS PLACEMENT (IOC);  Surgeon: Williams Che, MD;  Location: [facility];  Service: Ophthalmology;  Laterality: Right;  CDE:2.17  . HAND SURGERY Bilateral    secondary to nerve damage  . hemorrectomy    . LEFT HEART CATHETERIZATION WITH CORONARY ANGIOGRAM N/A 11/24/2014   Procedure: LEFT HEART CATHETERIZATION WITH CORONARY ANGIOGRAM;  Surgeon: Peter M Martinique, MD;  Location: [facility] CATH LAB;  Service: Cardiovascular;  Laterality: N/A;    Current Medications: Outpatient Medications Prior to Visit  Medication Sig Dispense Refill  . Blood Glucose Calibration (OT ULTRA/FASTTK CNTRL SOLN) SOLN     . hydrochlorothiazide (HYDRODIURIL) 25 MG tablet Take 1/2 tablet as needed for bp > 160 15 tablet 6  . insulin lispro (HUMALOG) 100 UNIT/ML cartridge Inject 80 Units into the skin daily.    Marland Kitchen levothyroxine (SYNTHROID, LEVOTHROID) 150 MCG tablet Take 150 mcg by mouth daily before breakfast.     . omeprazole (PRILOSEC) 40 MG capsule Take 40 mg by mouth daily.  2  . pravastatin (PRAVACHOL) 40 MG tablet Take 40 mg by mouth daily.     Marland Kitchen spironolactone-hydrochlorothiazide (ALDACTAZIDE) 25-25 MG tablet Take 1 tablet by mouth daily. KEEP OV. 30 tablet 1  . benazepril (LOTENSIN) 10 MG tablet Take 10 mg by mouth  daily.     . metoprolol succinate (TOPROL-XL) 25 MG 24 hr tablet Take 1 tablet (25 mg total) by mouth daily. 30 tablet 0   Facility-Administered Medications Prior to Visit  Medication Dose Route Frequency Provider Last Rate Last Dose  . aspirin chewable tablet 81 mg  81 mg Oral Once Martinique, Peter M, MD         Allergies:   Patient has no known allergies.   Social History   Socioeconomic History  . Marital status: Married    Spouse name: [name]  . Number of children: 0  . Years of education: 9  . Highest education level: Not on file  Occupational History  . Occupation: retired    Comment: Special educational needs teacher  Social Needs  . Financial resource strain: Not on file  . Food insecurity:    Worry: Not on file    Inability: Not on file  . Transportation needs:    Medical: Not on file    Non-medical: Not on file  Tobacco Use  . Smoking status: Former Smoker    Last attempt to quit: 09/09/2002    Years since quitting: 15.8  . Smokeless tobacco: Never Used  . Tobacco comment: quit smoking 2004  Substance and Sexual Activity  . Alcohol use: Yes    Comment: occas.  . Drug use: No  . Sexual activity: Not on file  Lifestyle  . Physical activity:    Days per week: Not on file    Minutes per session: Not on file  . Stress: Not on file  Relationships  . Social connections:    Talks on phone: Not on file    Gets together: Not on file    Attends religious service: Not on file    Active member of club or organization: Not on file    Attends meetings of clubs or organizations: Not on file    Relationship status: Not on file  Other Topics Concern  . Not on file  Social History Narrative    may continue treatment of complex apnea with his current CPAP setting , good residual AHI of 3.3/   Has improved by [facility] and has only once a night  nocturia , less shortness of breath.   gel mask fits well acc. to air leak data but has left a pressure mark.    He may need an alternative mask until it's healed. I recommended a swift FX medium pillows .   DME [employer] - machine , HCS with Nicole Kindred,  RT.      Patient is married [name]) and lives at home with his wife and step-son.   Patient is retired.   Patient has a high school.   Patient is right-handed.   Patient drinks 1-2 cups of coffee every morning.              Family History:  The patient's family history includes Cancer in his sister; Diabetes in his brother and father; Heart disease in his brother, father, maternal grandfather, maternal grandmother, mother, paternal grandfather, paternal grandmother, and sister; Seizures in his father;  Stroke in his father and other.   ROS:   Please see the history of present illness.    ROS All other systems reviewed and are negative.   PHYSICAL EXAM:   VS:  BP 112/60   Pulse 65   Ht 4\' 7"  (1.397 m)   Wt 148 lb 12.8 oz (67.5 kg)   BMI 34.58 kg/m  GEN: Well nourished, well developed, in no acute distress  HEENT: normal  Neck: no JVD, carotid bruits, or masses Cardiac: RRR; no murmurs, rubs, or gallops,no edema  Respiratory:  clear to auscultation bilaterally, normal work of breathing GI: soft, nontender, nondistended, + BS MS: no deformity or atrophy  Skin: warm and dry, no rash Neuro:  Alert and Oriented x 3, Strength and sensation are intact Psych: euthymic mood, full affect  Wt Readings from Last 3 Encounters:  07/06/18 148 lb 12.8 oz (67.5 kg)  07/04/17 147 lb (66.7 kg)  10/15/16 153 lb (69.4 kg)      Studies/Labs Reviewed:   EKG:  EKG is ordered today.  The ekg ordered today demonstrates normal sinus rhythm, heart rate 65.  No obvious ST-T wave changes.  Recent Labs: No results found for requested labs within last 8760 hours.   Lipid Panel No results found for: CHOL, TRIG, HDL, CHOLHDL, VLDL, LDLCALC, LDLDIRECT  Additional studies/ records that were reviewed today include:   Cath 11/23/2016 Procedural Findings: Hemodynamics: AO 156/78 mean 114 mm Hg LV 166/32 mm Hg  Coronary angiography: Coronary dominance: left  Left mainstem:   Left anterior descending (LAD): Normal   Left circumflex (LCx): Normal  Right coronary artery (RCA): Normal  Left ventriculography: Left ventricular systolic function is normal, LVEF is estimated at 55-65%, there is no significant mitral regurgitation. The proximal aorta is mildly dilated.   Final Conclusions:   1. Normal coronary anatomy. 2. Normal LV function 3. Elevated LV EDP   Echo 11/25/2014 LV EF: 55% -  60% Study Conclusions  - Left ventricle: The cavity size was normal. Systolic function  was normal. The estimated ejection fraction was in the range of 55% to 60%. Wall motion was normal; there were no regional wall motion abnormalities. Left ventricular diastolic function parameters were normal. - Left atrium: The atrium was mildly dilated. - Atrial septum: No defect or patent foramen ovale was identified. - Pericardium, extracardiac: A trivial pericardial effusion was identified posterior to the heart.   Event monitor 01/18/2015 Study Highlights    NSR and sinus tachycardia  7 beat run of NSVT  Symptoms of skipped beats and tachycardia do not correlate with arrythmia.   1. Normal sinus rhythm 2. 7 beat run NSVT 3. Symptoms do not correlate with arrhythmia.    ASSESSMENT:    1. Atypical chest pain   2. Palpitation   3. Controlled type 2 diabetes mellitus without complication, with long-term current use of insulin ([place])   4. Essential hypertension   5. Hyperlipidemia, unspecified hyperlipidemia type   6. Hypothyroidism, unspecified type   7. Chronic diastolic heart failure (HCC)      PLAN:  In order of problems listed above:  1. Atypical chest pain: Previous cardiac catheterization 2016 showed normal coronary arteries.  His recent chest pain is not associated with exertion.  I recommend plain old treadmill test.  2. Palpitation: We will attempt to control palpitation better using metoprolol.  Decrease benazepril while increase Toprol-XL to 25 mg twice daily.  3. Hypertension: Given recent palpitation, increased Toprol-XL to 25 mg twice daily.  Decrease benazepril to 5 mg daily  4. Hyperlipidemia: On pravastatin 40 mg daily.  Recent lab work showed well-controlled total cholesterol, HDL and LDL, borderline elevated hypertriglyceridemia with triglyceride 180.  Recommend diet and exercise, may also consider over-the-counter fish oil as well  5. Hypothyroidism: Managed by primary care provider  6. Chronic diastolic heart failure: Euvolemic on  physical exam.  Medication Adjustments/Labs and Tests Ordered: Current medicines are reviewed at length with the patient today.  Concerns regarding medicines are outlined above.  Medication changes, Labs and Tests ordered today are listed in the Patient Instructions below. Patient Instructions  Medication Instructions:  Decrease Benazepril to 5 mg daily.  Increase Metoprolol succinate to 25 mg twice daily.  If you need a refill on your cardiac medications before your next appointment, please call your pharmacy.   Lab work: None   Testing/Procedures: Your physician has requested that you have an exercise tolerance test. For further information please visit [email or web address]. Please also follow instruction sheet, as given.  Follow-Up: At [facility], you and your health needs are our priority.  As part of our continuing mission to provide you with exceptional heart care, we have created designated Provider Care Teams.  These Care Teams include your primary Cardiologist (physician) and Advanced Practice Providers (APPs -  Physician Assistants and Nurse Practitioners) who all work together to provide you with the care you need, when you need it. You will need a follow up appointment in 6 months with Dr. Martinique. Please call our office 2 months ahead of time to schedule your appointment.   Advanced Practice Providers on your designated Care Team: [place], [place] . Fabian Sharp, PA-C  Any Other Special Instructions Will Be Listed Below (If Applicable). none      Hilbert Corrigan, [place]  07/06/2018 5:28 PM    [facility] [address], [place] [address], [place]  [address] Phone: [phone]; Fax: [phone]

## 2018-07-06 NOTE — Patient Instructions (Signed)
Medication Instructions:  Decrease Benazepril to 5 mg daily.  Increase Metoprolol succinate to 25 mg twice daily.  If you need a refill on your cardiac medications before your next appointment, please call your pharmacy.   Lab work: None   Testing/Procedures: Your physician has requested that you have an exercise tolerance test. For further information please visit [email or web address]. Please also follow instruction sheet, as given.  Follow-Up: At [facility], you and your health needs are our priority.  As part of our continuing mission to provide you with exceptional heart care, we have created designated Provider Care Teams.  These Care Teams include your primary Cardiologist (physician) and Advanced Practice Providers (APPs -  Physician Assistants and Nurse Practitioners) who all work together to provide you with the care you need, when you need it. You will need a follow up appointment in 6 months with Dr. Martinique. Please call our office 2 months ahead of time to schedule your appointment.   Advanced Practice Providers on your designated Care Team: [place], [place] . Fabian Sharp, PA-C  Any Other Special Instructions Will Be Listed Below (If Applicable). none

## 2018-07-07 ENCOUNTER — Telehealth: Payer: Self-pay

## 2018-07-07 NOTE — Telephone Encounter (Signed)
-----   Message from [place], [place] sent at 07/06/2018  5:32 PM EDT ----- Regarding: call patient Please remind patient to hold metoprolol for 24 hours prior to his upcoming treadmill stress test. This will allow his HR to rise with exercise.   Hilbert Corrigan PA Pager: [phone]

## 2018-07-07 NOTE — Telephone Encounter (Signed)
Left detailed message with Andrew Oconnor's recommendations. Advised patient to contact office with any questions or concerns.

## 2018-07-08 ENCOUNTER — Telehealth (HOSPITAL_COMMUNITY): Payer: Self-pay

## 2018-07-08 NOTE — Addendum Note (Signed)
Addended by: Alvina Filbert B on: 07/08/2018 04:25 PM   Modules accepted: Orders

## 2018-07-08 NOTE — Telephone Encounter (Signed)
Encounter complete. 

## 2018-07-14 ENCOUNTER — Ambulatory Visit (HOSPITAL_COMMUNITY)
Admission: RE | Admit: 2018-07-14 | Discharge: 2018-07-14 | Disposition: A | Discharge status: Home / Self Care | Payer: Medicare Other | Source: Ambulatory Visit | Attending: Internal Medicine | Admitting: Internal Medicine

## 2018-07-14 DIAGNOSIS — R0789 Other chest pain: Secondary | ICD-10-CM | POA: Diagnosis present

## 2018-07-14 LAB — EXERCISE TOLERANCE TEST
Estimated workload: 7.9 METS
Exercise duration (min): 6 min
Exercise duration (sec): 35 s
MPHR: 157 {beats}/min
Peak HR: 137 {beats}/min
Percent HR: 87 %
RPE: 19
Rest HR: 60 {beats}/min

## 2018-07-16 ENCOUNTER — Other Ambulatory Visit: Payer: Self-pay | Admitting: Physician Assistant

## 2018-07-20 NOTE — Telephone Encounter (Signed)
Rx has been sent to the pharmacy electronically. ° °

## 2018-08-20 ENCOUNTER — Encounter: Payer: Self-pay | Admitting: Internal Medicine

## 2018-08-27 ENCOUNTER — Other Ambulatory Visit: Payer: Self-pay | Admitting: Cardiology

## 2018-09-18 ENCOUNTER — Encounter: Payer: Self-pay | Admitting: Internal Medicine

## 2018-09-18 ENCOUNTER — Ambulatory Visit (AMBULATORY_SURGERY_CENTER): Payer: Self-pay

## 2018-09-18 ENCOUNTER — Telehealth: Payer: Self-pay

## 2018-09-18 VITALS — Ht <= 58 in | Wt 150.0 lb

## 2018-09-18 DIAGNOSIS — Z86010 Personal history of colonic polyps: Secondary | ICD-10-CM

## 2018-09-18 MED ORDER — NA SULFATE-K SULFATE-MG SULF 17.5-3.13-1.6 GM/177ML PO SOLN
1.0000 | Freq: Once | ORAL | 0 refills | Status: AC
Start: 2018-09-18 — End: 2018-09-18

## 2018-09-18 NOTE — Telephone Encounter (Signed)
Magda Paganini,    I saw Shanna Strength in [facility] today. He is scheduled for a colonoscopy with Dr. Henrene Pastor on 10/02/18. He is on an insulin pump that is handled by Dr. Domenick Gong. Please contact Dr. Loren Racer office and find out how they would like to handle his insulin for the up coming procedure. Thanks!!   Riki Sheer, LPN ( [facility] )

## 2018-09-18 NOTE — Telephone Encounter (Signed)
Faxed letter regarding handling of patient's insulin before his procedure to Dr. Osborne Casco

## 2018-09-18 NOTE — Progress Notes (Signed)
Denies allergies to eggs or soy products. Denies complication of anesthesia or sedation. Denies use of weight loss medication. Denies use of O2.   Emmi instructions declined.   Patient is on an insulin pump. A note will be sent to Dr. Blanch Media CMA to contact Dr. Domenick Gong to get specific instructions for his insulin pump for his colonoscopy that is scheduled for 10/02/18.

## 2018-09-23 NOTE — Telephone Encounter (Signed)
Spoke with patient and gave him his insulin pump instructions from Dr. Osborne Casco -  "frequent CBG testing, decrease Basal to 50% with prep, bolus as needed.  Patient acknowledged and undertsood

## 2018-09-23 NOTE — Telephone Encounter (Signed)
Spoke to patient and gave him Dr. Loren Racer instructions for his insulin pump:  "frequent CBG testing,decrease basal to 50% with prep, bolus as needed."  Patient acknowledged and understood

## 2018-09-23 NOTE — Telephone Encounter (Signed)
Lm with [place] to call me with an answer regarding insulin pump asap.

## 2018-10-02 ENCOUNTER — Encounter: Payer: Self-pay | Admitting: Internal Medicine

## 2018-10-02 ENCOUNTER — Ambulatory Visit (AMBULATORY_SURGERY_CENTER): Payer: Medicare Other | Admitting: Internal Medicine

## 2018-10-02 VITALS — BP 113/69 | HR 98 | Temp 98.6°F | Resp 12 | Ht <= 58 in | Wt 148.0 lb

## 2018-10-02 DIAGNOSIS — Z86010 Personal history of colonic polyps: Secondary | ICD-10-CM

## 2018-10-02 MED ORDER — SODIUM CHLORIDE 0.9 % IV SOLN: 500.0000 mL | Freq: Once | INTRAVENOUS | Status: DC

## 2018-10-02 NOTE — Progress Notes (Signed)
Pt's states no medical or surgical changes since previsit or office visit. 

## 2018-10-02 NOTE — Op Note (Signed)
[place] Patient Name: Andrew Oconnor Procedure Date: 10/02/2018 7:54 AM MRN: [id number] Endoscopist: Docia Chuck. Henrene Pastor , MD Age: 63 Referring MD:  Date of Birth: 04-03-1956 Gender: Male Account #: [id number] Procedure:                Colonoscopy Indications:              High risk colon cancer surveillance: Personal                            history of multiple adenomas. Previous examinations                            2007 and 2014 Medicines:                Monitored Anesthesia Care Procedure:                Pre-Anesthesia Assessment:                           - Prior to the procedure, a History and Physical                            was performed, and patient medications and                            allergies were reviewed. The patient's tolerance of                            previous anesthesia was also reviewed. The risks                            and benefits of the procedure and the sedation                            options and risks were discussed with the patient.                            All questions were answered, and informed consent                            was obtained. Prior Anticoagulants: The patient has                            taken no previous anticoagulant or antiplatelet                            agents. ASA Grade Assessment: III - A patient with                            severe systemic disease. After reviewing the risks                            and benefits, the patient was deemed in  satisfactory condition to undergo the procedure.                           After obtaining informed consent, the colonoscope                            was passed under direct vision. Throughout the                            procedure, the patient's blood pressure, pulse, and                            oxygen saturations were monitored continuously. The                            Colonoscope was introduced through the anus and                             advanced to the the cecum, identified by                            appendiceal orifice and ileocecal valve. The                            ileocecal valve, appendiceal orifice, and rectum                            were photographed. The quality of the bowel                            preparation was excellent. The colonoscopy was                            performed without difficulty. The patient tolerated                            the procedure well. The bowel preparation used was                            SUPREP. Scope In: 8:19:44 AM Scope Out: 8:31:17 AM Scope Withdrawal Time: 0 hours 8 minutes 57 seconds  Total Procedure Duration: 0 hours 11 minutes 33 seconds  Findings:                 Multiple diverticula were found in the left colon                            and right colon.                           The exam was otherwise without abnormality on                            direct and retroflexion views. Internal hemorrhoids  present. Complications:            No immediate complications. Estimated blood loss:                            None. Estimated Blood Loss:     Estimated blood loss: none. Impression:               - Diverticulosis in the left colon and in the right                            colon.                           -The examination was otherwise normal. Incidental                            internal hemorrhoids noted. Recommendation:           - Repeat colonoscopy in 5 years for surveillance.                           - Patient has a contact number available for                            emergencies. The signs and symptoms of potential                            delayed complications were discussed with the                            patient. Return to normal activities tomorrow.                            Written discharge instructions were provided to the                            patient.                            - Resume previous diet.                           - Continue present medications. Docia Chuck. Henrene Pastor, MD 10/02/2018 8:41:22 AM This report has been signed electronically.

## 2018-10-02 NOTE — Patient Instructions (Signed)
YOU HAD AN ENDOSCOPIC PROCEDURE TODAY AT [facility]:   Refer to the procedure report that was given to you for any specific questions about what was found during the examination.  If the procedure report does not answer your questions, please call your gastroenterologist to clarify.  If you requested that your care partner not be given the details of your procedure findings, then the procedure report has been included in a sealed envelope for you to review at your convenience later.  YOU SHOULD EXPECT: Some feelings of bloating in the abdomen. Passage of more gas than usual.  Walking can help get rid of the air that was put into your GI tract during the procedure and reduce the bloating. If you had a lower endoscopy (such as a colonoscopy or flexible sigmoidoscopy) you may notice spotting of blood in your stool or on the toilet paper. If you underwent a bowel prep for your procedure, you may not have a normal bowel movement for a few days.  Please Note:  You might notice some irritation and congestion in your nose or some drainage.  This is from the oxygen used during your procedure.  There is no need for concern and it should clear up in a day or so.  SYMPTOMS TO REPORT IMMEDIATELY:   Following lower endoscopy (colonoscopy or flexible sigmoidoscopy):  Excessive amounts of blood in the stool  Significant tenderness or worsening of abdominal pains  Swelling of the abdomen that is new, acute  Fever of 100F or higher   For urgent or emergent issues, a gastroenterologist can be reached at any hour by calling [phone].   DIET:  We do recommend a small meal at first, but then you may proceed to your regular diet.  Drink plenty of fluids but you should avoid alcoholic beverages for 24 hours.  Try to increase the fiber in your diet, and drink plenty of water.  ACTIVITY:  You should plan to take it easy for the rest of today and you should NOT DRIVE or use heavy machinery until  tomorrow (because of the sedation medicines used during the test).    FOLLOW UP: Our staff will call the number listed on your records the next business day following your procedure to check on you and address any questions or concerns that you may have regarding the information given to you following your procedure. If we do not reach you, we will leave a message.  However, if you are feeling well and you are not experiencing any problems, there is no need to return our call.  We will assume that you have returned to your regular daily activities without incident.  Read all handouts given to you by your recovery room nurse.  If any biopsies were taken you will be contacted by phone or by letter within the next 1-3 weeks.  Please call us at [phone] if you have not heard about the biopsies in 3 weeks.    SIGNATURES/CONFIDENTIALITY: You and/or your care partner have signed paperwork which will be entered into your electronic medical record.  These signatures attest to the fact that that the information above on your After Visit Summary has been reviewed and is understood.  Full responsibility of the confidentiality of this discharge information lies with you and/or your care-partner.

## 2018-10-02 NOTE — Progress Notes (Signed)
Vss. Pt awake. Report given to RN. No anesthetic complications noted 

## 2018-10-05 ENCOUNTER — Telehealth: Payer: Self-pay

## 2018-10-05 NOTE — Telephone Encounter (Signed)
Left message on f/u call 

## 2018-10-05 NOTE — Telephone Encounter (Signed)
Called [phone] and left a messaged we tried to reach pt for a follow up call. maw

## 2019-01-26 ENCOUNTER — Other Ambulatory Visit: Payer: Self-pay | Admitting: Cardiology

## 2019-01-26 MED ORDER — METOPROLOL SUCCINATE ER 25 MG PO TB24: 25.0000 mg | ORAL_TABLET | Freq: Two times a day (BID) | ORAL | 1 refills | Status: DC

## 2019-01-26 MED ORDER — BENAZEPRIL HCL 5 MG PO TABS: 5.0000 mg | ORAL_TABLET | Freq: Every day | ORAL | 1 refills | Status: DC

## 2019-01-26 NOTE — Telephone Encounter (Signed)
Pt calling requesting a refill on metoprolol and benazepril sent to [facility] in [place] ph# [phone]. Please address

## 2019-07-27 ENCOUNTER — Other Ambulatory Visit: Payer: Self-pay | Admitting: Physician Assistant

## 2019-09-25 ENCOUNTER — Other Ambulatory Visit: Payer: Self-pay | Admitting: Physician Assistant

## 2019-10-30 ENCOUNTER — Other Ambulatory Visit: Payer: Self-pay | Admitting: Physician Assistant

## 2019-11-11 ENCOUNTER — Other Ambulatory Visit: Payer: Self-pay | Admitting: Cardiology

## 2019-12-19 ENCOUNTER — Other Ambulatory Visit: Payer: Self-pay | Admitting: Cardiology

## 2020-02-04 DIAGNOSIS — Z87891 Personal history of nicotine dependence: Secondary | ICD-10-CM | POA: Diagnosis not present

## 2020-02-04 DIAGNOSIS — I509 Heart failure, unspecified: Secondary | ICD-10-CM | POA: Diagnosis not present

## 2020-02-04 DIAGNOSIS — E1165 Type 2 diabetes mellitus with hyperglycemia: Secondary | ICD-10-CM | POA: Diagnosis not present

## 2020-02-04 DIAGNOSIS — K219 Gastro-esophageal reflux disease without esophagitis: Secondary | ICD-10-CM | POA: Diagnosis not present

## 2020-02-04 DIAGNOSIS — Z8249 Family history of ischemic heart disease and other diseases of the circulatory system: Secondary | ICD-10-CM | POA: Diagnosis not present

## 2020-02-04 DIAGNOSIS — Z823 Family history of stroke: Secondary | ICD-10-CM | POA: Diagnosis not present

## 2020-02-04 DIAGNOSIS — E261 Secondary hyperaldosteronism: Secondary | ICD-10-CM | POA: Diagnosis not present

## 2020-02-04 DIAGNOSIS — E1142 Type 2 diabetes mellitus with diabetic polyneuropathy: Secondary | ICD-10-CM | POA: Diagnosis not present

## 2020-02-04 DIAGNOSIS — Z6833 Body mass index (BMI) 33.0-33.9, adult: Secondary | ICD-10-CM | POA: Diagnosis not present

## 2020-02-04 DIAGNOSIS — F325 Major depressive disorder, single episode, in full remission: Secondary | ICD-10-CM | POA: Diagnosis not present

## 2020-02-04 DIAGNOSIS — Z809 Family history of malignant neoplasm, unspecified: Secondary | ICD-10-CM | POA: Diagnosis not present

## 2020-02-04 DIAGNOSIS — Z833 Family history of diabetes mellitus: Secondary | ICD-10-CM | POA: Diagnosis not present

## 2020-02-04 DIAGNOSIS — E1159 Type 2 diabetes mellitus with other circulatory complications: Secondary | ICD-10-CM | POA: Diagnosis not present

## 2020-02-04 DIAGNOSIS — Z7982 Long term (current) use of aspirin: Secondary | ICD-10-CM | POA: Diagnosis not present

## 2020-02-04 DIAGNOSIS — E039 Hypothyroidism, unspecified: Secondary | ICD-10-CM | POA: Diagnosis not present

## 2020-02-04 DIAGNOSIS — I110 Hypertensive heart disease with heart failure: Secondary | ICD-10-CM | POA: Diagnosis not present

## 2020-02-04 DIAGNOSIS — N529 Male erectile dysfunction, unspecified: Secondary | ICD-10-CM | POA: Diagnosis not present

## 2020-02-04 DIAGNOSIS — I25119 Atherosclerotic heart disease of native coronary artery with unspecified angina pectoris: Secondary | ICD-10-CM | POA: Diagnosis not present

## 2020-02-08 ENCOUNTER — Other Ambulatory Visit: Payer: Self-pay | Admitting: Cardiology

## 2020-03-06 DIAGNOSIS — I209 Angina pectoris, unspecified: Secondary | ICD-10-CM | POA: Diagnosis not present

## 2020-03-06 DIAGNOSIS — I110 Hypertensive heart disease with heart failure: Secondary | ICD-10-CM | POA: Diagnosis not present

## 2020-03-06 DIAGNOSIS — G589 Mononeuropathy, unspecified: Secondary | ICD-10-CM | POA: Diagnosis not present

## 2020-03-06 DIAGNOSIS — Z794 Long term (current) use of insulin: Secondary | ICD-10-CM | POA: Diagnosis not present

## 2020-03-06 DIAGNOSIS — I5032 Chronic diastolic (congestive) heart failure: Secondary | ICD-10-CM | POA: Diagnosis not present

## 2020-03-06 DIAGNOSIS — E038 Other specified hypothyroidism: Secondary | ICD-10-CM | POA: Diagnosis not present

## 2020-03-06 DIAGNOSIS — E10319 Type 1 diabetes mellitus with unspecified diabetic retinopathy without macular edema: Secondary | ICD-10-CM | POA: Diagnosis not present

## 2020-03-06 DIAGNOSIS — E7800 Pure hypercholesterolemia, unspecified: Secondary | ICD-10-CM | POA: Diagnosis not present

## 2020-03-06 DIAGNOSIS — E1040 Type 1 diabetes mellitus with diabetic neuropathy, unspecified: Secondary | ICD-10-CM | POA: Diagnosis not present

## 2020-03-27 ENCOUNTER — Other Ambulatory Visit: Payer: Self-pay | Admitting: Cardiology

## 2020-04-07 DIAGNOSIS — E1040 Type 1 diabetes mellitus with diabetic neuropathy, unspecified: Secondary | ICD-10-CM | POA: Diagnosis not present

## 2020-05-26 DIAGNOSIS — Z125 Encounter for screening for malignant neoplasm of prostate: Secondary | ICD-10-CM | POA: Diagnosis not present

## 2020-05-26 DIAGNOSIS — E7800 Pure hypercholesterolemia, unspecified: Secondary | ICD-10-CM | POA: Diagnosis not present

## 2020-05-26 DIAGNOSIS — E1065 Type 1 diabetes mellitus with hyperglycemia: Secondary | ICD-10-CM | POA: Diagnosis not present

## 2020-05-26 DIAGNOSIS — E039 Hypothyroidism, unspecified: Secondary | ICD-10-CM | POA: Diagnosis not present

## 2020-06-02 DIAGNOSIS — Z23 Encounter for immunization: Secondary | ICD-10-CM | POA: Diagnosis not present

## 2020-06-02 DIAGNOSIS — I209 Angina pectoris, unspecified: Secondary | ICD-10-CM | POA: Diagnosis not present

## 2020-06-02 DIAGNOSIS — Z0000 Encounter for general adult medical examination without abnormal findings: Secondary | ICD-10-CM | POA: Diagnosis not present

## 2020-06-02 DIAGNOSIS — E10319 Type 1 diabetes mellitus with unspecified diabetic retinopathy without macular edema: Secondary | ICD-10-CM | POA: Diagnosis not present

## 2020-06-02 DIAGNOSIS — I110 Hypertensive heart disease with heart failure: Secondary | ICD-10-CM | POA: Diagnosis not present

## 2020-06-02 DIAGNOSIS — E1065 Type 1 diabetes mellitus with hyperglycemia: Secondary | ICD-10-CM | POA: Diagnosis not present

## 2020-06-02 DIAGNOSIS — E10649 Type 1 diabetes mellitus with hypoglycemia without coma: Secondary | ICD-10-CM | POA: Diagnosis not present

## 2020-06-02 DIAGNOSIS — Z794 Long term (current) use of insulin: Secondary | ICD-10-CM | POA: Diagnosis not present

## 2020-06-02 DIAGNOSIS — Z4681 Encounter for fitting and adjustment of insulin pump: Secondary | ICD-10-CM | POA: Diagnosis not present

## 2020-06-29 DIAGNOSIS — E1040 Type 1 diabetes mellitus with diabetic neuropathy, unspecified: Secondary | ICD-10-CM | POA: Diagnosis not present

## 2020-08-09 DIAGNOSIS — G4733 Obstructive sleep apnea (adult) (pediatric): Secondary | ICD-10-CM | POA: Diagnosis not present

## 2020-08-25 ENCOUNTER — Telehealth: Payer: Self-pay | Admitting: Cardiology

## 2020-08-25 MED ORDER — METOPROLOL SUCCINATE ER 25 MG PO TB24: 25.0000 mg | ORAL_TABLET | Freq: Two times a day (BID) | ORAL | 0 refills | Status: DC

## 2020-08-25 NOTE — Telephone Encounter (Signed)
*  STAT* If patient is at the pharmacy, call can be transferred to refill team.   1. Which medications need to be refilled? (please list name of each medication and dose if known)  metoprolol succinate (TOPROL-XL) 25 MG 24 hr tablet 25 mg twice daily   2. Which pharmacy/location (including street and city if local pharmacy) is medication to be sent to? [place], [address]  3. Do they need a 30 day or 90 day supply? 90   Patient is scheduled to see Almyra Deforest 09/25/20 but is out of medication

## 2020-08-25 NOTE — Telephone Encounter (Signed)
Metoprolol refill sent to pharmacy.Advised to keep appointment with Almyra Deforest PA /17/22.

## 2020-08-29 DIAGNOSIS — M7712 Lateral epicondylitis, left elbow: Secondary | ICD-10-CM | POA: Diagnosis not present

## 2020-08-29 DIAGNOSIS — M7711 Lateral epicondylitis, right elbow: Secondary | ICD-10-CM | POA: Diagnosis not present

## 2020-09-17 ENCOUNTER — Other Ambulatory Visit: Payer: Self-pay | Admitting: Cardiology

## 2020-09-19 DIAGNOSIS — D692 Other nonthrombocytopenic purpura: Secondary | ICD-10-CM | POA: Diagnosis not present

## 2020-09-19 DIAGNOSIS — E7800 Pure hypercholesterolemia, unspecified: Secondary | ICD-10-CM | POA: Diagnosis not present

## 2020-09-19 DIAGNOSIS — Z794 Long term (current) use of insulin: Secondary | ICD-10-CM | POA: Diagnosis not present

## 2020-09-19 DIAGNOSIS — I209 Angina pectoris, unspecified: Secondary | ICD-10-CM | POA: Diagnosis not present

## 2020-09-19 DIAGNOSIS — I5032 Chronic diastolic (congestive) heart failure: Secondary | ICD-10-CM | POA: Diagnosis not present

## 2020-09-19 DIAGNOSIS — E10319 Type 1 diabetes mellitus with unspecified diabetic retinopathy without macular edema: Secondary | ICD-10-CM | POA: Diagnosis not present

## 2020-09-19 DIAGNOSIS — Z4681 Encounter for fitting and adjustment of insulin pump: Secondary | ICD-10-CM | POA: Diagnosis not present

## 2020-09-19 DIAGNOSIS — E039 Hypothyroidism, unspecified: Secondary | ICD-10-CM | POA: Diagnosis not present

## 2020-09-19 DIAGNOSIS — I110 Hypertensive heart disease with heart failure: Secondary | ICD-10-CM | POA: Diagnosis not present

## 2020-09-20 ENCOUNTER — Telehealth: Payer: Self-pay | Admitting: Cardiology

## 2020-09-20 NOTE — Telephone Encounter (Signed)
Patient would like appt on 09/25/20 with Almyra Deforest to be virtual, is this okay?

## 2020-09-20 NOTE — Telephone Encounter (Signed)
Spoke with patient, per Ellison Carwin she will need to speak with Almyra Deforest, PA to determine if appointment can be changed to virtual. She will reach out to patient after confirming with provider. Patient verbalized understanding.

## 2020-09-20 NOTE — Telephone Encounter (Signed)
Returned call and spoke with Mr. Andrew Oconnor. Patient stated that he would like to change his upcoming appointment to a virtual visit due to inclement weather expected to occur in the area. I informed patient that I did speak with Almyra Deforest, PA-C and that he is okay with patient changing appointment to virtual visit. Patient was informed that someone will be giving him a call 15-20 mins before scheduled appointment time to obtain his vital signs and review medications. I also let patient know that for video visit the steps will be reviewed with him at the time of work up. Patient verbalized understanding and thanked me for returning his call. All questions (if any) were answered.

## 2020-09-25 ENCOUNTER — Telehealth: Payer: Self-pay

## 2020-09-25 ENCOUNTER — Telehealth (INDEPENDENT_AMBULATORY_CARE_PROVIDER_SITE_OTHER): Payer: Medicare PPO | Admitting: Physician Assistant

## 2020-09-25 ENCOUNTER — Encounter: Payer: Self-pay | Admitting: Physician Assistant

## 2020-09-25 VITALS — BP 134/60 | HR 62 | Ht <= 58 in | Wt 147.4 lb

## 2020-09-25 DIAGNOSIS — I472 Ventricular tachycardia: Secondary | ICD-10-CM | POA: Diagnosis not present

## 2020-09-25 DIAGNOSIS — I10 Essential (primary) hypertension: Secondary | ICD-10-CM | POA: Diagnosis not present

## 2020-09-25 DIAGNOSIS — E119 Type 2 diabetes mellitus without complications: Secondary | ICD-10-CM

## 2020-09-25 DIAGNOSIS — Z794 Long term (current) use of insulin: Secondary | ICD-10-CM | POA: Diagnosis not present

## 2020-09-25 DIAGNOSIS — E785 Hyperlipidemia, unspecified: Secondary | ICD-10-CM | POA: Diagnosis not present

## 2020-09-25 DIAGNOSIS — E039 Hypothyroidism, unspecified: Secondary | ICD-10-CM | POA: Diagnosis not present

## 2020-09-25 DIAGNOSIS — I4729 Other ventricular tachycardia: Secondary | ICD-10-CM

## 2020-09-25 MED ORDER — BENAZEPRIL HCL 10 MG PO TABS: 10.0000 mg | ORAL_TABLET | Freq: Every day | ORAL | 3 refills | Status: DC

## 2020-09-25 MED ORDER — METOPROLOL SUCCINATE ER 25 MG PO TB24: 25.0000 mg | ORAL_TABLET | Freq: Two times a day (BID) | ORAL | 0 refills | Status: DC

## 2020-09-25 NOTE — Patient Instructions (Signed)
Medication Instructions:  Your physician recommends that you continue on your current medications as directed. Please refer to the Current Medication list given to you today.  *If you need a refill on your cardiac medications before your next appointment, please call your pharmacy*  Lab Work: NONE ordered at this time of appointment   If you have labs (blood work) drawn today and your tests are completely normal, you will receive your results only by: . MyChart Message (if you have MyChart) OR . A paper copy in the mail If you have any lab test that is abnormal or we need to change your treatment, we will call you to review the results.  Testing/Procedures: NONE ordered at this time of appointment   Follow-Up: At [facility], you and your health needs are our priority.  As part of our continuing mission to provide you with exceptional heart care, we have created designated Provider Care Teams.  These Care Teams include your primary Cardiologist (physician) and Advanced Practice Providers (APPs -  Physician Assistants and Nurse Practitioners) who all work together to provide you with the care you need, when you need it.  Your next appointment:   12 month(s)  The format for your next appointment:   In Person  Provider:   Peter Jordan, MD  Other Instructions   

## 2020-09-25 NOTE — Progress Notes (Signed)
Virtual Visit via Video Note   This visit type was conducted due to national recommendations for restrictions regarding the COVID-19 Pandemic (e.g. social distancing) in an effort to limit this patient's exposure and mitigate transmission in our community.  Due to his co-morbid illnesses, this patient is at least at moderate risk for complications without adequate follow up.  This format is felt to be most appropriate for this patient at this time.  All issues noted in this document were discussed and addressed.  A limited physical exam was performed with this format.  Please refer to the patient's chart for his consent to telehealth for [facility].       Date:  09/26/2020   ID:  Andrew Oconnor, DOB [birth year], MRN [id number] The patient was identified using 2 identifiers.  Patient Location: Home Provider Location: Home Office  PCP:  Tisovec, Fransico Him, MD  Cardiologist:  Peter Martinique, MD  Electrophysiologist:  None   Evaluation Performed:  Follow-Up Visit  Chief Complaint:  Follow up  History of Present Illness:    Andrew Oconnor is a 65 y.o. 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  He had a cardiac catheterization on 11/24/2014 which demonstrated normal anatomy, normal LV function, normal EDP. Echocardiogram showed EF 55 to 40%, normal diastolic function.  Heart monitor obtained in May 2016 showed 7 beats run of NSVT.  This is controlled on beta-blocker.  I last saw the patient October 2019 at which time he has noticed occasional chest tightness that occurs both at rest and with exertion.  Given previous normal coronary arteries on cardiac catheterization, I recommended plenty of treadmill test which came back negative.  Due to her occasional palpitations, I decreased benazepril to 5 mg daily and increase Toprol-XL to 25 mg twice a day.  He presents today for virtual visit.  He denies any significant palpitation or chest  discomfort.  He has no lower extremity edema, orthopnea or PND.  Instead of going down to 5 mg daily of benazepril, he stayed on the 10 mg daily.  He is blood pressure is very well controlled.  He has not experienced any significant palpitation.  I recommended continuing on the current therapy and follow-up in 1 year.  The patient does not have symptoms concerning for COVID-19 infection (fever, chills, cough, or new shortness of breath).    Past Medical History:  Diagnosis Date  . Allergy   . Anxiety   . Carpal tunnel syndrome   . Cataract   . CHF (congestive heart failure) ([place])   . Colon polyp   . Depression   . Diabetes mellitus without complication ([place])   . Erectile dysfunction   . GERD (gastroesophageal reflux disease)   . Hiatal hernia   . History of diplopia     third nerve palsy 2013   . Hyperlipidemia   . Hypertension   . Obesity   . Sleep apnea   . Sleep apnea with use of continuous positive airway pressure (CPAP)    diagnsed AHi of 20 in 2010 , titrated to 13 cm H20.  . Thyroid disorder    Past Surgical History:  Procedure Laterality Date  . CATARACT EXTRACTION W/PHACO Right 10/09/2015   Procedure: CATARACT EXTRACTION PHACO AND INTRAOCULAR LENS PLACEMENT (IOC);  Surgeon: Williams Che, MD;  Location: [facility];  Service: Ophthalmology;  Laterality: Right;  CDE:2.17  . HAND SURGERY Bilateral    secondary to nerve damage  .  hemorrectomy    . LEFT HEART CATHETERIZATION WITH CORONARY ANGIOGRAM N/A 11/24/2014   Procedure: LEFT HEART CATHETERIZATION WITH CORONARY ANGIOGRAM;  Surgeon: Peter M Martinique, MD;  Location: [facility] CATH LAB;  Service: Cardiovascular;  Laterality: N/A;     Current Meds  Medication Sig  . Blood Glucose Calibration (OT ULTRA/FASTTK CNTRL SOLN) SOLN   . hydrochlorothiazide (HYDRODIURIL) 25 MG tablet Take 1/2 tablet as needed for bp > 160  . insulin lispro (HUMALOG) 100 UNIT/ML cartridge Inject 80 Units into the skin daily.  Marland Kitchen levothyroxine (SYNTHROID,  LEVOTHROID) 150 MCG tablet Take 150 mcg by mouth daily before breakfast.   . omeprazole (PRILOSEC) 40 MG capsule Take 40 mg by mouth daily.  . pravastatin (PRAVACHOL) 40 MG tablet Take 40 mg by mouth daily.   Marland Kitchen spironolactone-hydrochlorothiazide (ALDACTAZIDE) 25-25 MG tablet TAKE 1 TABLET BY MOUTH DAILY.  . [DISCONTINUED] benazepril (LOTENSIN) 5 MG tablet Take 1 tablet (5 mg total) by mouth daily. Please schedule annual appt for refills. [phone]. 1st attempt.  . [DISCONTINUED] metoprolol succinate (TOPROL-XL) 25 MG 24 hr tablet TAKE 1 TABLET BY MOUTH 2 (TWO) TIMES DAILY. KEEP APPOINTMENT 09/25/20 FOR ADDITIONAL REFILLS   Current Facility-Administered Medications for the 09/25/20 encounter (Video Visit) with Almyra Deforest, PA  Medication  . aspirin chewable tablet 81 mg     Allergies:   Patient has no known allergies.   Social History   Tobacco Use  . Smoking status: Former Smoker    Quit date: 09/09/2002    Years since quitting: 18.0  . Smokeless tobacco: Never Used  . Tobacco comment: quit smoking 2004  Vaping Use  . Vaping Use: Never used  Substance Use Topics  . Alcohol use: Yes    Comment: occas.  . Drug use: No     Family Hx: The patient's family history includes Cancer in his sister; Diabetes in his brother and father; Heart disease in his brother, father, maternal grandfather, maternal grandmother, mother, paternal grandfather, paternal grandmother, and sister; Seizures in his father; Stroke in his father and another family member. There is no history of Colon cancer, Esophageal cancer, Rectal cancer, or Stomach cancer.  ROS:   Please see the history of present illness.     All other systems reviewed and are negative.   Prior CV studies:   The following studies were reviewed today:  Echo 11/25/2014 LV EF: 55% -  60%   -------------------------------------------------------------------  Indications:   Dyspnea 786.09.    -------------------------------------------------------------------  History:  PMH:  Angina pectoris. Risk factors: Sleep apnea.  Hypertension. Diabetes mellitus. Dyslipidemia.   -------------------------------------------------------------------  Study Conclusions   - Left ventricle: The cavity size was normal. Systolic function was  normal. The estimated ejection fraction was in the range of 55%  to 60%. Wall motion was normal; there were no regional wall  motion abnormalities. Left ventricular diastolic function  parameters were normal.  - Left atrium: The atrium was mildly dilated.  - Atrial septum: No defect or patent foramen ovale was identified.  - Pericardium, extracardiac: A trivial pericardial effusion was  identified posterior to the heart.    ETT 07/14/2018  Blood pressure demonstrated a hypertensive response to exercise.  There was no ST segment deviation noted during stress.  No T wave inversion was noted during stress.  Negative, adequate stress test.     Labs/Other Tests and Data Reviewed:    EKG:  An ECG dated 07/06/2018 was personally reviewed today and demonstrated:  Sinus rhythm without significant ST-T wave  changes.  Recent Labs: No results found for requested labs within last 8760 hours.   Recent Lipid Panel No results found for: CHOL, TRIG, HDL, CHOLHDL, LDLCALC, LDLDIRECT  Wt Readings from Last 3 Encounters:  09/25/20 147 lb 6.4 oz (66.9 kg)  10/02/18 148 lb (67.1 kg)  09/18/18 150 lb (68 kg)     Risk Assessment/Calculations:      Objective:    Vital Signs:  BP 134/60   Pulse 62   Ht 4\' 7"  (1.397 m)   Wt 147 lb 6.4 oz (66.9 kg)   BMI 34.26 kg/m    VITAL SIGNS:  reviewed  ASSESSMENT & PLAN:    1. History of NSVT: Seen on previous heart monitor.  EGD was negative in 2019.  Cardiac catheterization showed normal coronary arteries.  No palpitation or dizziness on higher dose of beta-blocker   2. Hypertension: Blood  pressure well controlled on current therapy.  Although I previously decreased benazepril to 5 mg daily and increase his metoprolol succinate to 25 mg twice daily, he stayed on the 10 mg of benazepril after increasing on the metoprolol succinate.  Blood pressure is very well controlled on the current regimen, therefore I opted to leave him on the current therapy  3. Hyperlipidemia: On pravastatin  4. DM2: On insulin, managed by primary care provider  5. Hypothyroidism: On levothyroxine        COVID-19 Education: The signs and symptoms of COVID-19 were discussed with the patient and how to seek care for testing (follow up with PCP or arrange E-visit).  The importance of social distancing was discussed today.  Time:   Today, I have spent 11 minutes with the patient with telehealth technology discussing the above problems.     Medication Adjustments/Labs and Tests Ordered: Current medicines are reviewed at length with the patient today.  Concerns regarding medicines are outlined above.   Tests Ordered: No orders of the defined types were placed in this encounter.   Medication Changes: Meds ordered this encounter  Medications  . benazepril (LOTENSIN) 10 MG tablet    Sig: Take 1 tablet (10 mg total) by mouth daily.    Dispense:  90 tablet    Refill:  3  . metoprolol succinate (TOPROL-XL) 25 MG 24 hr tablet    Sig: Take 1 tablet (25 mg total) by mouth in the morning and at bedtime.    Dispense:  60 tablet    Refill:  0    Follow Up:  In Person in 1 year(s)  Signed, Almyra Deforest, [place]  09/26/2020 8:57 PM    [facility]

## 2020-09-25 NOTE — Telephone Encounter (Signed)
Called patient Andrew Oconnor to discuss AVS instructions gave [name]'s recommendations and patient voiced understanding. AVS summary sent to patient via MyChart.

## 2020-09-25 NOTE — Telephone Encounter (Signed)
  Patient Consent for Virtual Visit         Andrew Oconnor has provided verbal consent on 09/25/2020 for a virtual visit (video or telephone).   CONSENT FOR VIRTUAL VISIT FOR:  Andrew Oconnor  By participating in this virtual visit I agree to the following:  I hereby voluntarily request, consent and authorize [facility] and its employed or contracted physicians, physician assistants, nurse practitioners or other licensed health care professionals (the Practitioner), to provide me with telemedicine health care services (the "Services") as deemed necessary by the treating Practitioner. I acknowledge and consent to receive the Services by the Practitioner via telemedicine. I understand that the telemedicine visit will involve communicating with the Practitioner through live audiovisual communication technology and the disclosure of certain medical information by electronic transmission. I acknowledge that I have been given the opportunity to request an in-person assessment or other available alternative prior to the telemedicine visit and am voluntarily participating in the telemedicine visit.  I understand that I have the right to withhold or withdraw my consent to the use of telemedicine in the course of my care at any time, without affecting my right to future care or treatment, and that the Practitioner or I may terminate the telemedicine visit at any time. I understand that I have the right to inspect all information obtained and/or recorded in the course of the telemedicine visit and may receive copies of available information for a reasonable fee.  I understand that some of the potential risks of receiving the Services via telemedicine include:  Marland Kitchen Delay or interruption in medical evaluation due to technological equipment failure or disruption; . Information transmitted may not be sufficient (e.g. poor resolution of images) to allow for appropriate medical decision making by the  Practitioner; and/or  . In rare instances, security protocols could fail, causing a breach of personal health information.  Furthermore, I acknowledge that it is my responsibility to provide information about my medical history, conditions and care that is complete and accurate to the best of my ability. I acknowledge that Practitioner's advice, recommendations, and/or decision may be based on factors not within their control, such as incomplete or inaccurate data provided by me or distortions of diagnostic images or specimens that may result from electronic transmissions. I understand that the practice of medicine is not an exact science and that Practitioner makes no warranties or guarantees regarding treatment outcomes. I acknowledge that a copy of this consent can be made available to me via my patient portal ([facility]), or I can request a printed copy by calling the office of [facility].    I understand that my insurance will be billed for this visit.   I have read or had this consent read to me. . I understand the contents of this consent, which adequately explains the benefits and risks of the Services being provided via telemedicine.  . I have been provided ample opportunity to ask questions regarding this consent and the Services and have had my questions answered to my satisfaction. . I give my informed consent for the services to be provided through the use of telemedicine in my medical care

## 2020-09-26 ENCOUNTER — Encounter: Payer: Self-pay | Admitting: Physician Assistant

## 2020-10-20 DIAGNOSIS — M7711 Lateral epicondylitis, right elbow: Secondary | ICD-10-CM | POA: Diagnosis not present

## 2020-11-08 DIAGNOSIS — M7711 Lateral epicondylitis, right elbow: Secondary | ICD-10-CM | POA: Diagnosis not present

## 2020-11-14 DIAGNOSIS — M7711 Lateral epicondylitis, right elbow: Secondary | ICD-10-CM | POA: Diagnosis not present

## 2020-11-17 ENCOUNTER — Telehealth: Payer: Self-pay | Admitting: *Deleted

## 2020-11-17 NOTE — Telephone Encounter (Signed)
Ravindra R. Pruden 65 year old male is requesting cardiac clearance for a right elbow lateral epicondyle tendon repair and debridement.  He was last seen virtually on 09/25/2020.  He denied significant palpitations and chest discomfort.  He denied lower extremity edema orthopnea PND.  His blood pressure was well controlled.  Follow-up was planned for 1 year.  His PMH includes IDDM, HTN, HLD, OSA, hypothyroidism, and chronic diastolic CHF.  Interim cardiac catheterization 11/24/2014 which showed normal anatomy and normal LV function with normal LVEDP.  Echocardiogram showed EF 12-81%, normal diastolic function.  Heart monitor 5/16 showed 7 beats of NSVT which is controlled with beta-blocker therapy.  May his aspirin be held prior to his procedure?  Thank you for your help.  Please direct response to CV DIV preop will.  Jossie Ng. Christen Wardrop NP-C    11/17/2020, 10:26 AM [address] Office [phone] Fax [phone]

## 2020-11-17 NOTE — Telephone Encounter (Signed)
   Primary Cardiologist: Peter Martinique, MD  Chart reviewed as part of pre-operative protocol coverage. Given past medical history and time since last visit, based on ACC/AHA guidelines, Andrew Oconnor would be at acceptable risk for the planned procedure without further cardiovascular testing.   His aspirin may be held for 5 days prior to his procedure.  Please resume as soon as hemostasis is achieved.  I will route this recommendation to the requesting party via Epic fax function and remove from pre-op pool.  Please call with questions.  Jossie Ng. Bianca Raneri NP-C    11/17/2020, 11:46 AM [address] [place] Office [phone] Fax [phone]

## 2020-11-17 NOTE — Telephone Encounter (Signed)
   [facility] Pre-operative Risk Assessment    Primary Cardiologist- Dr Martinique  Request for surgical clearance:  1. What type of surgery is being performed?  Right elbow lateral epicondylar tendon debridement and repair as indicated  2. When is this surgery scheduled? 01/03/2021  3. What type of clearance is required (medical clearance vs. Pharmacy clearance to hold med vs. Both)? Medical   4. Are there any medications that need to be held prior to surgery and how long? Aspirin  For 5 days prior   5. Practice name and name of physician performing surgery? [facility] , Dr Iran Planas  6. What is the office phone number?  [phone]   7.   What is the office fax number?  [place]   8.   Anesthesia type (None, local, MAC, general) ? unknown   Andrew Oconnor 11/17/2020, 10:07 AM  _________________________________________________________________   (provider comments below)

## 2020-11-17 NOTE — Telephone Encounter (Signed)
Yes OK to hold ASA  Andrew Robison Martinique MD, [facility]

## 2020-11-28 ENCOUNTER — Other Ambulatory Visit: Payer: Self-pay | Admitting: Physician Assistant

## 2020-12-11 DIAGNOSIS — E1040 Type 1 diabetes mellitus with diabetic neuropathy, unspecified: Secondary | ICD-10-CM | POA: Diagnosis not present

## 2021-01-03 DIAGNOSIS — G8918 Other acute postprocedural pain: Secondary | ICD-10-CM | POA: Diagnosis not present

## 2021-01-03 DIAGNOSIS — M7712 Lateral epicondylitis, left elbow: Secondary | ICD-10-CM | POA: Diagnosis not present

## 2021-01-03 DIAGNOSIS — M7711 Lateral epicondylitis, right elbow: Secondary | ICD-10-CM | POA: Diagnosis not present

## 2021-01-18 DIAGNOSIS — M25521 Pain in right elbow: Secondary | ICD-10-CM | POA: Diagnosis not present

## 2021-01-23 ENCOUNTER — Other Ambulatory Visit: Payer: Self-pay | Admitting: Physician Assistant

## 2021-01-25 DIAGNOSIS — M25521 Pain in right elbow: Secondary | ICD-10-CM | POA: Diagnosis not present

## 2021-01-31 DIAGNOSIS — M25521 Pain in right elbow: Secondary | ICD-10-CM | POA: Diagnosis not present

## 2021-02-07 DIAGNOSIS — M25521 Pain in right elbow: Secondary | ICD-10-CM | POA: Diagnosis not present

## 2021-02-15 DIAGNOSIS — Z1152 Encounter for screening for COVID-19: Secondary | ICD-10-CM | POA: Diagnosis not present

## 2021-02-15 DIAGNOSIS — J069 Acute upper respiratory infection, unspecified: Secondary | ICD-10-CM | POA: Diagnosis not present

## 2021-02-15 DIAGNOSIS — R058 Other specified cough: Secondary | ICD-10-CM | POA: Diagnosis not present

## 2021-02-16 DIAGNOSIS — R4702 Dysphasia: Secondary | ICD-10-CM | POA: Diagnosis not present

## 2021-02-16 DIAGNOSIS — K2100 Gastro-esophageal reflux disease with esophagitis, without bleeding: Secondary | ICD-10-CM | POA: Diagnosis not present

## 2021-02-16 DIAGNOSIS — R0602 Shortness of breath: Secondary | ICD-10-CM | POA: Diagnosis not present

## 2021-03-15 DIAGNOSIS — L02211 Cutaneous abscess of abdominal wall: Secondary | ICD-10-CM | POA: Diagnosis not present

## 2021-03-22 DIAGNOSIS — L02818 Cutaneous abscess of other sites: Secondary | ICD-10-CM | POA: Diagnosis not present

## 2021-04-09 DIAGNOSIS — E1040 Type 1 diabetes mellitus with diabetic neuropathy, unspecified: Secondary | ICD-10-CM | POA: Diagnosis not present

## 2021-04-16 DIAGNOSIS — E669 Obesity, unspecified: Secondary | ICD-10-CM | POA: Diagnosis not present

## 2021-04-16 DIAGNOSIS — E785 Hyperlipidemia, unspecified: Secondary | ICD-10-CM | POA: Diagnosis not present

## 2021-04-16 DIAGNOSIS — I10 Essential (primary) hypertension: Secondary | ICD-10-CM | POA: Diagnosis not present

## 2021-04-16 DIAGNOSIS — R609 Edema, unspecified: Secondary | ICD-10-CM | POA: Diagnosis not present

## 2021-04-16 DIAGNOSIS — E1165 Type 2 diabetes mellitus with hyperglycemia: Secondary | ICD-10-CM | POA: Diagnosis not present

## 2021-04-16 DIAGNOSIS — K219 Gastro-esophageal reflux disease without esophagitis: Secondary | ICD-10-CM | POA: Diagnosis not present

## 2021-04-16 DIAGNOSIS — E039 Hypothyroidism, unspecified: Secondary | ICD-10-CM | POA: Diagnosis not present

## 2021-04-16 DIAGNOSIS — Z794 Long term (current) use of insulin: Secondary | ICD-10-CM | POA: Diagnosis not present

## 2021-04-16 DIAGNOSIS — Z6832 Body mass index (BMI) 32.0-32.9, adult: Secondary | ICD-10-CM | POA: Diagnosis not present

## 2021-04-19 DIAGNOSIS — E118 Type 2 diabetes mellitus with unspecified complications: Secondary | ICD-10-CM | POA: Diagnosis not present

## 2021-04-19 DIAGNOSIS — M7711 Lateral epicondylitis, right elbow: Secondary | ICD-10-CM | POA: Diagnosis not present

## 2021-04-19 DIAGNOSIS — L02818 Cutaneous abscess of other sites: Secondary | ICD-10-CM | POA: Diagnosis not present

## 2021-04-19 DIAGNOSIS — G4730 Sleep apnea, unspecified: Secondary | ICD-10-CM | POA: Diagnosis not present

## 2021-04-19 DIAGNOSIS — Z794 Long term (current) use of insulin: Secondary | ICD-10-CM | POA: Diagnosis not present

## 2021-04-27 ENCOUNTER — Encounter (HOSPITAL_BASED_OUTPATIENT_CLINIC_OR_DEPARTMENT_OTHER): Payer: Self-pay | Admitting: General Surgery

## 2021-04-27 ENCOUNTER — Other Ambulatory Visit: Payer: Self-pay

## 2021-04-30 ENCOUNTER — Encounter (HOSPITAL_BASED_OUTPATIENT_CLINIC_OR_DEPARTMENT_OTHER)
Admission: RE | Admit: 2021-04-30 | Discharge: 2021-04-30 | Disposition: A | Discharge status: Home / Self Care | Payer: Medicare PPO | Source: Ambulatory Visit | Attending: General Surgery | Admitting: General Surgery

## 2021-04-30 DIAGNOSIS — Z01818 Encounter for other preprocedural examination: Secondary | ICD-10-CM | POA: Diagnosis not present

## 2021-04-30 LAB — BASIC METABOLIC PANEL
Anion gap: 8 (ref 5–15)
BUN: 13 mg/dL (ref 8–23)
CO2: 27 mmol/L (ref 22–32)
Calcium: 9.2 mg/dL (ref 8.9–10.3)
Chloride: 101 mmol/L (ref 98–111)
Creatinine, Ser: 0.82 mg/dL (ref 0.61–1.24)
GFR, Estimated: >60 mL/min (ref >60)
Glucose, Bld: 67 mg/dL — ABNORMAL LOW (ref 70–99)
Potassium: 4.2 mmol/L (ref 3.5–5.1)
Sodium: 136 mmol/L (ref 135–145)

## 2021-05-01 ENCOUNTER — Ambulatory Visit: Payer: Self-pay | Admitting: General Surgery

## 2021-05-04 ENCOUNTER — Ambulatory Visit (HOSPITAL_BASED_OUTPATIENT_CLINIC_OR_DEPARTMENT_OTHER): Payer: Medicare PPO | Admitting: Anesthesiology

## 2021-05-04 ENCOUNTER — Encounter (HOSPITAL_BASED_OUTPATIENT_CLINIC_OR_DEPARTMENT_OTHER)
Admission: RE | Disposition: A | Discharge status: Home / Self Care | Payer: Self-pay | Source: Home / Self Care | Attending: General Surgery

## 2021-05-04 ENCOUNTER — Other Ambulatory Visit: Payer: Self-pay

## 2021-05-04 ENCOUNTER — Encounter (HOSPITAL_BASED_OUTPATIENT_CLINIC_OR_DEPARTMENT_OTHER): Payer: Self-pay | Admitting: General Surgery

## 2021-05-04 ENCOUNTER — Ambulatory Visit (HOSPITAL_BASED_OUTPATIENT_CLINIC_OR_DEPARTMENT_OTHER)
Admission: RE | Admit: 2021-05-04 | Discharge: 2021-05-04 | Disposition: A | Discharge status: Home / Self Care | Payer: Medicare PPO | Attending: General Surgery | Admitting: General Surgery

## 2021-05-04 DIAGNOSIS — I509 Heart failure, unspecified: Secondary | ICD-10-CM | POA: Diagnosis not present

## 2021-05-04 DIAGNOSIS — E119 Type 2 diabetes mellitus without complications: Secondary | ICD-10-CM | POA: Insufficient documentation

## 2021-05-04 DIAGNOSIS — Z8349 Family history of other endocrine, nutritional and metabolic diseases: Secondary | ICD-10-CM | POA: Diagnosis not present

## 2021-05-04 DIAGNOSIS — G4730 Sleep apnea, unspecified: Secondary | ICD-10-CM | POA: Diagnosis not present

## 2021-05-04 DIAGNOSIS — L02214 Cutaneous abscess of groin: Secondary | ICD-10-CM | POA: Diagnosis not present

## 2021-05-04 DIAGNOSIS — Z79890 Hormone replacement therapy (postmenopausal): Secondary | ICD-10-CM | POA: Diagnosis not present

## 2021-05-04 DIAGNOSIS — Z8249 Family history of ischemic heart disease and other diseases of the circulatory system: Secondary | ICD-10-CM | POA: Diagnosis not present

## 2021-05-04 DIAGNOSIS — Z9641 Presence of insulin pump (external) (internal): Secondary | ICD-10-CM | POA: Insufficient documentation

## 2021-05-04 DIAGNOSIS — L02211 Cutaneous abscess of abdominal wall: Secondary | ICD-10-CM | POA: Diagnosis not present

## 2021-05-04 DIAGNOSIS — Z794 Long term (current) use of insulin: Secondary | ICD-10-CM | POA: Insufficient documentation

## 2021-05-04 DIAGNOSIS — Z888 Allergy status to other drugs, medicaments and biological substances status: Secondary | ICD-10-CM | POA: Diagnosis not present

## 2021-05-04 DIAGNOSIS — Z87891 Personal history of nicotine dependence: Secondary | ICD-10-CM | POA: Diagnosis not present

## 2021-05-04 DIAGNOSIS — I110 Hypertensive heart disease with heart failure: Secondary | ICD-10-CM | POA: Diagnosis not present

## 2021-05-04 DIAGNOSIS — L723 Sebaceous cyst: Secondary | ICD-10-CM | POA: Diagnosis not present

## 2021-05-04 DIAGNOSIS — E079 Disorder of thyroid, unspecified: Secondary | ICD-10-CM | POA: Insufficient documentation

## 2021-05-04 DIAGNOSIS — Z79899 Other long term (current) drug therapy: Secondary | ICD-10-CM | POA: Insufficient documentation

## 2021-05-04 DIAGNOSIS — Z833 Family history of diabetes mellitus: Secondary | ICD-10-CM | POA: Diagnosis not present

## 2021-05-04 DIAGNOSIS — E785 Hyperlipidemia, unspecified: Secondary | ICD-10-CM | POA: Insufficient documentation

## 2021-05-04 DIAGNOSIS — Z9989 Dependence on other enabling machines and devices: Secondary | ICD-10-CM | POA: Diagnosis not present

## 2021-05-04 DIAGNOSIS — Z6832 Body mass index (BMI) 32.0-32.9, adult: Secondary | ICD-10-CM | POA: Diagnosis not present

## 2021-05-04 DIAGNOSIS — E039 Hypothyroidism, unspecified: Secondary | ICD-10-CM | POA: Diagnosis not present

## 2021-05-04 HISTORY — PX: LESION REMOVAL: SHX5196

## 2021-05-04 LAB — GLUCOSE, CAPILLARY
Glucose-Capillary: 164 mg/dL — ABNORMAL HIGH (ref 70–99)
Glucose-Capillary: 86 mg/dL (ref 70–99)

## 2021-05-04 SURGERY — WIDE EXCISION, LESION, UPPER EXTREMITY
Anesthesia: General

## 2021-05-04 MED ORDER — KETOROLAC TROMETHAMINE 30 MG/ML IJ SOLN
30.0000 mg | Freq: Once | INTRAMUSCULAR | Status: AC
  Administered 2021-05-04: 30 mg via INTRAVENOUS

## 2021-05-04 MED ORDER — LIDOCAINE 2% (20 MG/ML) 5 ML SYRINGE
INTRAMUSCULAR | Status: DC | PRN
  Administered 2021-05-04: 40 mg via INTRAVENOUS

## 2021-05-04 MED ORDER — ONDANSETRON HCL 4 MG/2ML IJ SOLN
INTRAMUSCULAR | Status: AC
  Filled 2021-05-04: qty 2

## 2021-05-04 MED ORDER — DEXAMETHASONE SODIUM PHOSPHATE 4 MG/ML IJ SOLN
INTRAMUSCULAR | Status: DC | PRN
  Administered 2021-05-04: 4 mg via INTRAVENOUS

## 2021-05-04 MED ORDER — CHLORHEXIDINE GLUCONATE CLOTH 2 % EX PADS: 6.0000 | MEDICATED_PAD | Freq: Once | CUTANEOUS | Status: DC

## 2021-05-04 MED ORDER — KETOROLAC TROMETHAMINE 30 MG/ML IJ SOLN
INTRAMUSCULAR | Status: AC
  Filled 2021-05-04: qty 1

## 2021-05-04 MED ORDER — CEFAZOLIN SODIUM-DEXTROSE 2-4 GM/100ML-% IV SOLN
2.0000 g | INTRAVENOUS | Status: AC
  Administered 2021-05-04: 2 g via INTRAVENOUS

## 2021-05-04 MED ORDER — PROPOFOL 10 MG/ML IV BOLUS
INTRAVENOUS | Status: AC
  Filled 2021-05-04: qty 20

## 2021-05-04 MED ORDER — PROPOFOL 10 MG/ML IV BOLUS
INTRAVENOUS | Status: DC | PRN
  Administered 2021-05-04: 50 mg via INTRAVENOUS
  Administered 2021-05-04: 200 mg via INTRAVENOUS
  Administered 2021-05-04: 150 mg via INTRAVENOUS

## 2021-05-04 MED ORDER — TRAMADOL HCL 50 MG PO TABS: 50.0000 mg | ORAL_TABLET | Freq: Four times a day (QID) | ORAL | 0 refills | Status: AC | PRN

## 2021-05-04 MED ORDER — CEFAZOLIN SODIUM-DEXTROSE 2-4 GM/100ML-% IV SOLN
INTRAVENOUS | Status: AC
  Filled 2021-05-04: qty 100

## 2021-05-04 MED ORDER — LIDOCAINE HCL (PF) 2 % IJ SOLN
INTRAMUSCULAR | Status: AC
  Filled 2021-05-04: qty 5

## 2021-05-04 MED ORDER — ACETAMINOPHEN 500 MG PO TABS
1000.0000 mg | ORAL_TABLET | ORAL | Status: AC
  Administered 2021-05-04: 1000 mg via ORAL

## 2021-05-04 MED ORDER — BUPIVACAINE-EPINEPHRINE 0.25% -1:200000 IJ SOLN
INTRAMUSCULAR | Status: DC | PRN
  Administered 2021-05-04: 10 mL

## 2021-05-04 MED ORDER — ACETAMINOPHEN 500 MG PO TABS: 1000.0000 mg | ORAL_TABLET | Freq: Three times a day (TID) | ORAL | 0 refills | Status: AC

## 2021-05-04 MED ORDER — ONDANSETRON HCL 4 MG/2ML IJ SOLN
INTRAMUSCULAR | Status: DC | PRN
  Administered 2021-05-04: 4 mg via INTRAVENOUS

## 2021-05-04 MED ORDER — FENTANYL CITRATE (PF) 100 MCG/2ML IJ SOLN
25.0000 ug | INTRAMUSCULAR | Status: DC | PRN
  Administered 2021-05-04: 50 ug via INTRAVENOUS

## 2021-05-04 MED ORDER — AMISULPRIDE (ANTIEMETIC) 5 MG/2ML IV SOLN: 10.0000 mg | Freq: Once | INTRAVENOUS | Status: DC | PRN

## 2021-05-04 MED ORDER — FENTANYL CITRATE (PF) 100 MCG/2ML IJ SOLN
INTRAMUSCULAR | Status: AC
  Filled 2021-05-04: qty 2

## 2021-05-04 MED ORDER — FENTANYL CITRATE (PF) 100 MCG/2ML IJ SOLN
INTRAMUSCULAR | Status: DC | PRN
  Administered 2021-05-04: 50 ug via INTRAVENOUS

## 2021-05-04 MED ORDER — LACTATED RINGERS IV SOLN: INTRAVENOUS | Status: DC

## 2021-05-04 MED ORDER — PROPOFOL 500 MG/50ML IV EMUL
INTRAVENOUS | Status: AC
  Filled 2021-05-04: qty 50

## 2021-05-04 MED ORDER — ACETAMINOPHEN 500 MG PO TABS
ORAL_TABLET | ORAL | Status: AC
  Filled 2021-05-04: qty 2

## 2021-05-04 MED ORDER — DEXAMETHASONE SODIUM PHOSPHATE 10 MG/ML IJ SOLN
INTRAMUSCULAR | Status: AC
  Filled 2021-05-04: qty 1

## 2021-05-04 MED ORDER — ENSURE PRE-SURGERY PO LIQD: 296.0000 mL | Freq: Once | ORAL | Status: DC

## 2021-05-04 SURGICAL SUPPLY — 52 items
ADH SKN CLS APL DERMABOND .7 (GAUZE/BANDAGES/DRESSINGS)
APL PRP STRL LF DISP 70% ISPRP (MISCELLANEOUS) ×1
APL SKNCLS STERI-STRIP NONHPOA (GAUZE/BANDAGES/DRESSINGS)
BENZOIN TINCTURE PRP APPL 2/3 (GAUZE/BANDAGES/DRESSINGS) IMPLANT
BLADE CLIPPER SURG (BLADE) IMPLANT
BLADE HEX COATED 2.75 (ELECTRODE) IMPLANT
BLADE SURG 15 STRL LF DISP TIS (BLADE) ×1 IMPLANT
BLADE SURG 15 STRL SS (BLADE) ×2
CANISTER SUCT 1200ML W/VALVE (MISCELLANEOUS) IMPLANT
CHLORAPREP W/TINT 26 (MISCELLANEOUS) ×2 IMPLANT
COVER BACK TABLE 60X90IN (DRAPES) ×2 IMPLANT
COVER MAYO STAND STRL (DRAPES) ×2 IMPLANT
DECANTER SPIKE VIAL GLASS SM (MISCELLANEOUS) IMPLANT
DERMABOND ADVANCED (GAUZE/BANDAGES/DRESSINGS)
DERMABOND ADVANCED .7 DNX12 (GAUZE/BANDAGES/DRESSINGS) IMPLANT
DRAPE LAPAROTOMY 100X72 PEDS (DRAPES) ×2 IMPLANT
DRAPE UTILITY XL STRL (DRAPES) ×2 IMPLANT
DRSG TEGADERM 4X4.75 (GAUZE/BANDAGES/DRESSINGS) IMPLANT
ELECT COATED BLADE 2.86 ST (ELECTRODE) IMPLANT
ELECT REM PT RETURN 9FT ADLT (ELECTROSURGICAL) ×2
ELECTRODE REM PT RTRN 9FT ADLT (ELECTROSURGICAL) ×1 IMPLANT
GAUZE PACKING IODOFORM 1/4X15 (PACKING) IMPLANT
GAUZE SPONGE 4X4 12PLY STRL (GAUZE/BANDAGES/DRESSINGS) IMPLANT
GAUZE SPONGE 4X4 12PLY STRL LF (GAUZE/BANDAGES/DRESSINGS) IMPLANT
GLOVE SRG 8 PF TXTR STRL LF DI (GLOVE) ×1 IMPLANT
GLOVE SURG ENC MOIS LTX SZ7.5 (GLOVE) ×2 IMPLANT
GLOVE SURG UNDER POLY LF SZ8 (GLOVE) ×2
GOWN STRL REUS W/ TWL LRG LVL3 (GOWN DISPOSABLE) ×1 IMPLANT
GOWN STRL REUS W/ TWL XL LVL3 (GOWN DISPOSABLE) ×1 IMPLANT
GOWN STRL REUS W/TWL LRG LVL3 (GOWN DISPOSABLE) ×2
GOWN STRL REUS W/TWL XL LVL3 (GOWN DISPOSABLE) ×2
NEEDLE HYPO 25X1 1.5 SAFETY (NEEDLE) ×2 IMPLANT
NS IRRIG 1000ML POUR BTL (IV SOLUTION) IMPLANT
PACK BASIN DAY SURGERY FS (CUSTOM PROCEDURE TRAY) ×2 IMPLANT
PENCIL SMOKE EVACUATOR (MISCELLANEOUS) ×2 IMPLANT
SLEEVE SCD COMPRESS KNEE MED (STOCKING) ×2 IMPLANT
STRIP CLOSURE SKIN 1/2X4 (GAUZE/BANDAGES/DRESSINGS) IMPLANT
SUT ETHILON 2 0 FS 18 (SUTURE) IMPLANT
SUT ETHILON 4 0 PS 2 18 (SUTURE) IMPLANT
SUT MNCRL AB 4-0 PS2 18 (SUTURE) ×2 IMPLANT
SUT SILK 2 0 SH (SUTURE) IMPLANT
SUT VIC AB 2-0 SH 27 (SUTURE)
SUT VIC AB 2-0 SH 27XBRD (SUTURE) IMPLANT
SUT VICRYL 3-0 CR8 SH (SUTURE) IMPLANT
SUT VICRYL 4-0 PS2 18IN ABS (SUTURE) IMPLANT
SWAB COLLECTION DEVICE MRSA (MISCELLANEOUS) IMPLANT
SWAB CULTURE ESWAB REG 1ML (MISCELLANEOUS) IMPLANT
SYR CONTROL 10ML LL (SYRINGE) ×2 IMPLANT
TOWEL GREEN STERILE FF (TOWEL DISPOSABLE) ×2 IMPLANT
TUBE CONNECTING 20X1/4 (TUBING) IMPLANT
UNDERPAD 30X36 HEAVY ABSORB (UNDERPADS AND DIAPERS) IMPLANT
YANKAUER SUCT BULB TIP NO VENT (SUCTIONS) IMPLANT

## 2021-05-04 NOTE — Transfer of Care (Signed)
Immediate Anesthesia Transfer of Care Note  Patient: Andrew Oconnor  Procedure(s) Performed: EXCISION OF CHRONIC SUPRAPUBIC ABSCESS  Patient Location: PACU  Anesthesia Type:General  Level of Consciousness: drowsy  Airway & Oxygen Therapy: Patient Spontanous Breathing and Patient connected to face mask oxygen  Post-op Assessment: Report given to RN and Post -op Vital signs reviewed and stable  Post vital signs: Reviewed and stable  Last Vitals:  Vitals Value Taken Time  BP    Temp    Pulse 64 05/04/21 1149  Resp    SpO2 99 % 05/04/21 1149  Vitals shown include unvalidated device data.  Last Pain:  Vitals:   05/04/21 0951  TempSrc: Oral  PainSc: 0-No pain         Complications: No notable events documented.

## 2021-05-04 NOTE — Discharge Instructions (Addendum)
[facility]  POST OP INSTRUCTIONS Always review your discharge instruction sheet given to you by the facility where your surgery was performed. IF YOU HAVE DISABILITY OR FAMILY LEAVE FORMS, YOU MUST BRING THEM TO THE OFFICE FOR PROCESSING.   DO NOT GIVE THEM TO YOUR DOCTOR.  PAIN CONTROL  First take acetaminophen (Tylenol) AND/or ibuprofen (Advil) to control your pain after surgery.  Follow directions on package.  Taking acetaminophen (Tylenol) and/or ibuprofen (Advil) regularly after surgery will help to control your pain and lower the amount of prescription pain medication you may need.  You should not take more than 3,000 mg (3 grams) of acetaminophen (Tylenol) in 24 hours.  You should not take ibuprofen (Advil), aleve, motrin, naprosyn or other NSAIDS if you have a history of stomach ulcers or chronic kidney disease.  A prescription for pain medication may be given to you upon discharge.  Take your pain medication as prescribed, if you still have uncontrolled pain after taking acetaminophen (Tylenol) or ibuprofen (Advil). Use ice packs to help control pain. If you need a refill on your pain medication, please contact your pharmacy.  They will contact our office to request authorization. Prescriptions will not be filled after 5pm or on week-ends.  HOME MEDICATIONS Take your usually prescribed medications unless otherwise directed.  DIET You should follow a light diet the first few days after arrival home.  Be sure to include lots of fluids daily. Avoid fatty, fried foods.   CONSTIPATION It is common to experience some constipation after surgery and if you are taking pain medication.  Increasing fluid intake and taking a stool softener (such as Colace) will usually help or prevent this problem from occurring.  A mild laxative (Milk of Magnesia or Miralax) should be taken according to package instructions if there are no bowel movements after 48 hours.  WOUND/INCISION  CARE Most patients will experience some swelling and bruising in the area of the incisions.  Ice packs will help.  Swelling and bruising can take several days to resolve.  Unless discharge instructions indicate otherwise, follow guidelines below  STERI-STRIPS - you may remove your outer bandages 48 hours after surgery, and you may shower at that time.  You have steri-strips (small skin tapes) in place directly over the incision.  These strips should be left on the skin for 7-10 days.   DERMABOND/SKIN GLUE - you may shower in 24 hours.  The glue will flake off over the next 2-3 weeks. Any sutures or staples will be removed at the office during your follow-up visit.  ACTIVITIES You may resume regular (light) daily activities beginning the next day--such as daily self-care, walking, climbing stairs--gradually increasing activities as tolerated.  You may have sexual intercourse when it is comfortable.  Refrain from any heavy lifting or straining until approved by your doctor. You may drive when you are no longer taking prescription pain medication, you can comfortably wear a seatbelt, and you can safely maneuver your car and apply brakes.  FOLLOW-UP You should see your doctor in the office for a follow-up appointment approximately 2-3 weeks after your surgery.  You should have been given your post-op/follow-up appointment when your surgery was scheduled.  If you did not receive a post-op/follow-up appointment, make sure that you call for this appointment within a day or two after you arrive home to insure a convenient appointment time.  OTHER INSTRUCTIONS   WHEN TO CALL YOUR DOCTOR: Fever over 101.0 Inability to urinate Continued bleeding  from incision. Increased pain, redness, or drainage from the incision. Increasing abdominal pain  The clinic staff is available to answer your questions during regular business hours.  Please don't hesitate to call and ask to speak to one of the nurses for  clinical concerns.  If you have a medical emergency, go to the nearest emergency room or call 911.  A surgeon from [facility] is always on call at the hospital. [address] [place], [place], [place]  [address] ? [address], [place], [place]   [address] [phone] ? [phone] ? FAX [phone] Web site: [email or web address]   Post Anesthesia Home Care Instructions  Activity: Get plenty of rest for the remainder of the day. A responsible individual must stay with you for 24 hours following the procedure.  For the next 24 hours, DO NOT: -Drive a car -Paediatric nurse -Drink alcoholic beverages -Take any medication unless instructed by your physician -Make any legal decisions or sign important papers.  Meals: Start with liquid foods such as gelatin or soup. Progress to regular foods as tolerated. Avoid greasy, spicy, heavy foods. If nausea and/or vomiting occur, drink only clear liquids until the nausea and/or vomiting subsides. Call your physician if vomiting continues.  Special Instructions/Symptoms: Your throat may feel dry or sore from the anesthesia or the breathing tube placed in your throat during surgery. If this causes discomfort, gargle with warm salt water. The discomfort should disappear within 24 hours.  If you had a scopolamine patch placed behind your ear for the management of post- operative nausea and/or vomiting:  1. The medication in the patch is effective for 72 hours, after which it should be removed.  Wrap patch in a tissue and discard in the trash. Wash hands thoroughly with soap and water. 2. You may remove the patch earlier than 72 hours if you experience unpleasant side effects which may include dry mouth, dizziness or visual disturbances. 3. Avoid touching the patch. Wash your hands with soap and water after contact with the patch.  No tylenol until after 4:30 today.  No ibuprofen until after 6:30 if needed today.

## 2021-05-04 NOTE — Anesthesia Preprocedure Evaluation (Signed)
Anesthesia Evaluation  Patient identified by MRN, date of birth, ID band Patient awake    Reviewed: Allergy & Precautions, NPO status , Patient's Chart, lab work & pertinent test results  Airway Mallampati: III  TM Distance: >3 FB Neck ROM: Full    Dental  (+) Dental Advisory Given   Pulmonary sleep apnea and Continuous Positive Airway Pressure Ventilation , former smoker,    breath sounds clear to auscultation       Cardiovascular hypertension, Pt. on medications and Pt. on home beta blockers +CHF   Rhythm:Regular Rate:Normal     Neuro/Psych negative neurological ROS     GI/Hepatic hiatal hernia, GERD  ,  Endo/Other  diabetes, Type 2, Insulin DependentHypothyroidism   Renal/GU      Musculoskeletal   Abdominal   Peds  Hematology   Anesthesia Other Findings   Reproductive/Obstetrics                             Anesthesia Physical Anesthesia Plan  ASA: 3  Anesthesia Plan: General   Post-op Pain Management:    Induction: Intravenous  PONV Risk Score and Plan: 2 and Dexamethasone, Ondansetron and Treatment may vary due to age or medical condition  Airway Management Planned: LMA  Additional Equipment: None  Intra-op Plan:   Post-operative Plan: Extubation in OR  Informed Consent: I have reviewed the patients History and Physical, chart, labs and discussed the procedure including the risks, benefits and alternatives for the proposed anesthesia with the patient or authorized representative who has indicated his/her understanding and acceptance.     Dental advisory given  Plan Discussed with: CRNA  Anesthesia Plan Comments:         Anesthesia Quick Evaluation

## 2021-05-04 NOTE — H&P (Signed)
PROVIDER: Geeta Dworkin Leanne Chang, MD  MRN: [id number] DOB: [birth year] DATE OF ENCOUNTER: 04/19/2021 Subjective   Chief Complaint: Abscess   History of Present Illness: Andrew Oconnor is a 65 y.o. male who is seen today for regarding his cutaneous abscess of his left suprapubic area. He saw our PA back in mid July. At that time he wanted to do medical management with hot compresses and medication. He called into the office saying that it was not working and he would like to speak to a Psychologist, sport and exercise. He states that it has been present for many years. He states that someone in our office previously incised and drained it over 10 years ago. He states for the past 6 months the area has waxed and waned where it would flare at times with intermittent yellowish drainage. No fever or chills. He does have diabetes and uses an insulin pump. His last A1c was 8.1. Very remote history of smoking more than 20 years ago. Denies MIs, TIAs or amaurosis fugax. He was cleared by cardiology to have shoulder surgery a few months ago. He denies any chest pain or chest pressure. He denies any there are lumps or bump.  Review of Systems: A complete review of systems was obtained from the patient. I have reviewed this information and discussed as appropriate with the patient. See HPI as well for other ROS.  ROS   Medical History: Past Medical History:  Diagnosis Date   Anxiety   Diabetes mellitus without complication (CMS-HCC)   Heart failure (CMS-HCC)   Hyperlipidemia   Hypertension   Sleep apnea   Thyroid disease   Patient Active Problem List  Diagnosis   Class 1 obesity due to excess calories with serious comorbidity in adult   Diabetes mellitus type 2, controlled (CMS-HCC)   Encounter for fitting and adjustment of insulin pump   Sleep apnea with use of continuous positive airway pressure (CPAP)   No past surgical history on file.   Allergies  Allergen Reactions   Donepezil Other (See Comments)    Ropinirole Hcl Other (See Comments)   Current Outpatient Medications on File Prior to Visit  Medication Sig Dispense Refill   benazepriL (LOTENSIN) 10 MG tablet Take by mouth   doxycycline (VIBRA-TABS) 100 MG tablet Take 100 mg by mouth 2 (two) times daily   levothyroxine (SYNTHROID) 150 MCG tablet TAKE 1 TABLET BY MOUTH ONCE DAILY ORALLY ONCE A DAY 30 DAY(S)   metoprolol succinate (TOPROL-XL) 25 MG XL tablet TAKE 1 TABLET (25 MG TOTAL) BY MOUTH IN THE MORNING AND AT BEDTIME.   NOVOLOG U-100 INSULIN ASPART injection (concentration 100 units/mL)   omeprazole (PRILOSEC) 40 MG DR capsule   pravastatin (PRAVACHOL) 40 MG tablet TAKE 1 TABLET BY MOUTH ONCE DAILY ORAL ONCE A DAY FOR90 DAYS   spironolactone-hydrochlorothiazide (ALDACTAZIDE) 25-25 mg tablet TAKE 1 TABLET BY MOUTH ONCE DAILY ORALLY ONCE A DAY 90 DAYS   No current facility-administered medications on file prior to visit.   Family History  Problem Relation Age of Onset   Obesity Mother   High blood pressure (Hypertension) Mother   Hyperlipidemia (Elevated cholesterol) Mother   Myocardial Infarction (Heart attack) Mother   Skin cancer Father   High blood pressure (Hypertension) Father   Hyperlipidemia (Elevated cholesterol) Father   Myocardial Infarction (Heart attack) Father   Diabetes Father   Deep vein thrombosis (DVT or abnormal blood clot formation) Father    Social History   Tobacco Use  Smoking Status Former Smoker  Years: 25.00   Types: Cigarettes  Smokeless Tobacco Never Used    Social History   Socioeconomic History   Marital status: Married  Tobacco Use   Smoking status: Former Smoker  Years: 25.00  Types: Cigarettes   Smokeless tobacco: Never Used  Scientific laboratory technician Use: Never used  Substance and Sexual Activity   Alcohol use: Not Currently   Drug use: Never   Objective:   Vitals:  04/19/21 1400  BP: 112/61  Pulse: 61  Temp: 37 C (98.6 F)  SpO2: 100%  Weight: 64 kg (141 lb)  Height:  139.7 cm (_0 )   Body mass index is 32.77 kg/m.  Gen: alert, NAD, non-toxic appearing Pupils: equal, no scleral icterus Pulm: Lungs clear to auscultation, symmetric chest rise CV: regular rate and rhythm Abd: soft, nontender, nondistended. No cellulitis. No incisional hernia Ext: no edema,  Skin: no rash, no jaundice; in the left suprapubic area there is a open sinus with about 1-1/2 to 2 cm of soft tissue swelling. No fluctuance or cellulitis  Labs, Imaging and Diagnostic Testing:  Reviewed cardiology note from spring 2022 Reviewed the PA note from July  Assessment and Plan:  Diagnoses and all orders for this visit:  Cutaneous abscess of other site  Sleep apnea with use of continuous positive airway pressure (CPAP)  Controlled type 2 diabetes mellitus with complication, with long-term current use of insulin (CMS-HCC)    This is not consistent with hidradenitis. Its either a chronic ingrown hair that has resulted in a subcutaneous abscess or a chronic sebaceous cyst. It has been bothering him for 6 months with intermittent flares of drainage and swelling and tenderness. At this point the best course of action is excisional debridement of the area. We discussed what that would involve. We could do it under MAC or general. We discussed that we would make an elliptical incision and excise the area and closed it in layers. We did discuss that he is at slight increased risk for wound infection because of the purulence in it as well as the fact that he is a diabetic. We discussed that should he get infected postoperatively he would have to heal by secondary intention. Otherwise we discussed bleeding, recurrence, injury to surrounding structures, perioperative cardiac and pulmonary events which would be unlikely. We discussed the typical recovery. He is interested in proceeding with surgery.  I told him our office would contact him sometime next week to schedule surgery.  This patient  encounter took 25 minutes today to perform the following: take history, perform exam, review outside records, interpret imaging, counsel the patient on their diagnosis and document encounter, findings & plan in the EHR  No follow-ups on file.  Leighton Ruff. Juaquina Machnik MD FACS General, Minimally Invasive, & Bariatric Surgery

## 2021-05-04 NOTE — Anesthesia Postprocedure Evaluation (Signed)
Anesthesia Post Note  Patient: Andrew Oconnor  Procedure(s) Performed: EXCISION OF CHRONIC SUPRAPUBIC ABSCESS     Patient location during evaluation: PACU Anesthesia Type: General Level of consciousness: awake and alert Pain management: pain level controlled Vital Signs Assessment: post-procedure vital signs reviewed and stable Respiratory status: spontaneous breathing, nonlabored ventilation, respiratory function stable and patient connected to nasal cannula oxygen Cardiovascular status: blood pressure returned to baseline and stable Postop Assessment: no apparent nausea or vomiting Anesthetic complications: no   No notable events documented.  Last Vitals:  Vitals:   05/04/21 1245 05/04/21 1317  BP: 120/63 122/62  Pulse: 61 70  Resp: (!) 8 14  Temp:  36.6 C  SpO2: 95% 97%    Last Pain:  Vitals:   05/04/21 1317  TempSrc:   PainSc: 3                  Tiajuana Amass

## 2021-05-04 NOTE — Interval H&P Note (Signed)
History and Physical Interval Note:  05/04/2021 10:59 AM  Andrew Oconnor  has presented today for surgery, with the diagnosis of SUPRAPUBIC ABSCESS.  The various methods of treatment have been discussed with the patient and family. After consideration of risks, benefits and other options for treatment, the patient has consented to  Procedure(s): EXCISION OF CHRONIC SUPRAPUBIC ABSCESS (N/A) as a surgical intervention.  The patient's history has been reviewed, patient examined, no change in status, stable for surgery.  I have reviewed the patient's chart and labs.  Questions were answered to the patient's satisfaction.    Discussed [facility] resident involvement  Greer Pickerel

## 2021-05-04 NOTE — Op Note (Addendum)
05/04/2021  11:45 AM  PATIENT:  Andrew Oconnor  65 y.o. 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   FINDINGS: Small sinus tract, chronically inflamed without signs of active infection   PROCEDURE DETAILS: Informed consent was obtained in the preoperative area prior to the procedure. The patient was brought to the operating room and placed on the table in the supine position. General anesthesia was induced. Perioperative antibiotics were administered per SCIP guidelines. The abdomen was prepped and draped in the usual sterile fashion. A pre-procedure timeout was taken verifying patient identity, surgical site and procedure to be performed.   An 3.5 cm x 1.5 cm elliptical incision was made over the chronic sinus tract and the chronically inflamed tissue was excised. The incision was taken down to the subcutaneous tissue with cautery. No active abscess was encountered and all chronically inflamed tissue was removed. Hemostasis was achieved. The skin was reapproximated with interrupted 3-0 Vicryl deep dermal sutures, and closed with a running subcuticular 4-0 monocryl suture. Dermabond was applied.   The patient tolerated the procedure with no apparent complications. All counts were correct x2 at the end of the procedure. The patient was extubated and taken to PACU in stable condition.  EBL:  1 mL   LOCAL MEDICATIONS USED:  MARCAINE and Amount: 20 ml  SPECIMEN: None  PLAN OF CARE: Discharge to home after PACU  PATIENT DISPOSITION:  PACU - hemodynamically stable.  I was present & scrubbed for the entire surgery  I have reviewed and agree with  the operative note as documented by the resident.   Leighton Ruff. Redmond Pulling, MD, FACS General, Bariatric, & Minimally Invasive Surgery [facility], [place]

## 2021-05-04 NOTE — Anesthesia Procedure Notes (Signed)
Procedure Name: LMA Insertion Date/Time: 05/04/2021 11:15 AM Performed by: Ezequiel Kayser, CRNA Pre-anesthesia Checklist: Patient identified, Emergency Drugs available, Suction available and Patient being monitored Patient Re-evaluated:Patient Re-evaluated prior to induction Oxygen Delivery Method: Circle System Utilized Preoxygenation: Pre-oxygenation with 100% oxygen Induction Type: IV induction Ventilation: Mask ventilation without difficulty LMA: LMA inserted LMA Size: 4.0 Number of attempts: 1 Airway Equipment and Method: Bite block Placement Confirmation: positive ETCO2 Tube secured with: Tape Dental Injury: Teeth and Oropharynx as per pre-operative assessment

## 2021-05-07 ENCOUNTER — Encounter (HOSPITAL_BASED_OUTPATIENT_CLINIC_OR_DEPARTMENT_OTHER): Payer: Self-pay | Admitting: General Surgery

## 2021-05-07 DIAGNOSIS — G4733 Obstructive sleep apnea (adult) (pediatric): Secondary | ICD-10-CM | POA: Diagnosis not present

## 2021-05-31 DIAGNOSIS — E7800 Pure hypercholesterolemia, unspecified: Secondary | ICD-10-CM | POA: Diagnosis not present

## 2021-05-31 DIAGNOSIS — E1065 Type 1 diabetes mellitus with hyperglycemia: Secondary | ICD-10-CM | POA: Diagnosis not present

## 2021-05-31 DIAGNOSIS — Z125 Encounter for screening for malignant neoplasm of prostate: Secondary | ICD-10-CM | POA: Diagnosis not present

## 2021-06-07 DIAGNOSIS — Z23 Encounter for immunization: Secondary | ICD-10-CM | POA: Diagnosis not present

## 2021-06-07 DIAGNOSIS — E10319 Type 1 diabetes mellitus with unspecified diabetic retinopathy without macular edema: Secondary | ICD-10-CM | POA: Diagnosis not present

## 2021-06-07 DIAGNOSIS — Z1331 Encounter for screening for depression: Secondary | ICD-10-CM | POA: Diagnosis not present

## 2021-06-07 DIAGNOSIS — D692 Other nonthrombocytopenic purpura: Secondary | ICD-10-CM | POA: Diagnosis not present

## 2021-06-07 DIAGNOSIS — E7800 Pure hypercholesterolemia, unspecified: Secondary | ICD-10-CM | POA: Diagnosis not present

## 2021-06-07 DIAGNOSIS — Z1389 Encounter for screening for other disorder: Secondary | ICD-10-CM | POA: Diagnosis not present

## 2021-06-07 DIAGNOSIS — R82998 Other abnormal findings in urine: Secondary | ICD-10-CM | POA: Diagnosis not present

## 2021-06-07 DIAGNOSIS — I110 Hypertensive heart disease with heart failure: Secondary | ICD-10-CM | POA: Diagnosis not present

## 2021-06-07 DIAGNOSIS — I5032 Chronic diastolic (congestive) heart failure: Secondary | ICD-10-CM | POA: Diagnosis not present

## 2021-06-07 DIAGNOSIS — E669 Obesity, unspecified: Secondary | ICD-10-CM | POA: Diagnosis not present

## 2021-06-07 DIAGNOSIS — Z4681 Encounter for fitting and adjustment of insulin pump: Secondary | ICD-10-CM | POA: Diagnosis not present

## 2021-06-07 DIAGNOSIS — Z1212 Encounter for screening for malignant neoplasm of rectum: Secondary | ICD-10-CM | POA: Diagnosis not present

## 2021-06-07 DIAGNOSIS — E1065 Type 1 diabetes mellitus with hyperglycemia: Secondary | ICD-10-CM | POA: Diagnosis not present

## 2021-06-07 DIAGNOSIS — Z0000 Encounter for general adult medical examination without abnormal findings: Secondary | ICD-10-CM | POA: Diagnosis not present

## 2021-06-08 ENCOUNTER — Telehealth: Payer: Self-pay

## 2021-06-08 NOTE — Telephone Encounter (Signed)
Faxed notes to [facility]

## 2021-06-11 DIAGNOSIS — H524 Presbyopia: Secondary | ICD-10-CM | POA: Diagnosis not present

## 2021-06-11 DIAGNOSIS — E119 Type 2 diabetes mellitus without complications: Secondary | ICD-10-CM | POA: Diagnosis not present

## 2021-07-27 DIAGNOSIS — E1065 Type 1 diabetes mellitus with hyperglycemia: Secondary | ICD-10-CM | POA: Diagnosis not present

## 2021-07-30 DIAGNOSIS — E1040 Type 1 diabetes mellitus with diabetic neuropathy, unspecified: Secondary | ICD-10-CM | POA: Diagnosis not present

## 2021-08-09 DIAGNOSIS — E1065 Type 1 diabetes mellitus with hyperglycemia: Secondary | ICD-10-CM | POA: Diagnosis not present

## 2021-08-20 DIAGNOSIS — Z4681 Encounter for fitting and adjustment of insulin pump: Secondary | ICD-10-CM | POA: Diagnosis not present

## 2021-08-20 DIAGNOSIS — I110 Hypertensive heart disease with heart failure: Secondary | ICD-10-CM | POA: Diagnosis not present

## 2021-08-20 DIAGNOSIS — E7800 Pure hypercholesterolemia, unspecified: Secondary | ICD-10-CM | POA: Diagnosis not present

## 2021-08-20 DIAGNOSIS — E10319 Type 1 diabetes mellitus with unspecified diabetic retinopathy without macular edema: Secondary | ICD-10-CM | POA: Diagnosis not present

## 2021-08-20 DIAGNOSIS — Z794 Long term (current) use of insulin: Secondary | ICD-10-CM | POA: Diagnosis not present

## 2021-09-07 DIAGNOSIS — E10319 Type 1 diabetes mellitus with unspecified diabetic retinopathy without macular edema: Secondary | ICD-10-CM | POA: Diagnosis not present

## 2021-09-07 DIAGNOSIS — D692 Other nonthrombocytopenic purpura: Secondary | ICD-10-CM | POA: Diagnosis not present

## 2021-09-07 DIAGNOSIS — K760 Fatty (change of) liver, not elsewhere classified: Secondary | ICD-10-CM | POA: Diagnosis not present

## 2021-09-07 DIAGNOSIS — E10649 Type 1 diabetes mellitus with hypoglycemia without coma: Secondary | ICD-10-CM | POA: Diagnosis not present

## 2021-09-07 DIAGNOSIS — Z4681 Encounter for fitting and adjustment of insulin pump: Secondary | ICD-10-CM | POA: Diagnosis not present

## 2021-09-07 DIAGNOSIS — I110 Hypertensive heart disease with heart failure: Secondary | ICD-10-CM | POA: Diagnosis not present

## 2021-09-07 DIAGNOSIS — I5032 Chronic diastolic (congestive) heart failure: Secondary | ICD-10-CM | POA: Diagnosis not present

## 2021-09-07 DIAGNOSIS — E039 Hypothyroidism, unspecified: Secondary | ICD-10-CM | POA: Diagnosis not present

## 2021-09-07 DIAGNOSIS — E7800 Pure hypercholesterolemia, unspecified: Secondary | ICD-10-CM | POA: Diagnosis not present

## 2021-09-07 DIAGNOSIS — Z794 Long term (current) use of insulin: Secondary | ICD-10-CM | POA: Diagnosis not present

## 2021-09-28 ENCOUNTER — Other Ambulatory Visit: Payer: Self-pay | Admitting: Physician Assistant

## 2021-10-17 DIAGNOSIS — Z794 Long term (current) use of insulin: Secondary | ICD-10-CM | POA: Diagnosis not present

## 2021-10-17 DIAGNOSIS — I110 Hypertensive heart disease with heart failure: Secondary | ICD-10-CM | POA: Diagnosis not present

## 2021-10-17 DIAGNOSIS — E7800 Pure hypercholesterolemia, unspecified: Secondary | ICD-10-CM | POA: Diagnosis not present

## 2021-10-17 DIAGNOSIS — E1065 Type 1 diabetes mellitus with hyperglycemia: Secondary | ICD-10-CM | POA: Diagnosis not present

## 2021-10-17 DIAGNOSIS — Z4681 Encounter for fitting and adjustment of insulin pump: Secondary | ICD-10-CM | POA: Diagnosis not present

## 2021-10-26 NOTE — Progress Notes (Signed)
Cardiology Office Note    Date:  10/30/2021   ID:  Andrew Oconnor, DOB [birth year], MRN [id number]  PCP:  Haywood Pao, MD  Cardiologist:  Dr. Martinique  Chief Complaint  Patient presents with   Congestive Heart Failure    History of Present Illness:  Andrew Oconnor is a 66 y.o. 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  He had a cardiac catheterization on 11/24/2014 which demonstrated normal anatomy, normal LV function, normal EDP. Echocardiogram showed EF 55 to 85%, normal diastolic function.  Heart monitor obtained in May 2016 showed 7 beats run of NSVT.  This is controlled on beta-blocker.  He was seen in 2019 with chest pain. ETT was done and was normal.   On follow up today he is doing well. Notes infrequent SOB. Stays busy. No chest pain. Had surgery for groin abscess in August. Had bilateral carpel tunnel surgery. No swelling. BP is controlled. No palpitations. Generally feels better since he got a new insulin pump.   Past Medical History:  Diagnosis Date   Allergy    Anxiety    Carpal tunnel syndrome    Cataract    CHF (congestive heart failure) (HCC)    Colon polyp    Depression    Diabetes mellitus without complication ([place])    Erectile dysfunction    GERD (gastroesophageal reflux disease)    Hiatal hernia    History of diplopia     third nerve palsy 2013    Hyperlipidemia    Hypertension    Obesity    Sleep apnea    Sleep apnea with use of continuous positive airway pressure (CPAP)    diagnsed AHi of 20 in 2010 , titrated to 13 cm H20.   Thyroid disorder     Past Surgical History:  Procedure Laterality Date   CATARACT EXTRACTION W/PHACO Right 10/09/2015   Procedure: CATARACT EXTRACTION PHACO AND INTRAOCULAR LENS PLACEMENT (IOC);  Surgeon: Williams Che, MD;  Location: [facility];  Service: Ophthalmology;  Laterality: Right;  CDE:2.17   HAND SURGERY Bilateral    secondary to nerve damage   hemorrectomy      LEFT HEART CATHETERIZATION WITH CORONARY ANGIOGRAM N/A 11/24/2014   Procedure: LEFT HEART CATHETERIZATION WITH CORONARY ANGIOGRAM;  Surgeon: Mattis Featherly M Martinique, MD;  Location: [facility] CATH LAB;  Service: Cardiovascular;  Laterality: N/A;   LESION REMOVAL N/A 05/04/2021   Procedure: EXCISION OF CHRONIC SUPRAPUBIC ABSCESS;  Surgeon: Greer Pickerel, MD;  Location: [facility];  Service: General;  Laterality: N/A;    Current Medications: Outpatient Medications Prior to Visit  Medication Sig Dispense Refill   benazepril (LOTENSIN) 10 MG tablet TAKE 1 TABLET BY MOUTH EVERY DAY 90 tablet 3   Blood Glucose Calibration (OT ULTRA/FASTTK CNTRL SOLN) SOLN      hydrochlorothiazide (HYDRODIURIL) 25 MG tablet Take 1/2 tablet as needed for bp > 160 15 tablet 6   insulin lispro (HUMALOG) 100 UNIT/ML cartridge Inject 80 Units into the skin daily.     levothyroxine (SYNTHROID, LEVOTHROID) 150 MCG tablet Take 150 mcg by mouth daily before breakfast.      metoprolol succinate (TOPROL-XL) 25 MG 24 hr tablet TAKE 1 TABLET (25 MG TOTAL) BY MOUTH IN THE MORNING AND AT BEDTIME. 60 tablet 10   omeprazole (PRILOSEC) 40 MG capsule Take 40 mg by mouth daily.  2   pravastatin (PRAVACHOL) 40 MG tablet Take 40 mg by mouth daily.  aspirin chewable tablet 81 mg      No facility-administered medications prior to visit.     Allergies:   Donepezil, Ropinirole, and Ropinirole hcl   Social History   Socioeconomic History   Marital status: Married    Spouse name: Andrew Oconnor   Number of children: 0   Years of education: 12   Highest education level: Not on file  Occupational History   Occupation: retired    Comment: Water quality scientist  Tobacco Use   Smoking status: Former    Types: Cigarettes    Quit date: 09/09/2002    Years since quitting: 19.1   Smokeless tobacco: Never   Tobacco comments:    quit smoking 2004  Vaping Use   Vaping Use: Never used  Substance and Sexual Activity   Alcohol use: Yes     Comment: occas.   Drug use: No   Sexual activity: Not on file  Other Topics Concern   Not on file  Social History Narrative    may continue treatment of complex apnea with his current CPAP setting , good residual AHI of 3.3/   Has improved by [facility] and has only once a night  nocturia , less shortness of breath.   gel mask fits well acc. to air leak data but has left a pressure mark.    He may need an alternative mask until it's healed. I recommended a swift FX medium pillows .   DME [employer] - machine , HCS with Nicole Kindred,  RT.      Patient is married Andrew Oconnor) and lives at home with his wife and step-son.   Patient is retired.   Patient has a high school.   Patient is right-handed.   Patient drinks 1-2 cups of coffee every morning.            Social Determinants of Health   Financial Resource Strain: Not on file  Food Insecurity: Not on file  Transportation Needs: Not on file  Physical Activity: Not on file  Stress: Not on file  Social Connections: Not on file     Family History:  The patient's family history includes Cancer in his sister; Diabetes in his brother and father; Heart disease in his brother, father, maternal grandfather, maternal grandmother, mother, paternal grandfather, paternal grandmother, and sister; Seizures in his father; Stroke in his father and another family member.   ROS:   Please see the history of present illness.    ROS All other systems reviewed and are negative.   PHYSICAL EXAM:   VS:  BP (!) 100/50 (BP Location: Left Arm, Patient Position: Sitting)    Pulse 65    Ht 4\' 7"  (1.397 m)    Wt 150 lb 12.8 oz (68.4 kg)    SpO2 96%    BMI 35.05 kg/m    GEN: Well nourished, well developed, in no acute distress  HEENT: normal  Neck: no JVD, carotid bruits, or masses Cardiac: RRR; no murmurs, rubs, or gallops,no edema  Respiratory:  clear to auscultation bilaterally, normal work of breathing GI: soft, nontender, nondistended, + BS MS: no deformity or  atrophy  Skin: warm and dry, no rash Neuro:  Alert and Oriented x 3, Strength and sensation are intact Psych: euthymic mood, full affect  Wt Readings from Last 3 Encounters:  10/30/21 150 lb 12.8 oz (68.4 kg)  05/04/21 140 lb 6.9 oz (63.7 kg)  09/25/20 147 lb 6.4 oz (66.9 kg)      Studies/Labs Reviewed:  EKG:  EKG is not ordered today.   Recent Labs: 04/30/2021: BUN 13; Creatinine, Ser 0.82; Potassium 4.2; Sodium 136   Lipid Panel No results found for: CHOL, TRIG, HDL, CHOLHDL, VLDL, LDLCALC, LDLDIRECT  Dated 05/31/21: cholesterol 156, triglycerides 122, HDL 45, LDL 87. CBC and CMET normal Dated 09/07/21: A1c 6.2%  Additional studies/ records that were reviewed today include:   Cath 11/23/2016 Procedural Findings: Hemodynamics: AO 156/78 mean 114 mm Hg LV 166/32 mm Hg   Coronary angiography: Coronary dominance: left   Left mainstem:    Left anterior descending (LAD): Normal    Left circumflex (LCx): Normal   Right coronary artery (RCA): Normal   Left ventriculography: Left ventricular systolic function is normal, LVEF is estimated at 55-65%, there is no significant mitral regurgitation. The proximal aorta is mildly dilated.    Final Conclusions:   1. Normal coronary anatomy. 2. Normal LV function 3. Elevated LV EDP   Echo 11/25/2014 LV EF: 55% -   60% Study Conclusions  - Left ventricle: The cavity size was normal. Systolic function was   normal. The estimated ejection fraction was in the range of 55%   to 60%. Wall motion was normal; there were no regional wall   motion abnormalities. Left ventricular diastolic function   parameters were normal. - Left atrium: The atrium was mildly dilated. - Atrial septum: No defect or patent foramen ovale was identified. - Pericardium, extracardiac: A trivial pericardial effusion was   identified posterior to the heart.   Event monitor 01/18/2015 Study Highlights   NSR and sinus tachycardia 7 beat run of  NSVT Symptoms of skipped beats and tachycardia do not correlate with arrythmia.   1. Normal sinus rhythm 2. 7 beat run NSVT 3. Symptoms do not correlate with arrhythmia.   ETT 07/14/18: Stress Findings  Stress Findings The patient exercised following the Bruce protocol.   The patient reported shortness of breath during the stress test. The patient experienced no angina during the stress test.   The test was stopped because  the patient complained of fatigue and shortness of breath.   Heart rate demonstrated a normal response to exercise. Blood pressure demonstrated a hypertensive response to exercise. Overall, the patient's exercise capacity was normal.   85% of maximum heart rate was achieved after 6.5 minutes.  Recovery time:  5 minutes.   Duke Treadmill Score: low risk The patient's response to exercise was adequate for diagnosis.   ASSESSMENT:    1. NSVT (nonsustained ventricular tachycardia)   2. Primary hypertension   3. Hyperlipidemia LDL goal <70   4. Chronic diastolic heart failure (HCC)      PLAN:  In order of problems listed above:  Atypical chest pain: Previous cardiac catheterization 2016 showed normal coronary arteries.  ETT in 2019 was normal.   Palpitation: minor arrhythmia on monitor. Well controlled on Toprol.  Hypertension: well controlled.  Hyperlipidemia: On pravastatin 40 mg daily.labs per PCP.  Hypothyroidism: Managed by primary care provider  Chronic diastolic heart failure: Euvolemic on physical exam.     Medication Adjustments/Labs and Tests Ordered: Current medicines are reviewed at length with the patient today.  Concerns regarding medicines are outlined above.  Medication changes, Labs and Tests ordered today are listed in the Patient Instructions below. There are no Patient Instructions on file for this visit.    Signed, Shakerra Red Martinique, MD  10/30/2021 1:32 PM    [facility] [place], [place], [place]   [address]  Phone: [phone]; Fax: [phone]

## 2021-10-30 ENCOUNTER — Encounter: Payer: Self-pay | Admitting: Cardiology

## 2021-10-30 ENCOUNTER — Other Ambulatory Visit: Payer: Self-pay

## 2021-10-30 ENCOUNTER — Ambulatory Visit: Payer: Medicare PPO | Admitting: Cardiology

## 2021-10-30 VITALS — BP 100/50 | HR 65 | Ht <= 58 in | Wt 150.8 lb

## 2021-10-30 DIAGNOSIS — E785 Hyperlipidemia, unspecified: Secondary | ICD-10-CM | POA: Diagnosis not present

## 2021-10-30 DIAGNOSIS — I5032 Chronic diastolic (congestive) heart failure: Secondary | ICD-10-CM | POA: Diagnosis not present

## 2021-10-30 DIAGNOSIS — E1040 Type 1 diabetes mellitus with diabetic neuropathy, unspecified: Secondary | ICD-10-CM | POA: Diagnosis not present

## 2021-10-30 DIAGNOSIS — I10 Essential (primary) hypertension: Secondary | ICD-10-CM | POA: Diagnosis not present

## 2021-10-30 DIAGNOSIS — E1065 Type 1 diabetes mellitus with hyperglycemia: Secondary | ICD-10-CM | POA: Diagnosis not present

## 2021-10-30 DIAGNOSIS — I4729 Other ventricular tachycardia: Secondary | ICD-10-CM | POA: Diagnosis not present

## 2021-10-30 MED ORDER — ASPIRIN EC 81 MG PO TBEC: 81.0000 mg | DELAYED_RELEASE_TABLET | Freq: Every day | ORAL | 3 refills | Status: AC

## 2021-12-03 DIAGNOSIS — E7800 Pure hypercholesterolemia, unspecified: Secondary | ICD-10-CM | POA: Diagnosis not present

## 2021-12-03 DIAGNOSIS — Z4681 Encounter for fitting and adjustment of insulin pump: Secondary | ICD-10-CM | POA: Diagnosis not present

## 2021-12-03 DIAGNOSIS — E1065 Type 1 diabetes mellitus with hyperglycemia: Secondary | ICD-10-CM | POA: Diagnosis not present

## 2021-12-03 DIAGNOSIS — D692 Other nonthrombocytopenic purpura: Secondary | ICD-10-CM | POA: Diagnosis not present

## 2021-12-03 DIAGNOSIS — I110 Hypertensive heart disease with heart failure: Secondary | ICD-10-CM | POA: Diagnosis not present

## 2021-12-03 DIAGNOSIS — K760 Fatty (change of) liver, not elsewhere classified: Secondary | ICD-10-CM | POA: Diagnosis not present

## 2021-12-03 DIAGNOSIS — E039 Hypothyroidism, unspecified: Secondary | ICD-10-CM | POA: Diagnosis not present

## 2021-12-03 DIAGNOSIS — E10649 Type 1 diabetes mellitus with hypoglycemia without coma: Secondary | ICD-10-CM | POA: Diagnosis not present

## 2021-12-03 DIAGNOSIS — I5032 Chronic diastolic (congestive) heart failure: Secondary | ICD-10-CM | POA: Diagnosis not present

## 2022-01-21 DIAGNOSIS — E1065 Type 1 diabetes mellitus with hyperglycemia: Secondary | ICD-10-CM | POA: Diagnosis not present

## 2022-01-21 DIAGNOSIS — E1040 Type 1 diabetes mellitus with diabetic neuropathy, unspecified: Secondary | ICD-10-CM | POA: Diagnosis not present

## 2022-02-04 ENCOUNTER — Other Ambulatory Visit: Payer: Self-pay | Admitting: Cardiology

## 2022-02-12 DIAGNOSIS — Z794 Long term (current) use of insulin: Secondary | ICD-10-CM | POA: Diagnosis not present

## 2022-02-12 DIAGNOSIS — E7800 Pure hypercholesterolemia, unspecified: Secondary | ICD-10-CM | POA: Diagnosis not present

## 2022-02-12 DIAGNOSIS — E10319 Type 1 diabetes mellitus with unspecified diabetic retinopathy without macular edema: Secondary | ICD-10-CM | POA: Diagnosis not present

## 2022-02-12 DIAGNOSIS — I110 Hypertensive heart disease with heart failure: Secondary | ICD-10-CM | POA: Diagnosis not present

## 2022-02-12 DIAGNOSIS — E1040 Type 1 diabetes mellitus with diabetic neuropathy, unspecified: Secondary | ICD-10-CM | POA: Diagnosis not present

## 2022-02-12 DIAGNOSIS — Z4681 Encounter for fitting and adjustment of insulin pump: Secondary | ICD-10-CM | POA: Diagnosis not present

## 2022-03-18 DIAGNOSIS — E10319 Type 1 diabetes mellitus with unspecified diabetic retinopathy without macular edema: Secondary | ICD-10-CM | POA: Diagnosis not present

## 2022-03-18 DIAGNOSIS — Z4681 Encounter for fitting and adjustment of insulin pump: Secondary | ICD-10-CM | POA: Diagnosis not present

## 2022-03-18 DIAGNOSIS — I5032 Chronic diastolic (congestive) heart failure: Secondary | ICD-10-CM | POA: Diagnosis not present

## 2022-03-18 DIAGNOSIS — E039 Hypothyroidism, unspecified: Secondary | ICD-10-CM | POA: Diagnosis not present

## 2022-03-18 DIAGNOSIS — E1040 Type 1 diabetes mellitus with diabetic neuropathy, unspecified: Secondary | ICD-10-CM | POA: Diagnosis not present

## 2022-03-18 DIAGNOSIS — E7800 Pure hypercholesterolemia, unspecified: Secondary | ICD-10-CM | POA: Diagnosis not present

## 2022-03-18 DIAGNOSIS — Z794 Long term (current) use of insulin: Secondary | ICD-10-CM | POA: Diagnosis not present

## 2022-03-18 DIAGNOSIS — I110 Hypertensive heart disease with heart failure: Secondary | ICD-10-CM | POA: Diagnosis not present

## 2022-03-18 DIAGNOSIS — I209 Angina pectoris, unspecified: Secondary | ICD-10-CM | POA: Diagnosis not present

## 2022-04-11 DIAGNOSIS — E1040 Type 1 diabetes mellitus with diabetic neuropathy, unspecified: Secondary | ICD-10-CM | POA: Diagnosis not present

## 2022-04-11 DIAGNOSIS — E1065 Type 1 diabetes mellitus with hyperglycemia: Secondary | ICD-10-CM | POA: Diagnosis not present

## 2022-06-14 DIAGNOSIS — E119 Type 2 diabetes mellitus without complications: Secondary | ICD-10-CM | POA: Diagnosis not present

## 2022-06-14 DIAGNOSIS — H524 Presbyopia: Secondary | ICD-10-CM | POA: Diagnosis not present

## 2022-06-24 DIAGNOSIS — E039 Hypothyroidism, unspecified: Secondary | ICD-10-CM | POA: Diagnosis not present

## 2022-06-24 DIAGNOSIS — Z79899 Other long term (current) drug therapy: Secondary | ICD-10-CM | POA: Diagnosis not present

## 2022-06-24 DIAGNOSIS — E109 Type 1 diabetes mellitus without complications: Secondary | ICD-10-CM | POA: Diagnosis not present

## 2022-06-24 DIAGNOSIS — Z9641 Presence of insulin pump (external) (internal): Secondary | ICD-10-CM | POA: Diagnosis not present

## 2022-06-24 DIAGNOSIS — Z79890 Hormone replacement therapy (postmenopausal): Secondary | ICD-10-CM | POA: Diagnosis not present

## 2022-06-24 DIAGNOSIS — T7840XA Allergy, unspecified, initial encounter: Secondary | ICD-10-CM | POA: Diagnosis not present

## 2022-07-01 DIAGNOSIS — E1065 Type 1 diabetes mellitus with hyperglycemia: Secondary | ICD-10-CM | POA: Diagnosis not present

## 2022-07-01 DIAGNOSIS — E1040 Type 1 diabetes mellitus with diabetic neuropathy, unspecified: Secondary | ICD-10-CM | POA: Diagnosis not present

## 2022-08-14 DIAGNOSIS — G4733 Obstructive sleep apnea (adult) (pediatric): Secondary | ICD-10-CM | POA: Diagnosis not present

## 2022-08-14 DIAGNOSIS — G2581 Restless legs syndrome: Secondary | ICD-10-CM | POA: Diagnosis not present

## 2022-08-19 DIAGNOSIS — G4733 Obstructive sleep apnea (adult) (pediatric): Secondary | ICD-10-CM | POA: Diagnosis not present

## 2022-08-26 DIAGNOSIS — Z23 Encounter for immunization: Secondary | ICD-10-CM | POA: Diagnosis not present

## 2022-08-26 DIAGNOSIS — I200 Unstable angina: Secondary | ICD-10-CM | POA: Diagnosis not present

## 2022-08-26 DIAGNOSIS — Z6832 Body mass index (BMI) 32.0-32.9, adult: Secondary | ICD-10-CM | POA: Diagnosis not present

## 2022-08-26 DIAGNOSIS — I4729 Other ventricular tachycardia: Secondary | ICD-10-CM | POA: Diagnosis not present

## 2022-08-26 DIAGNOSIS — E10319 Type 1 diabetes mellitus with unspecified diabetic retinopathy without macular edema: Secondary | ICD-10-CM | POA: Diagnosis not present

## 2022-08-26 DIAGNOSIS — I5032 Chronic diastolic (congestive) heart failure: Secondary | ICD-10-CM | POA: Diagnosis not present

## 2022-08-26 DIAGNOSIS — I110 Hypertensive heart disease with heart failure: Secondary | ICD-10-CM | POA: Diagnosis not present

## 2022-08-26 DIAGNOSIS — E6609 Other obesity due to excess calories: Secondary | ICD-10-CM | POA: Diagnosis not present

## 2022-08-26 DIAGNOSIS — E039 Hypothyroidism, unspecified: Secondary | ICD-10-CM | POA: Diagnosis not present

## 2022-09-18 ENCOUNTER — Telehealth: Payer: Self-pay | Admitting: Cardiology

## 2022-09-18 DIAGNOSIS — G2581 Restless legs syndrome: Secondary | ICD-10-CM | POA: Diagnosis not present

## 2022-09-18 DIAGNOSIS — G4733 Obstructive sleep apnea (adult) (pediatric): Secondary | ICD-10-CM | POA: Diagnosis not present

## 2022-09-18 NOTE — Telephone Encounter (Signed)
New Message:     Patient's wife called. She said the sclaes they eceived from Dr Martinique was ruined by water. She said they had a water leak at his home and the sclaes will no longer work/ She wants toknow if you could help him get another one please?i

## 2022-09-18 NOTE — Telephone Encounter (Signed)
Spoke to patient new scales left at [employer] office front desk.Stated he will come tomorrow to pick up.

## 2022-09-19 DIAGNOSIS — Z1159 Encounter for screening for other viral diseases: Secondary | ICD-10-CM | POA: Diagnosis not present

## 2022-09-19 DIAGNOSIS — I10 Essential (primary) hypertension: Secondary | ICD-10-CM | POA: Diagnosis not present

## 2022-09-19 DIAGNOSIS — E10319 Type 1 diabetes mellitus with unspecified diabetic retinopathy without macular edema: Secondary | ICD-10-CM | POA: Diagnosis not present

## 2022-09-19 DIAGNOSIS — Z5181 Encounter for therapeutic drug level monitoring: Secondary | ICD-10-CM | POA: Diagnosis not present

## 2022-09-19 DIAGNOSIS — I200 Unstable angina: Secondary | ICD-10-CM | POA: Diagnosis not present

## 2022-09-19 DIAGNOSIS — I110 Hypertensive heart disease with heart failure: Secondary | ICD-10-CM | POA: Diagnosis not present

## 2022-09-19 DIAGNOSIS — E039 Hypothyroidism, unspecified: Secondary | ICD-10-CM | POA: Diagnosis not present

## 2022-09-19 DIAGNOSIS — I4729 Other ventricular tachycardia: Secondary | ICD-10-CM | POA: Diagnosis not present

## 2022-09-19 DIAGNOSIS — G2581 Restless legs syndrome: Secondary | ICD-10-CM | POA: Diagnosis not present

## 2022-09-19 DIAGNOSIS — I5032 Chronic diastolic (congestive) heart failure: Secondary | ICD-10-CM | POA: Diagnosis not present

## 2022-09-19 DIAGNOSIS — Z79899 Other long term (current) drug therapy: Secondary | ICD-10-CM | POA: Diagnosis not present

## 2022-09-19 DIAGNOSIS — E785 Hyperlipidemia, unspecified: Secondary | ICD-10-CM | POA: Diagnosis not present

## 2022-09-19 DIAGNOSIS — Z0000 Encounter for general adult medical examination without abnormal findings: Secondary | ICD-10-CM | POA: Diagnosis not present

## 2022-09-25 ENCOUNTER — Emergency Department (HOSPITAL_COMMUNITY)
Admission: EM | Admit: 2022-09-25 | Discharge: 2022-09-26 | Disposition: A | Discharge status: Home / Self Care | Payer: Medicare PPO | Attending: Emergency Medicine | Admitting: Emergency Medicine

## 2022-09-25 ENCOUNTER — Other Ambulatory Visit: Payer: Self-pay

## 2022-09-25 DIAGNOSIS — F10129 Alcohol abuse with intoxication, unspecified: Secondary | ICD-10-CM | POA: Diagnosis present

## 2022-09-25 DIAGNOSIS — Z794 Long term (current) use of insulin: Secondary | ICD-10-CM | POA: Insufficient documentation

## 2022-09-25 DIAGNOSIS — E039 Hypothyroidism, unspecified: Secondary | ICD-10-CM | POA: Diagnosis not present

## 2022-09-25 DIAGNOSIS — I10 Essential (primary) hypertension: Secondary | ICD-10-CM | POA: Diagnosis not present

## 2022-09-25 DIAGNOSIS — Z1152 Encounter for screening for COVID-19: Secondary | ICD-10-CM | POA: Insufficient documentation

## 2022-09-25 DIAGNOSIS — E119 Type 2 diabetes mellitus without complications: Secondary | ICD-10-CM | POA: Insufficient documentation

## 2022-09-25 DIAGNOSIS — Y902 Blood alcohol level of 40-59 mg/100 ml: Secondary | ICD-10-CM | POA: Insufficient documentation

## 2022-09-25 DIAGNOSIS — Z79890 Hormone replacement therapy (postmenopausal): Secondary | ICD-10-CM | POA: Insufficient documentation

## 2022-09-25 DIAGNOSIS — Z87891 Personal history of nicotine dependence: Secondary | ICD-10-CM | POA: Insufficient documentation

## 2022-09-25 DIAGNOSIS — T424X2A Poisoning by benzodiazepines, intentional self-harm, initial encounter: Secondary | ICD-10-CM | POA: Diagnosis not present

## 2022-09-25 DIAGNOSIS — I110 Hypertensive heart disease with heart failure: Secondary | ICD-10-CM | POA: Insufficient documentation

## 2022-09-25 DIAGNOSIS — I5032 Chronic diastolic (congestive) heart failure: Secondary | ICD-10-CM | POA: Insufficient documentation

## 2022-09-25 DIAGNOSIS — R4182 Altered mental status, unspecified: Secondary | ICD-10-CM | POA: Diagnosis not present

## 2022-09-25 DIAGNOSIS — R45851 Suicidal ideations: Secondary | ICD-10-CM | POA: Diagnosis not present

## 2022-09-25 DIAGNOSIS — F1090 Alcohol use, unspecified, uncomplicated: Secondary | ICD-10-CM | POA: Diagnosis not present

## 2022-09-25 DIAGNOSIS — Z7982 Long term (current) use of aspirin: Secondary | ICD-10-CM | POA: Insufficient documentation

## 2022-09-25 DIAGNOSIS — F10929 Alcohol use, unspecified with intoxication, unspecified: Secondary | ICD-10-CM | POA: Diagnosis not present

## 2022-09-25 DIAGNOSIS — Z79899 Other long term (current) drug therapy: Secondary | ICD-10-CM | POA: Diagnosis not present

## 2022-09-25 DIAGNOSIS — R410 Disorientation, unspecified: Secondary | ICD-10-CM | POA: Diagnosis not present

## 2022-09-25 LAB — CBC WITH DIFFERENTIAL/PLATELET
Abs Immature Granulocytes: 0.02 10*3/uL (ref 0.00–0.07)
Basophils Absolute: 0 10*3/uL (ref 0.0–0.1)
Basophils Relative: 1 %
Eosinophils Absolute: 0.5 10*3/uL (ref 0.0–0.5)
Eosinophils Relative: 8 %
HCT: 46.1 % (ref 39.0–52.0)
Hemoglobin: 16.1 g/dL (ref 13.0–17.0)
Immature Granulocytes: 0 %
Lymphocytes Relative: 31 %
Lymphs Abs: 1.9 10*3/uL (ref 0.7–4.0)
MCH: 30.8 pg (ref 26.0–34.0)
MCHC: 34.9 g/dL (ref 30.0–36.0)
MCV: 88.3 fL (ref 80.0–100.0)
Monocytes Absolute: 0.5 10*3/uL (ref 0.1–1.0)
Monocytes Relative: 8 %
Neutro Abs: 3.2 10*3/uL (ref 1.7–7.7)
Neutrophils Relative %: 52 %
Platelets: 218 10*3/uL (ref 150–400)
RBC: 5.22 MIL/uL (ref 4.22–5.81)
RDW: 13 % (ref 11.5–15.5)
WBC: 6.2 10*3/uL (ref 4.0–10.5)
nRBC: 0 % (ref 0.0–0.2)

## 2022-09-25 LAB — RESP PANEL BY RT-PCR (RSV, FLU A&B, COVID)  RVPGX2
Influenza A by PCR: NEGATIVE
Influenza B by PCR: NEGATIVE
Resp Syncytial Virus by PCR: NEGATIVE
SARS Coronavirus 2 by RT PCR: NEGATIVE

## 2022-09-25 LAB — CBG MONITORING, ED
Glucose-Capillary: 112 mg/dL — ABNORMAL HIGH (ref 70–99)
Glucose-Capillary: 189 mg/dL — ABNORMAL HIGH (ref 70–99)
Glucose-Capillary: 129 mg/dL — ABNORMAL HIGH (ref 70–99)

## 2022-09-25 LAB — COMPREHENSIVE METABOLIC PANEL
ALT: 28 U/L (ref 0–44)
AST: 27 U/L (ref 15–41)
Albumin: 3.8 g/dL (ref 3.5–5.0)
Alkaline Phosphatase: 105 U/L (ref 38–126)
Anion gap: 10 (ref 5–15)
BUN: 9 mg/dL (ref 8–23)
CO2: 24 mmol/L (ref 22–32)
Calcium: 8.8 mg/dL — ABNORMAL LOW (ref 8.9–10.3)
Chloride: 103 mmol/L (ref 98–111)
Creatinine, Ser: 0.81 mg/dL (ref 0.61–1.24)
GFR, Estimated: >60 mL/min (ref >60)
Glucose, Bld: 100 mg/dL — ABNORMAL HIGH (ref 70–99)
Potassium: 2.8 mmol/L — ABNORMAL LOW (ref 3.5–5.1)
Sodium: 137 mmol/L (ref 135–145)
Total Bilirubin: 0.9 mg/dL (ref 0.3–1.2)
Total Protein: 7.2 g/dL (ref 6.5–8.1)

## 2022-09-25 LAB — RAPID URINE DRUG SCREEN, HOSP PERFORMED
Amphetamines: NOT DETECTED
Barbiturates: NOT DETECTED
Benzodiazepines: POSITIVE — AB
Cocaine: NOT DETECTED
Opiates: NOT DETECTED
Tetrahydrocannabinol: NOT DETECTED

## 2022-09-25 LAB — POTASSIUM: Potassium: 3.7 mmol/L (ref 3.5–5.1)

## 2022-09-25 LAB — HEMOGLOBIN A1C
Hgb A1c MFr Bld: 5.8 % — ABNORMAL HIGH (ref 4.8–5.6)
Mean Plasma Glucose: 119.76 mg/dL

## 2022-09-25 LAB — ETHANOL: Alcohol, Ethyl (B): 48 mg/dL — ABNORMAL HIGH (ref <10)

## 2022-09-25 MED ORDER — METOPROLOL SUCCINATE ER 25 MG PO TB24: 25.0000 mg | ORAL_TABLET | Freq: Every day | ORAL | Status: DC

## 2022-09-25 MED ORDER — METOPROLOL SUCCINATE ER 25 MG PO TB24
25.0000 mg | ORAL_TABLET | Freq: Every day | ORAL | Status: DC
  Administered 2022-09-26: 25 mg via ORAL
  Filled 2022-09-25 (×2): qty 1

## 2022-09-25 MED ORDER — PRAVASTATIN SODIUM 40 MG PO TABS: 40.0000 mg | ORAL_TABLET | Freq: Every day | ORAL | Status: DC

## 2022-09-25 MED ORDER — POTASSIUM CHLORIDE 10 MEQ/100ML IV SOLN
10.0000 meq | INTRAVENOUS | Status: AC
  Administered 2022-09-25 (×3): 10 meq via INTRAVENOUS
  Filled 2022-09-25 (×3): qty 100

## 2022-09-25 MED ORDER — BENAZEPRIL HCL 5 MG PO TABS
10.0000 mg | ORAL_TABLET | Freq: Every day | ORAL | Status: DC
  Administered 2022-09-26: 10 mg via ORAL
  Filled 2022-09-25 (×2): qty 2

## 2022-09-25 MED ORDER — LEVOTHYROXINE SODIUM 50 MCG PO TABS
150.0000 ug | ORAL_TABLET | Freq: Every day | ORAL | Status: DC
  Administered 2022-09-26: 150 ug via ORAL
  Filled 2022-09-25: qty 3

## 2022-09-25 MED ORDER — INSULIN GLARGINE-YFGN 100 UNIT/ML ~~LOC~~ SOLN
15.0000 [IU] | SUBCUTANEOUS | Status: DC
  Administered 2022-09-25: 15 [IU] via SUBCUTANEOUS
  Filled 2022-09-25 (×2): qty 0.15

## 2022-09-25 MED ORDER — PANTOPRAZOLE SODIUM 40 MG PO TBEC: 40.0000 mg | DELAYED_RELEASE_TABLET | Freq: Every day | ORAL | Status: DC

## 2022-09-25 MED ORDER — PRAVASTATIN SODIUM 40 MG PO TABS
40.0000 mg | ORAL_TABLET | Freq: Every day | ORAL | Status: DC
  Administered 2022-09-26: 40 mg via ORAL
  Filled 2022-09-25 (×2): qty 1

## 2022-09-25 MED ORDER — INSULIN ASPART 100 UNIT/ML IJ SOLN: 0.0000 [IU] | Freq: Every day | INTRAMUSCULAR | Status: DC

## 2022-09-25 MED ORDER — POTASSIUM CHLORIDE CRYS ER 20 MEQ PO TBCR: 40.0000 meq | EXTENDED_RELEASE_TABLET | Freq: Once | ORAL | Status: DC

## 2022-09-25 MED ORDER — SODIUM CHLORIDE 0.9 % IV BOLUS
1000.0000 mL | Freq: Once | INTRAVENOUS | Status: AC
  Administered 2022-09-25: 1000 mL via INTRAVENOUS

## 2022-09-25 MED ORDER — INSULIN ASPART 100 UNIT/ML IJ SOLN
0.0000 [IU] | Freq: Three times a day (TID) | INTRAMUSCULAR | Status: DC
  Administered 2022-09-25: 4 [IU] via SUBCUTANEOUS
  Administered 2022-09-26: 20 [IU] via SUBCUTANEOUS
  Administered 2022-09-26: 15 [IU] via SUBCUTANEOUS
  Filled 2022-09-25 (×3): qty 1

## 2022-09-25 MED ORDER — PANTOPRAZOLE SODIUM 40 MG PO TBEC
40.0000 mg | DELAYED_RELEASE_TABLET | Freq: Every day | ORAL | Status: DC
  Administered 2022-09-26: 40 mg via ORAL
  Filled 2022-09-25 (×2): qty 1

## 2022-09-25 MED ORDER — ASPIRIN 81 MG PO TBEC
81.0000 mg | DELAYED_RELEASE_TABLET | Freq: Every day | ORAL | Status: DC
  Administered 2022-09-26: 81 mg via ORAL
  Filled 2022-09-25 (×2): qty 1

## 2022-09-25 MED ORDER — ASPIRIN 81 MG PO TBEC: 81.0000 mg | DELAYED_RELEASE_TABLET | Freq: Every day | ORAL | Status: DC

## 2022-09-25 MED ORDER — INSULIN GLARGINE-YFGN 100 UNIT/ML ~~LOC~~ SOLN
15.0000 [IU] | Freq: Every day | SUBCUTANEOUS | Status: DC
  Filled 2022-09-25: qty 0.15

## 2022-09-25 MED ORDER — BENAZEPRIL HCL 5 MG PO TABS: 10.0000 mg | ORAL_TABLET | Freq: Every day | ORAL | Status: DC

## 2022-09-25 NOTE — ED Notes (Addendum)
Holding PO K+ due to lack of alertness. MD notified.

## 2022-09-25 NOTE — ED Notes (Signed)
Pump removed and given to wife

## 2022-09-25 NOTE — BH Assessment (Signed)
@  1758, requesting patient's nurse Caryl Pina, RN) to set up the TTS machine for patient's initial assessment.

## 2022-09-25 NOTE — ED Notes (Addendum)
Pt unable to take medications due to low alertness. MD notified

## 2022-09-25 NOTE — ED Notes (Signed)
Kentrell Hallahan brother    [phone]  call if you need him

## 2022-09-25 NOTE — ED Notes (Signed)
Respiratory at bedside with pt and putting pt on a bipap for pt frequent apneic periods. MD notified

## 2022-09-25 NOTE — BH Assessment (Signed)
@  1807, Clinician requested patient's nurse Caryl Pina, RN) to set up the teleassment machine for patient's initial TTS assessment. She reported a response stating, "He is on bipap and very sleepy". Therefore, this Clinician requested that patient's nurse contact TTS when is able to participate in the TTS assessment.

## 2022-09-25 NOTE — BH Assessment (Addendum)
@  1810, Clinician attempted to complete patient's TTS assessment. The TTS machine was placed in patient's room, his nurse remained at bedside to assist with communication,  patient was groggy, and appeared sleepy.   He initially had his BiPAP on, as it was difficult to hear his responses to questions. Clinician shared this concern with his nurse. She removed the BiPAP.   He answered some questions appropriately. Other questions he did not answer appropriately, some responses were conflicting, and at times he was difficult to understand him.   Clinician informed his nurse of the observation and concerns with completing his TTS assessment at this time. Also, confirmed that patient is not medically cleared, "He is on BIPAP".   Requested his nurse to notify TTS when he is more alert and able to answer questions appropriately. His nurse agreed to pass this information along in report.

## 2022-09-25 NOTE — BH Assessment (Signed)
@  1710, patient's nurse has placed the TTS machine in patient's room. States that patient is oriented. Clinician will attempt to assess patient.

## 2022-09-25 NOTE — ED Triage Notes (Signed)
Pt arrived [facility] for possible overdose of clonazepam 0.5 mg and drinking a 5th of vodka. Wife called EMS when she noticed the whole bottle of clonazepam that was filled on 09/18/22 was empty and entire bottle of vodka was empty. Pt ambulated to stretcher per EMS. Pt is lethargic but answers questions. EDP assessed upon arrival.

## 2022-09-25 NOTE — ED Notes (Signed)
During assessment, pt verbalized to RN that he did make an attempt at suicide. He stated that he is tired of fighting to just get the resources he needs to manage his T1D. Pt stated he does not remember how much he took or how much he drank. After, further conversation, pt stated that he made an attempt because he thought it may get the attention of his medical team and he would get better diabetes management. RN notified charge and MD.

## 2022-09-25 NOTE — ED Provider Notes (Signed)
[facility] EMERGENCY DEPARTMENT Provider Note  CSN: [id number] Arrival date & time: 09/25/22 0800  Chief Complaint(s) Drug Overdose  HPI Andrew Oconnor is a 67 y.o. male with history of diabetes, hypertension, hyperlipidemia, CHF presenting to the emergency department with suspected overdose.  EMS reports that wife called paramedics as he was very confused.  He occasionally goes over to his neighbor's house and gets drunk, reportedly did this last night.  He was prescribed 30 0.5 clonazepam January 10th and per EMS this bottle was empty.  Patient intoxicated, states he took "5, 10. 20" these last night.  He also endorses alcohol use.  He reports that he was "trying to have a good time".  He denies any suicidal ideation.  History limited due to acute intoxication and somnolence but denies any complaints.   Past Medical History Past Medical History:  Diagnosis Date   Allergy    Anxiety    Carpal tunnel syndrome    Cataract    CHF (congestive heart failure) (HCC)    Colon polyp    Depression    Diabetes mellitus without complication ([place])    Erectile dysfunction    GERD (gastroesophageal reflux disease)    Hiatal hernia    History of diplopia     third nerve palsy 2013    Hyperlipidemia    Hypertension    Obesity    Sleep apnea    Sleep apnea with use of continuous positive airway pressure (CPAP)    diagnsed AHi of 20 in 2010 , titrated to 13 cm H20.   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  Authorizing Provider  aspirin EC 81 MG tablet Take 1 tablet (81 mg total) by mouth daily. Swallow whole. 10/30/21   Martinique, Peter M, MD  benazepril (LOTENSIN) 10 MG tablet TAKE 1 TABLET BY MOUTH EVERY DAY 09/28/21   Martinique, Peter M, MD  Blood Glucose Calibration (OT ULTRA/FASTTK CNTRL SOLN) SOLN  03/16/14   [provider]  hydrochlorothiazide (HYDRODIURIL) 25 MG tablet Take 1/2 tablet as needed for bp > 160 08/16/16   Barrett, Rhonda G, PA-C  insulin lispro (HUMALOG) 100 UNIT/ML cartridge Inject 80 Units into the skin daily.    [provider]  levothyroxine (SYNTHROID, LEVOTHROID) 150 MCG tablet Take 150 mcg by mouth daily before breakfast.  08/02/15   [provider]  metoprolol succinate (TOPROL-XL) 25 MG 24 hr tablet TAKE 1 TABLET (25 MG TOTAL) BY MOUTH IN THE MORNING AND AT BEDTIME. 02/06/22   Martinique, Peter M, MD  omeprazole (PRILOSEC) 40 MG capsule Take 40 mg by mouth daily. 07/26/16   [provider]  pravastatin (PRAVACHOL) 40 MG tablet Take 40 mg by mouth daily.  01/16/13   [provider]  Past Surgical History Past Surgical History:  Procedure Laterality Date   CATARACT EXTRACTION W/PHACO Right 10/09/2015   Procedure: CATARACT EXTRACTION PHACO AND INTRAOCULAR LENS PLACEMENT (IOC);  Surgeon: Williams Che, MD;  Location: [facility];  Service: Ophthalmology;  Laterality: Right;  CDE:2.17   HAND SURGERY Bilateral    secondary to nerve damage   hemorrectomy     LEFT HEART CATHETERIZATION WITH CORONARY ANGIOGRAM N/A 11/24/2014   Procedure: LEFT HEART CATHETERIZATION WITH CORONARY ANGIOGRAM;  Surgeon: Peter M Martinique, MD;  Location: [facility] CATH LAB;  Service: Cardiovascular;  Laterality: N/A;   LESION REMOVAL N/A 05/04/2021   Procedure: EXCISION OF CHRONIC SUPRAPUBIC ABSCESS;  Surgeon: Greer Pickerel, MD;  Location: [facility];  Service: General;  Laterality: N/A;   Family History Family History  Problem Relation Age of Onset   Heart disease Mother    Heart disease Father    Diabetes Father    Stroke Father    Seizures Father    Stroke Other    Cancer Sister    Heart disease Sister    Heart disease Brother    Diabetes Brother    Heart disease Maternal Grandmother    Heart disease Maternal Grandfather    Heart disease Paternal Grandmother    Heart disease Paternal Grandfather    Colon cancer Neg Hx    Esophageal cancer Neg Hx    Rectal cancer Neg Hx    Stomach cancer Neg Hx     Social History Social History   Tobacco Use   Smoking status: Former    Types: Cigarettes    Quit date: 09/09/2002    Years since quitting: 20.0   Smokeless tobacco: Never   Tobacco comments:    quit smoking 2004  Vaping Use   Vaping Use: Never used  Substance Use Topics   Alcohol use: Yes    Comment: occas.   Drug use: No   Allergies Donepezil, Gabapentin, Ropinirole, and Ropinirole hcl  Review of Systems Review of Systems  Unable to perform ROS: Mental status change    Physical Exam Vital Signs  I have reviewed the triage vital signs BP 103/63   Pulse 68   Temp (!) 96.6 F (35.9 C) (Rectal)   Resp 14   Ht '4\' 7"'$  (1.397 m)   Wt 66.7 kg   SpO2 99%   BMI 34.17 kg/m  Physical Exam Vitals and nursing note reviewed.  Constitutional:      Appearance: Normal appearance.     Comments: Somnolent   HENT:     Head: Normocephalic and atraumatic.     Mouth/Throat:     Mouth: Mucous membranes are moist.  Eyes:     Conjunctiva/sclera: Conjunctivae normal.     Pupils: Pupils are equal, round, and reactive to light.  Cardiovascular:     Rate and Rhythm: Normal rate and regular rhythm.  Pulmonary:     Effort: Pulmonary effort is normal. No respiratory distress.     Breath sounds: Normal breath sounds.  Abdominal:     General: Abdomen is flat.     Palpations: Abdomen is soft.     Tenderness:  There is no abdominal tenderness.  Musculoskeletal:     Right lower leg: No edema.     Left lower leg: No edema.  Skin:    General: Skin is warm and dry.     Capillary Refill: Capillary refill takes less than 2 seconds.  Neurological:     Comments: Oriented to self,  not to place or time.  Falls asleep very quickly but arousable.  Moves all 4 extremities spontaneously.  No obvious cranial nerve deficit.  Psychiatric:     Comments: Denies suicidal ideation or intent     ED Results and Treatments Labs (all labs ordered are listed, but only abnormal results are displayed) Labs Reviewed  COMPREHENSIVE METABOLIC PANEL - Abnormal; Notable for the following components:      Result Value   Potassium 2.8 (*)    Glucose, Bld 100 (*)    Calcium 8.8 (*)    All other components within normal limits  ETHANOL - Abnormal; Notable for the following components:   Alcohol, Ethyl (B) 48 (*)    All other components within normal limits  RAPID URINE DRUG SCREEN, HOSP PERFORMED - Abnormal; Notable for the following components:   Benzodiazepines POSITIVE (*)    All other components within normal limits  CBG MONITORING, ED - Abnormal; Notable for the following components:   Glucose-Capillary 112 (*)    All other components within normal limits  RESP PANEL BY RT-PCR (RSV, FLU A&B, COVID)  RVPGX2  CBC WITH DIFFERENTIAL/PLATELET  HEMOGLOBIN A1C  POTASSIUM                                                                                                                          Radiology No results found.  Pertinent labs & imaging results that were available during my care of the patient were reviewed by me and considered in my medical decision making (see MDM for details).  Medications Ordered in ED Medications  potassium chloride SA (KLOR-CON M) CR tablet 40 mEq (0 mEq Oral Hold 09/25/22 0933)  insulin aspart (novoLOG) injection 0-20 Units (has no administration in time range)  insulin aspart  (novoLOG) injection 0-5 Units (has no administration in time range)  insulin glargine-yfgn (SEMGLEE) injection 15 Units (0 Units Subcutaneous Hold 09/25/22 1436)  aspirin EC tablet 81 mg (has no administration in time range)  benazepril (LOTENSIN) tablet 10 mg (has no administration in time range)  levothyroxine (SYNTHROID) tablet 150 mcg (has no administration in time range)  metoprolol succinate (TOPROL-XL) 24 hr tablet 25 mg (has no administration in time range)  pantoprazole (PROTONIX) EC tablet 40 mg (has no administration in time range)  pravastatin (PRAVACHOL) tablet 40 mg (has no administration in time range)  sodium chloride 0.9 % bolus 1,000 mL (1,000 mLs Intravenous Bolus 09/25/22 0902)  potassium chloride 10 mEq in 100 mL IVPB (0 mEq Intravenous Stopped 09/25/22 1330)  Procedures .Critical Care  Performed by: Cristie Hem, MD Authorized by: Cristie Hem, MD   Critical care provider statement:    Critical care time (minutes):  30   Critical care was necessary to treat or prevent imminent or life-threatening deterioration of the following conditions:  [place]oxid[place]   Critical care was time spent personally by me on the following activities:  Development of treatment plan with patient or surrogate, discussions with consultants, evaluation of patient's response to treatment, examination of patient, ordering and review of laboratory studies, ordering and review of radiographic studies, ordering and performing treatments and interventions, pulse oximetry, re-evaluation of patient's condition and review of old charts   (including critical care time)  Medical Decision Making / ED Course   MDM:  67 year old male presenting to the emergency department after suspected overdose.  Patient well-appearing, vital signs reassuring, exam nonfocal other  than somnolence.  Suspect patient likely intoxicated from combination of alcohol and benzodiazepine.  He is somnolent but able to say that that he did not try to hurt himself was trying to "have a good time".  Will obtain basic labs given history of diabetes and age, blood sugar with EMS reassuring.  No external signs of trauma to suggest head injury, and patient without obvious focal neurologic deficit, other than orientation questions answers questions appropriately before falling back asleep, will defer head CT unless mental status worsens.  Will monitor on pulse oximetry for improvement in mental status.  Clinical Course as of 09/25/22 1453  Wed Sep 25, 2022  1152 On further history with nursing patient does endorse that he wanted to hurt himself.  Will place sitter at bedside consult TTS. [WS]  1449 Patient's insulin pump removed.  Placed orders for sliding scale insulin and basal insulin.  Patient more awake.  Medically cleared for psychiatric evaluation. [WS]    Clinical Course User Index [WS] Cristie Hem, MD     Additional history obtained: -Additional history obtained from family and ems -External records from outside source obtained and reviewed including: Chart review including previous notes, labs, imaging, consultation notes including cardiology note 10/30/21   Lab Tests: -I ordered, reviewed, and interpreted labs.   The pertinent results include:   Labs Reviewed  COMPREHENSIVE METABOLIC PANEL - Abnormal; Notable for the following components:      Result Value   Potassium 2.8 (*)    Glucose, Bld 100 (*)    Calcium 8.8 (*)    All other components within normal limits  ETHANOL - Abnormal; Notable for the following components:   Alcohol, Ethyl (B) 48 (*)    All other components within normal limits  RAPID URINE DRUG SCREEN, HOSP PERFORMED - Abnormal; Notable for the following components:   Benzodiazepines POSITIVE (*)    All other components within normal limits   CBG MONITORING, ED - Abnormal; Notable for the following components:   Glucose-Capillary 112 (*)    All other components within normal limits  RESP PANEL BY RT-PCR (RSV, FLU A&B, COVID)  RVPGX2  CBC WITH DIFFERENTIAL/PLATELET  HEMOGLOBIN A1C  POTASSIUM    Notable for mild hypokalemia, UDS + benzodiazepines  EKG   EKG Interpretation  Date/Time:  Wednesday September 25 2022 08:10:28 EST Ventricular Rate:  72 PR Interval:  141 QRS Duration: 90 QT Interval:  377 QTC Calculation: 413 R Axis:   52 Text Interpretation: Sinus rhythm Probable left atrial enlargement Borderline T abnormalities, inferior leads Confirmed by Garnette Gunner [phone]) on 09/25/2022 8:15:03 AM  Medicines ordered and prescription drug management: Meds ordered this encounter  Medications   sodium chloride 0.9 % bolus 1,000 mL   potassium chloride 10 mEq in 100 mL IVPB   potassium chloride SA (KLOR-CON M) CR tablet 40 mEq   DISCONTD: insulin glargine-yfgn (SEMGLEE) injection 15 Units   insulin aspart (novoLOG) injection 0-20 Units    Order Specific Question:   Correction coverage:    Answer:   Resistant (obese, steroids)    Order Specific Question:   CBG < 70:    Answer:   Implement Hypoglycemia Standing Orders and refer to Hypoglycemia Standing Orders sidebar report    Order Specific Question:   CBG 70 - 120:    Answer:   0 units    Order Specific Question:   CBG 121 - 150:    Answer:   3 units    Order Specific Question:   CBG 151 - 200:    Answer:   4 units    Order Specific Question:   CBG 201 - 250:    Answer:   7 units    Order Specific Question:   CBG 251 - 300:    Answer:   11 units    Order Specific Question:   CBG 301 - 350:    Answer:   15 units    Order Specific Question:   CBG 351 - 400:    Answer:   20 units    Order Specific Question:   CBG > 400    Answer:   call MD and obtain STAT lab verification   insulin aspart (novoLOG) injection 0-5 Units    Order Specific Question:    Correction coverage:    Answer:   HS scale    Order Specific Question:   CBG < 70:    Answer:   Implement Hypoglycemia Standing Orders and refer to Hypoglycemia Standing Orders sidebar report    Order Specific Question:   CBG 70 - 120:    Answer:   0 units    Order Specific Question:   CBG 121 - 150:    Answer:   0 units    Order Specific Question:   CBG 151 - 200:    Answer:   0 units    Order Specific Question:   CBG 201 - 250:    Answer:   2 units    Order Specific Question:   CBG 251 - 300:    Answer:   3 units    Order Specific Question:   CBG 301 - 350:    Answer:   4 units    Order Specific Question:   CBG 351 - 400:    Answer:   5 units    Order Specific Question:   CBG > 400    Answer:   call MD and obtain STAT lab verification   insulin glargine-yfgn (SEMGLEE) injection 15 Units   aspirin EC tablet 81 mg   benazepril (LOTENSIN) tablet 10 mg   levothyroxine (SYNTHROID) tablet 150 mcg   metoprolol succinate (TOPROL-XL) 24 hr tablet 25 mg   pantoprazole (PROTONIX) EC tablet 40 mg   pravastatin (PRAVACHOL) tablet 40 mg    -I have reviewed the patients home medicines and have made adjustments as needed    Cardiac Monitoring: The patient was maintained on a cardiac monitor.  I personally viewed and interpreted the cardiac monitored which showed an underlying rhythm of: NSR  Social Determinants of Health:  Diagnosis or treatment significantly  limited by social determinants of health: alcohol use   Reevaluation: After the interventions noted above, I reevaluated the patient and found that they have improved  Co morbidities that complicate the patient evaluation  Past Medical History:  Diagnosis Date   Allergy    Anxiety    Carpal tunnel syndrome    Cataract    CHF (congestive heart failure) ([place])    Colon polyp    Depression    Diabetes mellitus without complication ([place])    Erectile dysfunction    GERD (gastroesophageal reflux disease)    Hiatal hernia     History of diplopia     third nerve palsy 2013    Hyperlipidemia    Hypertension    Obesity    Sleep apnea    Sleep apnea with use of continuous positive airway pressure (CPAP)    diagnsed AHi of 20 in 2010 , titrated to 13 cm H20.   Thyroid disorder       Dispostion: Disposition decision including need for hospitalization was considered, and patient boarding for psychiatric evaluation.     Final Clinical Impression(s) / ED Diagnoses Final diagnoses:  Benzodiazepine overdose, intentional self-harm, initial encounter [facility])     This chart was dictated using voice recognition software.  Despite best efforts to proofread,  errors can occur which can change the documentation meaning.    Cristie Hem, MD 09/25/22 [phone]

## 2022-09-25 NOTE — Inpatient Diabetes Management (Addendum)
Inpatient Diabetes Program Recommendations  AACE/ADA: New Consensus Statement on Inpatient Glycemic Control   Target Ranges:  Prepandial:   less than 140 mg/dL      Peak postprandial:   less than 180 mg/dL (1-2 hours)      Critically ill patients:  140 - 180 mg/dL    Latest Reference Range & Units 09/25/22 08:26  Glucose-Capillary 70 - 99 mg/dL 112 (H)    Latest Reference Range & Units 09/25/22 08:14  Glucose 70 - 99 mg/dL 100 (H)   Review of Glycemic Control  Diabetes history: DM1 Outpatient Diabetes medications: Insulin Pump Current orders for Inpatient glycemic control: Semglee 15 units Q24H, Novolog 0-20 units TID with meals, Novolog 0-5 units QHS  Inpatient Diabetes Program Recommendations:    Insulin- Based on basal insulin from pump (provides 57.6 units/24 hours), he will likely need more basal insulin than currently ordered. If CBGs are consistently over 180 mg/dl, the night provider may want to order additional Semglee dose for bedtime.  Please consider ordering Novolog 5 units TID with meals if patient eats at least 50% of meals.  NOTE: Patient in ED via EMS for overdose of clonazepam and drinking fifth of vodka. Per note today by A. Danner, RN, patient stated he made an attempt at suicide and he was "tired of fighting to just get the resources he needs to manage his DM1. Patient stated that he made an attempt because he thought it may get the attention of his medical team and he would get better diabetes management." In reviewing chart, noted patient seen Lalla Brothers, NP on 06/24/22 for DM1 initial visit. No detailed notes from the office visit in [place] regarding visit on  06/24/22. Communicated via chat message with Caryl Pina, RN and she reports that patient's insulin pump has been removed and given to family.  09/25/22'@14'$ :45-Went to the ED to talk with patient. Patient's wife was outside the room talking with security and had patient's insulin pump. Reviewed insulin  pump settings and gave insulin pump back to patient's wife. The following are pump settings: Basal 12A 2.4 units/hour Total Daily Basal: 57.6 units Insulin Sensitivity  1:17 mg/dl Insulin to Carb Ratio 1:5 grams Target Glucose 110 mg/dl Spoke with patient at bedside (along with wife as she requested to be present to listen). Patient states he has had DM for over 20 years and that he has DM1. Patient reports that he just started seeing new Endocrinologist and seen them in October 2023. Patient reports that he uses the T-Slim insulin pump and Dexcom CGM and his sugars usually run fairly well and under 200 mg/dl.  Patient stated that he did not know why he could not have his insulin pump on. Explained that since he came in with suicidal ideation, it is our policy to remove the insulin pump. Patient still has on Dexcom on his left upper arm. When asked what type of issues he is having with getting his DM medications or supplies, patient stated that he was constantly running out of DM supplies and he has to fight with [employer] (medical supply company) and his providers to try to get the supplies he needs to keep his DM controlled. Patient reports that he has to change out his infusion site and insulin reservoir every 2 days because the insulin reservoir runs out of insulin in 2 days. Patient reports that he fills the reservoir with 300 units each time.  Patient reports most meals are 40-80 grams of carbs.   Asked  patient if he has checked to see how the prescription for the insulin pump sties and reservoir are written to be changed out every 2 days. Patient states that he has been having this issue for a while and he thinks it is prescribed that way. Patient states he does not understand why they can't get it figured out so he doesn't have such a hard time to get his DM supplies. Informed patient that I would reach out to [employer], NP's office to make them aware of his frustration with fighting to get his DM  supplies and see if they can work on getting the issue resolved. Informed patient that he will be ordered SQ basal, correction, and meal coverage insulin and if appropriate he can resume his insulin pump after discharge. Patient appreciative of information discussed and any efforts to get the issue of getting his DM supplies resolved. Called office of Lalla Brothers, NP and spoke with Dorian Pod. Informed Dorian Pod that patient is in the ED for SI and that he stated he attempted suicide because he "is tired of fighting to just get the resources he needs to manage his T1D. He made an attempt because he thought it may get the attention of his medical team and he would get better diabetes management." Dorian Pod transferred me to nurse Annitta Needs) and she will discuss situation with patient's diabetes educator Randall Hiss and Lalla Brothers, NP and someone will call back regarding the situation to see what can be done.   Addendum 09/25/22'@16'$ :15-Received call from Lalla Brothers, NP regarding issue patient is having. Meagan reports that the patient had reached out to their office regarding pump supplies and [place], CDE sent in the information to [place] that patient is to change out site and reservoir every 2 days. Meagan reports that patient has not reached back out to her office so they thought the issue was resolved. Meagan stated that she did not think there was anything different they could have done to prevent patient situation. Informed Meagan that I would follow up with patient on Thursday (09/26/22) and let him know that their office had sent the information to [place] to allow patient to change out infusion sites and reservoir every 2 days.   Thanks, Barnie Alderman, RN, MSN, [place] Diabetes Coordinator Inpatient Diabetes Program [phone] (Team Pager from 8am to [place])

## 2022-09-25 NOTE — ED Notes (Signed)
Pt is alert and awake, bipap  removed. Pt updated on plan of care. TTS provider notified that pt is now alert enough for consult

## 2022-09-26 DIAGNOSIS — R45851 Suicidal ideations: Secondary | ICD-10-CM

## 2022-09-26 DIAGNOSIS — F10129 Alcohol abuse with intoxication, unspecified: Secondary | ICD-10-CM | POA: Diagnosis present

## 2022-09-26 LAB — BASIC METABOLIC PANEL
Anion gap: 5 (ref 5–15)
BUN: 12 mg/dL (ref 8–23)
CO2: 24 mmol/L (ref 22–32)
Calcium: 7.9 mg/dL — ABNORMAL LOW (ref 8.9–10.3)
Chloride: 109 mmol/L (ref 98–111)
Creatinine, Ser: 0.68 mg/dL (ref 0.61–1.24)
GFR, Estimated: >60 mL/min (ref >60)
Glucose, Bld: 136 mg/dL — ABNORMAL HIGH (ref 70–99)
Potassium: 3.4 mmol/L — ABNORMAL LOW (ref 3.5–5.1)
Sodium: 138 mmol/L (ref 135–145)

## 2022-09-26 LAB — CBG MONITORING, ED
Glucose-Capillary: 120 mg/dL — ABNORMAL HIGH (ref 70–99)
Glucose-Capillary: 342 mg/dL — ABNORMAL HIGH (ref 70–99)
Glucose-Capillary: 356 mg/dL — ABNORMAL HIGH (ref 70–99)

## 2022-09-26 NOTE — ED Notes (Signed)
Patient having tts at this time

## 2022-09-26 NOTE — ED Notes (Signed)
Pt states he needs his 20U of insulin before dinner trays arrive because he knows his sugar is going up to high and he has a hard time keeping it under control. Insulin given and snack at bedside. Dinner tray will be here soon for patient. Will continue to monitor patient.

## 2022-09-26 NOTE — ED Provider Notes (Signed)
Emergency Medicine Observation Re-evaluation Note  Andrew Oconnor is a 67 y.o. male, seen on rounds today.  Pt initially presented to the ED for complaints of Drug Overdose Currently, the patient is Resting in bed, calm.  Physical Exam  BP 105/62   Pulse 62   Temp 98.7 F (37.1 C) (Oral)   Resp 12   Ht '4\' 7"'$  (1.397 m)   Wt 66.7 kg   SpO2 98%   BMI 34.17 kg/m  Physical Exam General: Resting comfortably Cardiac: well perfused Lungs: no increased WOB Psych: Calm  ED Course / MDM  EKG:EKG Interpretation  Date/Time:  Wednesday September 25 2022 08:10:28 EST Ventricular Rate:  72 PR Interval:  141 QRS Duration: 90 QT Interval:  377 QTC Calculation: 413 R Axis:   52 Text Interpretation: Sinus rhythm Probable left atrial enlargement Borderline T abnormalities, inferior leads Confirmed by Garnette Gunner [phone]) on 09/25/2022 8:15:03 AM  I have reviewed the labs performed to date as well as medications administered while in observation.    Plan  Current plan is for medically cleared for psych eval.    Audley Hose, MD 09/26/22 2010

## 2022-09-26 NOTE — ED Notes (Signed)
Pt received breakfast tray 

## 2022-09-26 NOTE — ED Notes (Signed)
Pt awake. A/o x 4. Cooperative. Pleasant at this time. Pt denies any si/hi/avh at this time. Pt states it just comes and goes but he feels a lot better than yesterday at this time. Advised will get him up to bathroom, take purewick off and sit in chair when breakfast comes. Pt agreed. Sitter at bedside

## 2022-09-26 NOTE — ED Notes (Signed)
Pt received dinner tray.

## 2022-09-26 NOTE — ED Provider Notes (Signed)
Patient seen by NP Adegbola with Psychiatry team. Patient is cleared from psychiatric perspective. Patient has been medically cleared from perspective of benzodiazepine overdose. Will discharge patient to home. All questions answered. Patient comfortable with plan of discharge. Return precautions discussed with patient and specified on the after visit summary.    Cristie Hem, MD 09/26/22 [phone]

## 2022-09-26 NOTE — Discharge Instructions (Addendum)
We evaluated you for your overdose on your clonazepam.  If you are feeling thoughts of wanting to harm yourself, please call the 988 suicide hotline or 911, you may also return to the emergency department.  Please see attached resources.  Please present to one of the following facilities to start medication management and therapy services:   [facility] [address]  [place], [place] [address] [phone]

## 2022-09-26 NOTE — Inpatient Diabetes Management (Addendum)
Inpatient Diabetes Program Recommendations  AACE/ADA: New Consensus Statement on Inpatient Glycemic Control   Target Ranges:  Prepandial:   less than 140 mg/dL      Peak postprandial:   less than 180 mg/dL (1-2 hours)      Critically ill patients:  140 - 180 mg/dL    Latest Reference Range & Units 09/25/22 08:26 09/25/22 16:22 09/25/22 21:49 09/26/22 07:56  Glucose-Capillary 70 - 99 mg/dL 112 (H) 189 (H) 129 (H) 120 (H)   Review of Glycemic Control  Diabetes history: DM1 Outpatient Diabetes medications: Insulin Pump Current orders for Inpatient glycemic control: Semglee 15 units Q24H, Novolog 0-20 units TID with meals, Novolog 0-5 units QHS  Inpatient Recommendations:  Insulin: Please consider ordering Novolog 6 units TID with meals if patient eats at least 50% of meals.   NOTE: Spoke with patient in ED (patient up walking out of his room to bathroom with NT at side). Informed patient that I spoke with Lalla Brothers, NP yesterday evening and she reported that Parker, CDE at their office sent the prescription for insulin sites and insulin reservoir to be changed out every 2 days on September 16, 2022. Meagan reports that they have not heard anything back from patient or [facility] so they assumed the issue was resolved. Patient states he has enough pump supplies to last him about 1 more week. Encouraged patient to call [facility] and make sure they had everything they needs so that he can get enough supplies to change out the insulin site and insulin reservoir every 2 days. Patient appreciative of time and call to Endocrinology and is hopeful that the issue will truly be resolved so he can get what he needs to keep his DM well controlled.   Thanks, Barnie Alderman, RN, MSN, [place] [facility] Coordinator Inpatient Diabetes Program [phone] (Team Pager from 8am to [place])

## 2022-09-26 NOTE — ED Notes (Signed)
This nurse went to give patient his long acting semglee. Pt stated he did not wish to take this insulin d/t it being long acting and the patient may be discharged later today per psych NP. The patient does want this long acting insulin that he is not used to taking as it may impact him later when he goes home and puts back on his insulin pump.

## 2022-09-26 NOTE — ED Notes (Signed)
Pt sleeping. Nad. Chest rise and fall noted. Sitter at bedside. Pt still voluntary at this time.

## 2022-09-26 NOTE — Consult Note (Addendum)
[facility] Face-to-Face Psychiatry Consult   Reason for Consult: Suicide attempt Referring Physician:  Cristie Hem, MD   Patient Identification: Andrew Oconnor MRN:  [id number] Principal Diagnosis: Suicidal ideation Diagnosis:  Principal Problem:   Suicidal ideation Active Problems:   Alcohol abuse with intoxication ([place])   Total Time spent with patient: 30 minutes  Subjective:   Andrew Oconnor is a 67 y.o. male patient admitted with suspected overdose.  Wife called EMS, due to alcohol intoxication, and confusion.   HPI:  Andrew Oconnor is a 67 yrs old male patient with a history of anxiety, depression, alcohol abuse,and alcohol intoxication.  Patient was brought to the [facility] ED by EMS after the wife called due to patient having confusion, alcohol intoxication and suspected overdose.    Patient was seen via telemetry by this provider and chart reviewed.   On evaluation patient is alert and oriented x3, speech is clear and coherent. Patient's eye contact is good, mood is euthymic, affect is appropriate.  Patient's thought process is goal directed and thought content is within normal limits. Patient denies SI HI, denies AVH, or paranoia. There is no indication that the patient is responding to internal stimuli and no delusion noted.    Patient reported that last night he was in a bad mood and he had a lot going on in his mind and some personal issues so he drank some liquor and took some of his prescription medications (clonazepam 0.'5mg'$ ). Patient was not able to tell how much of the prescription medication. Patient reported that he takes Klonopin 0.5 mg for restless leg syndrome and he takes it every night. Patient reported that he drinks occasionally and the last time he drank before last night was 6 days ago. Patient denies drug use. Patient reported having some medical issues like diabetes, high blood pressure and he said he takes his medication regularly. Patient reported  seeing his primary doctor but denies having any psychiatrist or therapist. He said the last time he saw his primary doctor was 2 weeks ago.  Patient says he lives with his wife Andrew Oconnor.   Patient denies having any fire weapon at home. Patient describes his sleep and appetite are as good.   Additional information was obtained from wife Andrew Oconnor [phone]) who endorsed that he lives with Carl, she said that they are married.  Andrew Oconnor reported that last night, she heard something hitting the door, a loud noise, she got up and Terrez was on the floor. She said that she helped him to get up, and she felt that something was not right. She said that patient was confused, talking but not making a lot of sense and appeared to be intoxicated. She said she called 911 and EMS came and took patient to the ED.  She said she knows that he had drank liquor, and she said he knows he is not suppose to be drinking because he takes prescription medicine and he has diabetes. She added that he takes his medications and sees his doctor regularly. Andrew Oconnor stated "I don't think he want to harm himself, nothing wrong between the two of [place]". She added that both of them just took down the christmas tree. She says I feel safe for him coming home.  Andrew Oconnor says that there is no fire weapon at home.   Support, encouragement and reassurance provided about ongoing stressors, patient and spouse were provided with opportunity for questions.    Patient does not meet inpatient psychiatry admission  criteria and there is no evidence of imminent danger to self or others.     Past Psychiatric History: Anxiety, depression, alcohol use, alcohol intoxication  Risk to Self:  Yes Risk to Others:  No Prior Inpatient Therapy:  Yes Prior Outpatient Therapy:  No  Past Medical History:  Past Medical History:  Diagnosis Date   Allergy    Anxiety    Carpal tunnel syndrome    Cataract    CHF (congestive heart failure) (HCC)    Colon  polyp    Depression    Diabetes mellitus without complication ([place])    Erectile dysfunction    GERD (gastroesophageal reflux disease)    Hiatal hernia    History of diplopia     third nerve palsy 2013    Hyperlipidemia    Hypertension    Obesity    Sleep apnea    Sleep apnea with use of continuous positive airway pressure (CPAP)    diagnsed AHi of 20 in 2010 , titrated to 13 cm H20.   Thyroid disorder     Past Surgical History:  Procedure Laterality Date   CATARACT EXTRACTION W/PHACO Right 10/09/2015   Procedure: CATARACT EXTRACTION PHACO AND INTRAOCULAR LENS PLACEMENT (IOC);  Surgeon: Williams Che, MD;  Location: [facility];  Service: Ophthalmology;  Laterality: Right;  CDE:2.17   HAND SURGERY Bilateral    secondary to nerve damage   hemorrectomy     LEFT HEART CATHETERIZATION WITH CORONARY ANGIOGRAM N/A 11/24/2014   Procedure: LEFT HEART CATHETERIZATION WITH CORONARY ANGIOGRAM;  Surgeon: Peter M Martinique, MD;  [facility];  Service: Cardiovascular;  Laterality: N/A;   LESION REMOVAL N/A 05/04/2021   Procedure: EXCISION OF CHRONIC SUPRAPUBIC ABSCESS;  Surgeon: Greer Pickerel, MD;  Location: [facility];  Service: General;  Laterality: N/A;   Family History:  Family History  Problem Relation Age of Onset   Heart disease Mother    Heart disease Father    Diabetes Father    Stroke Father    Seizures Father    Stroke Other    Cancer Sister    Heart disease Sister    Heart disease Brother    Diabetes Brother    Heart disease Maternal Grandmother    Heart disease Maternal Grandfather    Heart disease Paternal Grandmother    Heart disease Paternal Grandfather    Colon cancer Neg Hx    Esophageal cancer Neg Hx    Rectal cancer Neg Hx    Stomach cancer Neg Hx    Family Psychiatric  History: See note Social History:  Social History   Substance and Sexual Activity  Alcohol Use Yes   Comment: occas.     Social History   Substance and Sexual Activity   Drug Use No    Social History   Socioeconomic History   Marital status: Married    Spouse name: Andrew Oconnor   Number of children: 0   Years of education: 12   Highest education level: Not on file  Occupational History   Occupation: retired    Comment: Water quality scientist  Tobacco Use   Smoking status: Former    Types: Cigarettes    Quit date: 09/09/2002    Years since quitting: 20.0   Smokeless tobacco: Never   Tobacco comments:    quit smoking 2004  Vaping Use   Vaping Use: Never used  Substance and Sexual Activity   Alcohol use: Yes    Comment: occas.  Drug use: No   Sexual activity: Not on file  Other Topics Concern   Not on file  Social History Narrative    may continue treatment of complex apnea with his current CPAP setting , good residual AHI of 3.3/   Has improved by [facility] and has only once a night  nocturia , less shortness of breath.   gel mask fits well acc. to air leak data but has left a pressure mark.    He may need an alternative mask until it's healed. I recommended a swift FX medium pillows .   DME [employer] - machine , HCS with Nicole Kindred,  RT.      Patient is married Andrew Oconnor) and lives at home with his wife and step-son.   Patient is retired.   Patient has a high school.   Patient is right-handed.   Patient drinks 1-2 cups of coffee every morning.            Social Determinants of Health   Financial Resource Strain: Not on file  Food Insecurity: Not on file  Transportation Needs: Not on file  Physical Activity: Not on file  Stress: Not on file  Social Connections: Not on file   Additional Social History:    Allergies:   Allergies  Allergen Reactions   Donepezil Other (See Comments)   Gabapentin     Other Reaction(s): Other (See Comments)  Mood swing   Ropinirole    Ropinirole Hcl Other (See Comments)    Labs:  Results for orders placed or performed during the hospital encounter of 09/25/22 (from the past 48 hour(s))  Comprehensive  metabolic panel     Status: Abnormal   Collection Time: 09/25/22  8:14 AM  Result Value Ref Range   Sodium 137 135 - 145 mmol/L   Potassium 2.8 (L) 3.5 - 5.1 mmol/L   Chloride 103 98 - 111 mmol/L   CO2 24 22 - 32 mmol/L   Glucose, Bld 100 (H) 70 - 99 mg/dL    Comment: Glucose reference range applies only to samples taken after fasting for at least 8 hours.   BUN 9 8 - 23 mg/dL   Creatinine, Ser 0.81 0.61 - 1.24 mg/dL   Calcium 8.8 (L) 8.9 - 10.3 mg/dL   Total Protein 7.2 6.5 - 8.1 g/dL   Albumin 3.8 3.5 - 5.0 g/dL   AST 27 15 - 41 U/L   ALT 28 0 - 44 U/L   Alkaline Phosphatase 105 38 - 126 U/L   Total Bilirubin 0.9 0.3 - 1.2 mg/dL   GFR, Estimated >60 >60 mL/min    Comment: (NOTE) Calculated using the CKD-EPI Creatinine Equation (2021)    Anion gap 10 5 - 15    Comment: Performed at [facility], [address] [place], [place] [address]  CBC with Differential     Status: None   Collection Time: 09/25/22  8:14 AM  Result Value Ref Range   WBC 6.2 4.0 - 10.5 K/uL   RBC 5.22 4.22 - 5.81 MIL/uL   Hemoglobin 16.1 13.0 - 17.0 g/dL   HCT 46.1 39.0 - 52.0 %   MCV 88.3 80.0 - 100.0 fL   MCH 30.8 26.0 - 34.0 pg   MCHC 34.9 30.0 - 36.0 g/dL   RDW 13.0 11.5 - 15.5 %   Platelets 218 150 - 400 K/uL   nRBC 0.0 0.0 - 0.2 %   Neutrophils Relative % 52 %   Neutro Abs 3.2 1.7 -  7.7 K/uL   Lymphocytes Relative 31 %   Lymphs Abs 1.9 0.7 - 4.0 K/uL   Monocytes Relative 8 %   Monocytes Absolute 0.5 0.1 - 1.0 K/uL   Eosinophils Relative 8 %   Eosinophils Absolute 0.5 0.0 - 0.5 K/uL   Basophils Relative 1 %   Basophils Absolute 0.0 0.0 - 0.1 K/uL   Immature Granulocytes 0 %   Abs Immature Granulocytes 0.02 0.00 - 0.07 K/uL    Comment: Performed at [facility], [address] [place], [place] [address]  Ethanol     Status: Abnormal   Collection Time: 09/25/22  8:14 AM  Result Value Ref Range   Alcohol, Ethyl (B) 48 (H) <10 mg/dL    Comment: (NOTE) Lowest detectable limit for serum  alcohol is 10 mg/dL.  For medical purposes only. Performed at [facility], [address], [place], [place] [address]   CBG monitoring, ED     Status: Abnormal   Collection Time: 09/25/22  8:26 AM  Result Value Ref Range   Glucose-Capillary 112 (H) 70 - 99 mg/dL    Comment: Glucose reference range applies only to samples taken after fasting for at least 8 hours.  Resp panel by RT-PCR (RSV, Flu A&B, Covid) Anterior Nasal Swab     Status: None   Collection Time: 09/25/22  9:40 AM   Specimen: Anterior Nasal Swab  Result Value Ref Range   SARS Coronavirus 2 by RT PCR NEGATIVE NEGATIVE    Comment: (NOTE) SARS-CoV-2 target nucleic acids are NOT DETECTED.  The SARS-CoV-2 RNA is generally detectable in upper respiratory specimens during the acute phase of infection. The lowest concentration of SARS-CoV-2 viral copies this assay can detect is 138 copies/mL. A negative result does not preclude SARS-Cov-2 infection and should not be used as the sole basis for treatment or other patient management decisions. A negative result may occur with  improper specimen collection/handling, submission of specimen other than nasopharyngeal swab, presence of viral mutation(s) within the areas targeted by this assay, and inadequate number of viral copies(<138 copies/mL). A negative result must be combined with clinical observations, patient history, and epidemiological information. The expected result is Negative.  Fact Sheet for Patients:  EntrepreneurPulse.com.au  Fact Sheet for Healthcare Providers:  IncredibleEmployment.be  This test is no t yet approved or cleared by the [place] FDA and  has been authorized for detection and/or diagnosis of SARS-CoV-2 by FDA under an Emergency Use Authorization (EUA). This EUA will remain  in effect (meaning this test can be used) for the duration of the COVID-19 declaration under Section 564(b)(1) of the Act,  21 U.S.C.section 360bbb-3(b)(1), unless the authorization is terminated  or revoked sooner.       Influenza A by PCR NEGATIVE NEGATIVE   Influenza B by PCR NEGATIVE NEGATIVE    Comment: (NOTE) The Xpert Xpress SARS-CoV-2/FLU/RSV plus assay is intended as an aid in the diagnosis of influenza from Nasopharyngeal swab specimens and should not be used as a sole basis for treatment. Nasal washings and aspirates are unacceptable for Xpert Xpress SARS-CoV-2/FLU/RSV testing.  Fact Sheet for Patients: EntrepreneurPulse.com.au  Fact Sheet for Healthcare Providers: IncredibleEmployment.be  This test is not yet approved or cleared by the [place] FDA and has been authorized for detection and/or diagnosis of SARS-CoV-2 by FDA under an Emergency Use Authorization (EUA). This EUA will remain in effect (meaning this test can be used) for the duration of the COVID-19 declaration under Section 564(b)(1) of the Act, 21  U.S.C. section 360bbb-3(b)(1), unless the authorization is terminated or revoked.     Resp Syncytial Virus by PCR NEGATIVE NEGATIVE    Comment: (NOTE) Fact Sheet for Patients: EntrepreneurPulse.com.au  Fact Sheet for Healthcare Providers: IncredibleEmployment.be  This test is not yet approved or cleared by the [place] FDA and has been authorized for detection and/or diagnosis of SARS-CoV-2 by FDA under an Emergency Use Authorization (EUA). This EUA will remain in effect (meaning this test can be used) for the duration of the COVID-19 declaration under Section 564(b)(1) of the Act, 21 U.S.C. section 360bbb-3(b)(1), unless the authorization is terminated or revoked.  Performed at [facility], [address], [place], [place] [address]   Urine rapid drug screen (hosp performed)     Status: Abnormal   Collection Time: 09/25/22  1:40 PM  Result Value Ref Range   Opiates NONE DETECTED NONE  DETECTED   Cocaine NONE DETECTED NONE DETECTED   Benzodiazepines POSITIVE (A) NONE DETECTED   Amphetamines NONE DETECTED NONE DETECTED   Tetrahydrocannabinol NONE DETECTED NONE DETECTED   Barbiturates NONE DETECTED NONE DETECTED    Comment: (NOTE) DRUG SCREEN FOR MEDICAL PURPOSES ONLY.  IF CONFIRMATION IS NEEDED FOR ANY PURPOSE, NOTIFY LAB WITHIN 5 DAYS.  LOWEST DETECTABLE LIMITS FOR URINE DRUG SCREEN Drug Class                     Cutoff (ng/mL) Amphetamine and metabolites    1000 Barbiturate and metabolites    200 Benzodiazepine                 200 Opiates and metabolites        300 Cocaine and metabolites        300 THC                            50 Performed at [facility], [address], [place], [place] [address]   Hemoglobin A1c     Status: Abnormal   Collection Time: 09/25/22  2:09 PM  Result Value Ref Range   Hgb A1c MFr Bld 5.8 (H) 4.8 - 5.6 %    Comment: (NOTE) Pre diabetes:          5.7%-6.4%  Diabetes:              >6.4%  Glycemic control for   <7.0% adults with diabetes    Mean Plasma Glucose 119.76 mg/dL    Comment: Performed at [facility] [address] [place], [place] [address]  Potassium     Status: None   Collection Time: 09/25/22  3:32 PM  Result Value Ref Range   Potassium 3.7 3.5 - 5.1 mmol/L    Comment: DELTA CHECK NOTED Performed at [facility], [address], [place], [place] [address]   CBG monitoring, ED     Status: Abnormal   Collection Time: 09/25/22  4:22 PM  Result Value Ref Range   Glucose-Capillary 189 (H) 70 - 99 mg/dL    Comment: Glucose reference range applies only to samples taken after fasting for at least 8 hours.  CBG monitoring, ED     Status: Abnormal   Collection Time: 09/25/22  9:49 PM  Result Value Ref Range   Glucose-Capillary 129 (H) 70 - 99 mg/dL    Comment: Glucose reference range applies only to samples taken after fasting for at least 8 hours.  Basic metabolic panel     Status:  Abnormal   Collection  Time: 09/26/22  7:27 AM  Result Value Ref Range   Sodium 138 135 - 145 mmol/L   Potassium 3.4 (L) 3.5 - 5.1 mmol/L   Chloride 109 98 - 111 mmol/L   CO2 24 22 - 32 mmol/L   Glucose, Bld 136 (H) 70 - 99 mg/dL    Comment: Glucose reference range applies only to samples taken after fasting for at least 8 hours.   BUN 12 8 - 23 mg/dL   Creatinine, Ser 0.68 0.61 - 1.24 mg/dL   Calcium 7.9 (L) 8.9 - 10.3 mg/dL   GFR, Estimated >60 >60 mL/min    Comment: (NOTE) Calculated using the CKD-EPI Creatinine Equation (2021)    Anion gap 5 5 - 15    Comment: Performed at [facility], [address], [place],  [address]  CBG monitoring, ED     Status: Abnormal   Collection Time: 09/26/22  7:56 AM  Result Value Ref Range   Glucose-Capillary 120 (H) 70 - 99 mg/dL    Comment: Glucose reference range applies only to samples taken after fasting for at least 8 hours.  CBG monitoring, ED     Status: Abnormal   Collection Time: 09/26/22 12:41 PM  Result Value Ref Range   Glucose-Capillary 342 (H) 70 - 99 mg/dL    Comment: Glucose reference range applies only to samples taken after fasting for at least 8 hours.  CBG monitoring, ED     Status: Abnormal   Collection Time: 09/26/22  4:33 PM  Result Value Ref Range   Glucose-Capillary 356 (H) 70 - 99 mg/dL    Comment: Glucose reference range applies only to samples taken after fasting for at least 8 hours.    Current Facility-Administered Medications  Medication Dose Route Frequency Provider Last Rate Last Admin   aspirin EC tablet 81 mg  81 mg Oral Daily Cristie Hem, MD   81 mg at 09/26/22 1005   benazepril (LOTENSIN) tablet 10 mg  10 mg Oral Daily Cristie Hem, MD   10 mg at 09/26/22 1005   insulin aspart (novoLOG) injection 0-20 Units  0-20 Units Subcutaneous TID WC Cristie Hem, MD   20 Units at 09/26/22 1710   insulin aspart (novoLOG) injection 0-5 Units  0-5 Units Subcutaneous QHS Cristie Hem, MD       insulin  glargine-yfgn (SEMGLEE) injection 15 Units  15 Units Subcutaneous Q24H Cristie Hem, MD   15 Units at 09/25/22 1436   levothyroxine (SYNTHROID) tablet 150 mcg  150 mcg Oral QAC breakfast Cristie Hem, MD   150 mcg at 09/26/22 0730   metoprolol succinate (TOPROL-XL) 24 hr tablet 25 mg  25 mg Oral Daily Cristie Hem, MD   25 mg at 09/26/22 1005   pantoprazole (PROTONIX) EC tablet 40 mg  40 mg Oral Daily Cristie Hem, MD   40 mg at 09/26/22 1004   potassium chloride SA (KLOR-CON M) CR tablet 40 mEq  40 mEq Oral Once Cristie Hem, MD       pravastatin (PRAVACHOL) tablet 40 mg  40 mg Oral Daily Cristie Hem, MD   40 mg at 09/26/22 1004   Current Outpatient Medications  Medication Sig Dispense Refill   aspirin EC 81 MG tablet Take 1 tablet (81 mg total) by mouth daily. Swallow whole. 90 tablet 3   benazepril (LOTENSIN) 10 MG tablet TAKE 1 TABLET BY MOUTH EVERY DAY (Patient taking differently:  Take 5 mg by mouth daily.) 90 tablet 3   clonazePAM (KLONOPIN) 0.5 MG tablet Take 0.5 mg by mouth daily as needed for anxiety.     hydrochlorothiazide (HYDRODIURIL) 25 MG tablet Take 1/2 tablet as needed for bp > 160 15 tablet 6   insulin lispro (HUMALOG) 100 UNIT/ML cartridge Inject 80 Units into the skin daily.     levothyroxine (SYNTHROID, LEVOTHROID) 150 MCG tablet Take 150 mcg by mouth daily before breakfast.      metoprolol succinate (TOPROL-XL) 25 MG 24 hr tablet TAKE 1 TABLET (25 MG TOTAL) BY MOUTH IN THE MORNING AND AT BEDTIME. 60 tablet 10   omeprazole (PRILOSEC) 40 MG capsule Take 40 mg by mouth daily.  2   pravastatin (PRAVACHOL) 40 MG tablet Take 40 mg by mouth daily.      Blood Glucose Calibration (OT ULTRA/FASTTK CNTRL SOLN) SOLN       Musculoskeletal: Strength & Muscle Tone: within normal limits Gait & Station: normal Patient leans: N/A  Psychiatric Specialty Exam:  Presentation  General Appearance: Appropriate for Environment  Eye  Contact:Good  Speech:Clear and Coherent; Normal Rate  Speech Volume:Normal  Handedness:No data recorded  Mood and Affect  Mood:Euthymic  Affect:Appropriate   Thought Process  Thought Processes:Goal Directed  Descriptions of Associations:Intact  Orientation:Full (Time, Place and Person)  Thought Content:WDL  History of Schizophrenia/Schizoaffective disorder:No data recorded Duration of Psychotic Symptoms:No data recorded Hallucinations:Hallucinations: None  Ideas of Reference:None  Suicidal Thoughts:Suicidal Thoughts: No  Homicidal Thoughts:Homicidal Thoughts: No   Sensorium  Memory:Immediate Good; Recent Good  Judgment:Good  Insight:Good   Executive Functions  Concentration:Good  Attention Span:Good  [place] of Knowledge:Good  Language:No data recorded  Psychomotor Activity  Psychomotor Activity:Psychomotor Activity: Normal   Assets  Assets:Communication Skills; Desire for Improvement; Housing; Social Support   Sleep  Sleep:Sleep: Good   Physical Exam: Physical Exam Constitutional:      Appearance: Normal appearance.  Eyes:     General:        Right eye: No discharge.        Left eye: No discharge.  Cardiovascular:     Pulses: Normal pulses.  Pulmonary:     Effort: No respiratory distress.     Breath sounds: No wheezing.  Neurological:     Mental Status: He is alert and oriented to person, place, and time.  Psychiatric:        Attention and Perception: Attention normal. He does not perceive auditory or visual hallucinations.        Mood and Affect: Mood and affect normal.        Speech: Speech normal.        Behavior: Behavior normal. Behavior is cooperative.        Thought Content: Thought content is not paranoid. Thought content does not include homicidal or suicidal ideation.        Judgment: Judgment normal.    Review of Systems  Constitutional:  Negative for fever.  Respiratory:  Negative for cough, shortness of  breath and wheezing.   Cardiovascular:  Negative for chest pain.  Neurological:  Negative for dizziness, seizures, weakness and headaches.  Psychiatric/Behavioral:  Negative for depression, hallucinations and suicidal ideas.    Blood pressure (!) 112/58, pulse 62, temperature 97.7 F (36.5 C), temperature source Oral, resp. rate 16, height '4\' 7"'$  (1.397 m), weight 66.7 kg, SpO2 98 %. Body mass index is 34.17 kg/m.  Treatment Plan Summary: Plan : Patient will be discharged home. Patient will follow  up with outpatient substance abuse treatment. Patient will be given outpatient resources for substance abuse treatment. Wife was called and made aware of the discharge plan, she agreed to the plan.    Resources for outpatient resources ans substance abuse were added to the AVS.   Disposition: No evidence of imminent risk to self or others at present.   Patient does not meet criteria for psychiatric inpatient admission. Discussed crisis plan, support from social network, calling 911, coming to the Emergency Department, and calling Suicide Hotline.  Discussed methods to reduce the risk of self-injury or suicide attempts: Frequent conversations regarding unsafe thoughts. Remove all significant sharps. Remove all firearms. Remove all medications, including over-the-counter meds. Consider lockbox for medications and having a responsible person dispense medications until patient has strengthened coping skills. Room checks for sharps or other harmful objects. Secure all chemical substances that can be ingested or inhaled.   Please refrain from using alcohol or illicit substances, as they can affect your mood and can cause depression, anxiety or other concerning symptoms.  Alcohol can increase the chance that a person will make reckless decisions, like attempting suicide, and can increase the lethality of a drug overdose.      Discussed crisis plan, calling 911, or going to the ED if condition changes or  worsens.  Patient verbalized her understanding.    Earney Mallet, NP 09/26/2022 6:36 PM

## 2022-09-26 NOTE — Progress Notes (Signed)
Went to do BIPAP check patient off, spo2 95-97% on r/a no distress noted.

## 2022-09-26 NOTE — ED Notes (Signed)
Pt received lunch tray 
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
## 2022-09-27 DIAGNOSIS — E108 Type 1 diabetes mellitus with unspecified complications: Secondary | ICD-10-CM | POA: Diagnosis not present

## 2022-09-29 DIAGNOSIS — E1065 Type 1 diabetes mellitus with hyperglycemia: Secondary | ICD-10-CM | POA: Diagnosis not present

## 2022-10-29 NOTE — Progress Notes (Signed)
Cardiology Office Note    Date:  11/05/2022   ID:  Andrew Oconnor, DOB 1956-01-13, MRN [id number]  PCP:  [facility]  Cardiologist:  Dr. Martinique  Chief Complaint  Patient presents with   Congestive Heart Failure   Hypertension    History of Present Illness:  Andrew Oconnor is a 67 y.o. 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  He had a cardiac catheterization on 11/24/2014 which demonstrated normal anatomy, normal LV function, normal EDP. Echocardiogram showed EF 55 to [id number], normal diastolic function.  Heart monitor obtained in May 2016 showed 7 beats run of NSVT.  This is controlled on beta-blocker.  He was seen in 2019 with chest pain. ETT was done and was normal.   On follow up today he is doing well.  Stays busy. No chest pain or SOB. No edema. BP is controlled. Lisinopril dose was reduced recently due to some low BP readings.  No palpitations. Generally feels better since he got a new insulin pump. Sugar is well controlled.    Past Medical History:  Diagnosis Date   Allergy    Anxiety    Carpal tunnel syndrome    Cataract    CHF (congestive heart failure) (HCC)    Colon polyp    Depression    Diabetes mellitus without complication ([place])    Erectile dysfunction    GERD (gastroesophageal reflux disease)    Hiatal hernia    History of diplopia     third nerve palsy 2013    Hyperlipidemia    Hypertension    Obesity    Sleep apnea    Sleep apnea with use of continuous positive airway pressure (CPAP)    diagnsed AHi of 20 in 2010 , titrated to 13 cm H20.   Thyroid disorder     Past Surgical History:  Procedure Laterality Date   CATARACT EXTRACTION W/PHACO Right 10/09/2015   Procedure: CATARACT EXTRACTION PHACO AND INTRAOCULAR LENS PLACEMENT (IOC);  Surgeon: Williams Che, MD;  Location: [facility];  Service: Ophthalmology;  Laterality: Right;  CDE:2.17   HAND SURGERY Bilateral     secondary to nerve damage   hemorrectomy     LEFT HEART CATHETERIZATION WITH CORONARY ANGIOGRAM N/A 11/24/2014   Procedure: LEFT HEART CATHETERIZATION WITH CORONARY ANGIOGRAM;  Surgeon: Samina Weekes M Martinique, MD;  Location: [facility] CATH LAB;  Service: Cardiovascular;  Laterality: N/A;   LESION REMOVAL N/A 05/04/2021   Procedure: EXCISION OF CHRONIC SUPRAPUBIC ABSCESS;  Surgeon: Greer Pickerel, MD;  Location: [facility];  Service: General;  Laterality: N/A;    Current Medications: Outpatient Medications Prior to Visit  Medication Sig Dispense Refill   aspirin EC 81 MG tablet Take 1 tablet (81 mg total) by mouth daily. Swallow whole. 90 tablet 3   benazepril (LOTENSIN) 10 MG tablet Take 5 mg by mouth daily. Take 1/2 tablet by mouth daily.     Blood Glucose Calibration (OT ULTRA/FASTTK CNTRL SOLN) SOLN      clonazePAM (KLONOPIN) 0.5 MG tablet Take 0.5 mg by mouth daily as needed for anxiety.     insulin lispro (HUMALOG) 100 UNIT/ML cartridge Inject 80 Units into the skin daily.     levothyroxine (SYNTHROID, LEVOTHROID) 150 MCG tablet Take 150 mcg by mouth daily before breakfast.      omeprazole (PRILOSEC) 40 MG capsule Take 40 mg by mouth daily.  2   pravastatin (PRAVACHOL) 40 MG tablet  Take 40 mg by mouth daily.      hydrochlorothiazide (HYDRODIURIL) 25 MG tablet Take 1/2 tablet as needed for bp > 160 15 tablet 6   metoprolol succinate (TOPROL-XL) 25 MG 24 hr tablet TAKE 1 TABLET (25 MG TOTAL) BY MOUTH IN THE MORNING AND AT BEDTIME. 60 tablet 10   benazepril (LOTENSIN) 10 MG tablet TAKE 1 TABLET BY MOUTH EVERY DAY (Patient taking differently: Take 5 mg by mouth daily.) 90 tablet 3   No facility-administered medications prior to visit.     Allergies:   Donepezil, Gabapentin, Ropinirole, and Ropinirole hcl   Social History   Socioeconomic History   Marital status: Married    Spouse name: [name]   Number of children: 0   Years of education: 12   Highest education level: Not on file   Occupational History   Occupation: retired    Comment: Water quality scientist  Tobacco Use   Smoking status: Former    Types: Cigarettes    Quit date: 09/09/2002    Years since quitting: 20.1   Smokeless tobacco: Never   Tobacco comments:    quit smoking 2004  Vaping Use   Vaping Use: Never used  Substance and Sexual Activity   Alcohol use: Yes    Comment: occas.   Drug use: No   Sexual activity: Not on file  Other Topics Concern   Not on file  Social History Narrative    may continue treatment of complex apnea with his current CPAP setting , good residual AHI of 3.3/   Has improved by [facility] and has only once a night  nocturia , less shortness of breath.   gel mask fits well acc. to air leak data but has left a pressure mark.    He may need an alternative mask until it's healed. I recommended a swift FX medium pillows .   DME [employer] - machine , HCS with Nicole Kindred,  RT.      Patient is married [name]) and lives at home with his wife and step-son.   Patient is retired.   Patient has a high school.   Patient is right-handed.   Patient drinks 1-2 cups of coffee every morning.            Social Determinants of Health   Financial Resource Strain: Not on file  Food Insecurity: Not on file  Transportation Needs: Not on file  Physical Activity: Not on file  Stress: Not on file  Social Connections: Not on file     Family History:  The patient's family history includes Cancer in his sister; Diabetes in his brother and father; Heart disease in his brother, father, maternal grandfather, maternal grandmother, mother, paternal grandfather, paternal grandmother, and sister; Seizures in his father; Stroke in his father and another family member.   ROS:   Please see the history of present illness.    ROS All other systems reviewed and are negative.   PHYSICAL EXAM:   VS:  BP (!) 120/52   Pulse 69   Ht '4\' 7"'$  (1.397 m)   Wt 149 lb 12.8 oz (67.9 kg)   SpO2 97%   BMI 34.82 kg/m     GEN: Well nourished, well developed, in no acute distress  HEENT: normal  Neck: no JVD, carotid bruits, or masses Cardiac: RRR; no murmurs, rubs, or gallops,no edema  Respiratory:  clear to auscultation bilaterally, normal work of breathing GI: soft, nontender, nondistended, + BS MS: no deformity or atrophy  Skin: warm and dry, no rash Neuro:  Alert and Oriented x 3, Strength and sensation are intact Psych: euthymic mood, full affect  Wt Readings from Last 3 Encounters:  11/05/22 149 lb 12.8 oz (67.9 kg)  09/25/22 147 lb (66.7 kg)  10/30/21 150 lb 12.8 oz (68.4 kg)      Studies/Labs Reviewed:   EKG:  EKG is not ordered today.   Recent Labs: 09/25/2022: ALT 28; Hemoglobin 16.1; Platelets 218 09/26/2022: BUN 12; Creatinine, Ser 0.68; Potassium 3.4; Sodium 138   Lipid Panel No results found for: "CHOL", "TRIG", "HDL", "CHOLHDL", "VLDL", "LDLCALC", "LDLDIRECT"  Dated 05/31/21: cholesterol 156, triglycerides 122, HDL 45, LDL 87. CBC and CMET normal Dated 09/07/21: A1c 6.2% Dated 09/25/22: A1c 5.8%.  Dated 09/19/22: normal CBC. Glucose 111, otherwise CMET normal. Cholesterol 150, triglycerides 117, HDL 45, LDL 84.   Additional studies/ records that were reviewed today include:   Cath 11/23/2016 Procedural Findings: Hemodynamics: AO 156/78 mean 114 mm Hg LV 166/32 mm Hg   Coronary angiography: Coronary dominance: left   Left mainstem:    Left anterior descending (LAD): Normal    Left circumflex (LCx): Normal   Right coronary artery (RCA): Normal   Left ventriculography: Left ventricular systolic function is normal, LVEF is estimated at 55-65%, there is no significant mitral regurgitation. The proximal aorta is mildly dilated.    Final Conclusions:   1. Normal coronary anatomy. 2. Normal LV function 3. Elevated LV EDP   Echo 11/25/2014 LV EF: 55% -   60% Study Conclusions  - Left ventricle: The cavity size was normal. Systolic function was   normal. The estimated  ejection fraction was in the range of 55%   to 60%. Wall motion was normal; there were no regional wall   motion abnormalities. Left ventricular diastolic function   parameters were normal. - Left atrium: The atrium was mildly dilated. - Atrial septum: No defect or patent foramen ovale was identified. - Pericardium, extracardiac: A trivial pericardial effusion was   identified posterior to the heart.   Event monitor 01/18/2015 Study Highlights   NSR and sinus tachycardia 7 beat run of NSVT Symptoms of skipped beats and tachycardia do not correlate with arrythmia.   1. Normal sinus rhythm 2. 7 beat run NSVT 3. Symptoms do not correlate with arrhythmia.   ETT 07/14/18: Stress Findings  Stress Findings The patient exercised following the Bruce protocol.   The patient reported shortness of breath during the stress test. The patient experienced no angina during the stress test.   The test was stopped because  the patient complained of fatigue and shortness of breath.   Heart rate demonstrated a normal response to exercise. Blood pressure demonstrated a hypertensive response to exercise. Overall, the patient's exercise capacity was normal.   85% of maximum heart rate was achieved after 6.5 minutes.  Recovery time:  5 minutes.   Duke Treadmill Score: low risk The patient's response to exercise was adequate for diagnosis.   ASSESSMENT:    1. Primary hypertension   2. Hyperlipidemia LDL goal <70   3. Chronic diastolic heart failure (HCC)      PLAN:  In order of problems listed above:  Palpitation: minor arrhythmia on monitor. Well controlled on Toprol.  Hypertension: well controlled. Will reduce Toprol XL to 25 mg daily. Continue benazepril. Does not need to take HCTZ.   Hyperlipidemia: On pravastatin 40 mg. LDL 84.   Hypothyroidism: Managed by primary care provider  Chronic diastolic heart failure: Euvolemic  on physical exam.     Medication Adjustments/Labs and Tests  Ordered: Current medicines are reviewed at length with the patient today.  Concerns regarding medicines are outlined above.  Medication changes, Labs and Tests ordered today are listed in the Patient Instructions below. There are no Patient Instructions on file for this visit.    Signed, Arsema Tusing Martinique, MD  11/05/2022 9:34 AM    [facility] [address], [place], [place]  [address] Phone: [phone]; Fax: [phone]

## 2022-11-05 ENCOUNTER — Encounter: Payer: Self-pay | Admitting: Cardiology

## 2022-11-05 ENCOUNTER — Ambulatory Visit: Payer: Medicare PPO | Attending: Cardiology | Admitting: Cardiology

## 2022-11-05 VITALS — BP 120/52 | HR 69 | Ht <= 58 in | Wt 149.8 lb

## 2022-11-05 DIAGNOSIS — I10 Essential (primary) hypertension: Secondary | ICD-10-CM

## 2022-11-05 DIAGNOSIS — E785 Hyperlipidemia, unspecified: Secondary | ICD-10-CM | POA: Diagnosis not present

## 2022-11-05 DIAGNOSIS — I5032 Chronic diastolic (congestive) heart failure: Secondary | ICD-10-CM

## 2022-11-05 MED ORDER — METOPROLOL SUCCINATE ER 25 MG PO TB24: 25.0000 mg | ORAL_TABLET | Freq: Every day | ORAL | 3 refills | Status: DC

## 2022-11-05 NOTE — Patient Instructions (Signed)
Medication Instructions:  Stop taking Hydrochlorothiazide Decrease Metoprolol Succinate (Toprol XL) to 25 mg= 1 tablet once a day *If you need a refill on your cardiac medications before your next appointment, please call your pharmacy*  Follow-Up: At [facility], you and your health needs are our priority.  As part of our continuing mission to provide you with exceptional heart care, we have created designated Provider Care Teams.  These Care Teams include your primary Cardiologist (physician) and Advanced Practice Providers (APPs -  Physician Assistants and Nurse Practitioners) who all work together to provide you with the care you need, when you need it.  We recommend signing up for the patient portal called "MyChart".  Sign up information is provided on this After Visit Summary.  MyChart is used to connect with patients for Virtual Visits (Telemedicine).  Patients are able to view lab/test results, encounter notes, upcoming appointments, etc.  Non-urgent messages can be sent to your provider as well.   To learn more about what you can do with MyChart, go to [email or web address].    Your next appointment:   1 year(s)  Provider:   Peter Martinique, MD

## 2022-11-06 DIAGNOSIS — G4761 Periodic limb movement disorder: Secondary | ICD-10-CM | POA: Diagnosis not present

## 2022-11-06 DIAGNOSIS — G2581 Restless legs syndrome: Secondary | ICD-10-CM | POA: Diagnosis not present

## 2022-11-12 ENCOUNTER — Encounter: Payer: Self-pay | Admitting: Cardiology

## 2022-11-13 ENCOUNTER — Telehealth: Payer: Self-pay | Admitting: Cardiology

## 2022-11-13 MED ORDER — SPIRONOLACTONE 25 MG PO TABS: 25.0000 mg | ORAL_TABLET | Freq: Every day | ORAL | 3 refills | Status: DC

## 2022-11-13 NOTE — Telephone Encounter (Signed)
Spoke with pt reports SOB for several days but it's gotten worst the past few days. Pt states his PCP took him off several medications. Dr. Martinique is up to date on his medications. He wants to know if the swelling is from stopping these medications. Pt reports having a line around his leg from his socks, "that is a new thing." Today 153.5 pounds, "it has just been gradually increasing. The weight has not been going down any at all." BP readings 114/72, 126/67. "I am okay to wait it out if he thinks that what I need to do, I'm just uncomfortable."

## 2022-11-13 NOTE — Telephone Encounter (Signed)
Pt c/o Shortness Of Breath: STAT if SOB developed within the last 24 hours or pt is noticeably SOB on the phone  1. Are you currently SOB (can you hear that pt is SOB on the phone)? Yes   2. How long have you been experiencing SOB? Few days  3. Are you SOB when sitting or when up moving around? Both   4. Are you currently experiencing any other symptoms? Swelling in LE and ring finger

## 2022-11-13 NOTE — Telephone Encounter (Signed)
Returned call to pt, he states that he has taken this medication before. Looked in chart and pt was taking spiro-HCTZ. Sent new rx to requested pharmacy. Verbalized understanding. He will weigh daily and call back with any issues

## 2022-11-18 NOTE — Telephone Encounter (Signed)
Attempted to call. No answer.

## 2022-11-19 DIAGNOSIS — E10319 Type 1 diabetes mellitus with unspecified diabetic retinopathy without macular edema: Secondary | ICD-10-CM | POA: Diagnosis not present

## 2022-11-19 DIAGNOSIS — E039 Hypothyroidism, unspecified: Secondary | ICD-10-CM | POA: Diagnosis not present

## 2022-11-19 DIAGNOSIS — Z9641 Presence of insulin pump (external) (internal): Secondary | ICD-10-CM | POA: Diagnosis not present

## 2023-02-04 ENCOUNTER — Other Ambulatory Visit: Payer: Self-pay | Admitting: Internal Medicine

## 2023-02-24 ENCOUNTER — Telehealth: Payer: Self-pay | Admitting: Cardiology

## 2023-02-24 MED ORDER — BENAZEPRIL HCL 10 MG PO TABS: 5.0000 mg | ORAL_TABLET | Freq: Every day | ORAL | 3 refills | Status: DC

## 2023-02-24 NOTE — Telephone Encounter (Signed)
*  STAT* If patient is at the pharmacy, call can be transferred to refill team.   1. Which medications need to be refilled? (please list name of each medication and dose if known)   benazepril (LOTENSIN) 10 MG tablet   2. Which pharmacy/location (including street and city if local pharmacy) is medication to be sent to?  [id number] - [place], [place] - [address]   3. Do they need a 30 day or 90 day supply?   90 day  Patient wants to get prescription in 5 mg tablet so he will not have to cut the tablet in half.  Patient stated he has 3 days left of this medication.

## 2023-03-30 ENCOUNTER — Encounter (HOSPITAL_COMMUNITY): Payer: Self-pay

## 2023-03-30 ENCOUNTER — Observation Stay (HOSPITAL_COMMUNITY)
Admission: EM | Admit: 2023-03-30 | Discharge: 2023-04-03 | Disposition: A | Discharge status: Home / Self Care | Payer: Medicare PPO | Attending: Internal Medicine | Admitting: Internal Medicine

## 2023-03-30 ENCOUNTER — Other Ambulatory Visit: Payer: Self-pay

## 2023-03-30 ENCOUNTER — Emergency Department (HOSPITAL_COMMUNITY): Payer: Medicare PPO

## 2023-03-30 DIAGNOSIS — I5032 Chronic diastolic (congestive) heart failure: Secondary | ICD-10-CM | POA: Insufficient documentation

## 2023-03-30 DIAGNOSIS — I110 Hypertensive heart disease with heart failure: Secondary | ICD-10-CM | POA: Diagnosis not present

## 2023-03-30 DIAGNOSIS — K8012 Calculus of gallbladder with acute and chronic cholecystitis without obstruction: Principal | ICD-10-CM | POA: Insufficient documentation

## 2023-03-30 DIAGNOSIS — Z7982 Long term (current) use of aspirin: Secondary | ICD-10-CM | POA: Insufficient documentation

## 2023-03-30 DIAGNOSIS — E039 Hypothyroidism, unspecified: Secondary | ICD-10-CM | POA: Diagnosis not present

## 2023-03-30 DIAGNOSIS — G2581 Restless legs syndrome: Secondary | ICD-10-CM | POA: Insufficient documentation

## 2023-03-30 DIAGNOSIS — Z87891 Personal history of nicotine dependence: Secondary | ICD-10-CM | POA: Insufficient documentation

## 2023-03-30 DIAGNOSIS — R1011 Right upper quadrant pain: Principal | ICD-10-CM

## 2023-03-30 DIAGNOSIS — E119 Type 2 diabetes mellitus without complications: Secondary | ICD-10-CM | POA: Diagnosis not present

## 2023-03-30 DIAGNOSIS — E876 Hypokalemia: Secondary | ICD-10-CM | POA: Diagnosis not present

## 2023-03-30 DIAGNOSIS — Z794 Long term (current) use of insulin: Secondary | ICD-10-CM | POA: Diagnosis not present

## 2023-03-30 DIAGNOSIS — Z79899 Other long term (current) drug therapy: Secondary | ICD-10-CM | POA: Insufficient documentation

## 2023-03-30 DIAGNOSIS — E785 Hyperlipidemia, unspecified: Secondary | ICD-10-CM | POA: Diagnosis present

## 2023-03-30 DIAGNOSIS — I10 Essential (primary) hypertension: Secondary | ICD-10-CM | POA: Diagnosis present

## 2023-03-30 DIAGNOSIS — K810 Acute cholecystitis: Secondary | ICD-10-CM

## 2023-03-30 LAB — URINALYSIS, ROUTINE W REFLEX MICROSCOPIC
Bilirubin Urine: NEGATIVE
Glucose, UA: NEGATIVE mg/dL
Hgb urine dipstick: NEGATIVE
Ketones, ur: NEGATIVE mg/dL
Leukocytes,Ua: NEGATIVE
Nitrite: NEGATIVE
Protein, ur: NEGATIVE mg/dL
Specific Gravity, Urine: 1.017 (ref 1.005–1.030)
pH: 6 (ref 5.0–8.0)

## 2023-03-30 LAB — COMPREHENSIVE METABOLIC PANEL
ALT: 36 U/L (ref 0–44)
AST: 52 U/L — ABNORMAL HIGH (ref 15–41)
Albumin: 4 g/dL (ref 3.5–5.0)
Alkaline Phosphatase: 118 U/L (ref 38–126)
Anion gap: 7 (ref 5–15)
BUN: 11 mg/dL (ref 8–23)
CO2: 27 mmol/L (ref 22–32)
Calcium: 8.6 mg/dL — ABNORMAL LOW (ref 8.9–10.3)
Chloride: 105 mmol/L (ref 98–111)
Creatinine, Ser: 0.88 mg/dL (ref 0.61–1.24)
GFR, Estimated: >60 mL/min (ref >60)
Glucose, Bld: 92 mg/dL (ref 70–99)
Potassium: 3.4 mmol/L — ABNORMAL LOW (ref 3.5–5.1)
Sodium: 139 mmol/L (ref 135–145)
Total Bilirubin: 2.5 mg/dL — ABNORMAL HIGH (ref 0.3–1.2)
Total Protein: 7.5 g/dL (ref 6.5–8.1)

## 2023-03-30 LAB — CBC
HCT: 49.1 % (ref 39.0–52.0)
Hemoglobin: 17 g/dL (ref 13.0–17.0)
MCH: 31.1 pg (ref 26.0–34.0)
MCHC: 34.6 g/dL (ref 30.0–36.0)
MCV: 89.9 fL (ref 80.0–100.0)
Platelets: 242 10*3/uL (ref 150–400)
RBC: 5.46 MIL/uL (ref 4.22–5.81)
RDW: 13.4 % (ref 11.5–15.5)
WBC: 9.9 10*3/uL (ref 4.0–10.5)
nRBC: 0 % (ref 0.0–0.2)

## 2023-03-30 LAB — LIPASE, BLOOD: Lipase: 23 U/L (ref 11–51)

## 2023-03-30 MED ORDER — PRAVASTATIN SODIUM 40 MG PO TABS
40.0000 mg | ORAL_TABLET | Freq: Every day | ORAL | Status: DC
  Administered 2023-03-31 – 2023-04-03 (×4): 40 mg via ORAL
  Filled 2023-03-30 (×4): qty 1

## 2023-03-30 MED ORDER — ACETAMINOPHEN 650 MG RE SUPP: 650.0000 mg | Freq: Four times a day (QID) | RECTAL | Status: DC | PRN

## 2023-03-30 MED ORDER — METOPROLOL SUCCINATE ER 25 MG PO TB24
25.0000 mg | ORAL_TABLET | Freq: Every day | ORAL | Status: DC
  Administered 2023-03-31 – 2023-04-03 (×4): 25 mg via ORAL
  Filled 2023-03-30 (×4): qty 1

## 2023-03-30 MED ORDER — SODIUM CHLORIDE 0.9 % IV SOLN: INTRAVENOUS | Status: DC

## 2023-03-30 MED ORDER — BENAZEPRIL HCL 5 MG PO TABS
5.0000 mg | ORAL_TABLET | Freq: Every day | ORAL | Status: DC
  Administered 2023-03-31 – 2023-04-03 (×4): 5 mg via ORAL
  Filled 2023-03-30 (×4): qty 1

## 2023-03-30 MED ORDER — INSULIN ASPART 100 UNIT/ML IJ SOLN: 0.0000 [IU] | Freq: Three times a day (TID) | INTRAMUSCULAR | Status: DC

## 2023-03-30 MED ORDER — MELATONIN 3 MG PO TABS
6.0000 mg | ORAL_TABLET | Freq: Every day | ORAL | Status: DC
  Administered 2023-03-31 – 2023-04-02 (×4): 6 mg via ORAL
  Filled 2023-03-30 (×4): qty 2

## 2023-03-30 MED ORDER — ONDANSETRON HCL 4 MG PO TABS
4.0000 mg | ORAL_TABLET | Freq: Four times a day (QID) | ORAL | Status: DC | PRN
  Administered 2023-04-02: 4 mg via ORAL
  Filled 2023-03-30: qty 1

## 2023-03-30 MED ORDER — INSULIN ASPART 100 UNIT/ML IJ SOLN: 0.0000 [IU] | Freq: Every day | INTRAMUSCULAR | Status: DC

## 2023-03-30 MED ORDER — ONDANSETRON HCL 4 MG/2ML IJ SOLN: 4.0000 mg | Freq: Four times a day (QID) | INTRAMUSCULAR | Status: DC | PRN

## 2023-03-30 MED ORDER — IOHEXOL 300 MG/ML  SOLN
100.0000 mL | Freq: Once | INTRAMUSCULAR | Status: AC | PRN
  Administered 2023-03-30: 100 mL via INTRAVENOUS

## 2023-03-30 MED ORDER — LEVOTHYROXINE SODIUM 75 MCG PO TABS: 150.0000 ug | ORAL_TABLET | Freq: Every day | ORAL | Status: DC

## 2023-03-30 MED ORDER — OXYCODONE HCL 5 MG PO TABS
5.0000 mg | ORAL_TABLET | ORAL | Status: DC | PRN
  Administered 2023-03-31 – 2023-04-02 (×2): 5 mg via ORAL
  Filled 2023-03-30 (×2): qty 1

## 2023-03-30 MED ORDER — PANTOPRAZOLE SODIUM 40 MG PO TBEC
40.0000 mg | DELAYED_RELEASE_TABLET | Freq: Every day | ORAL | Status: DC
  Administered 2023-03-31 – 2023-04-03 (×4): 40 mg via ORAL
  Filled 2023-03-30 (×4): qty 1

## 2023-03-30 MED ORDER — SPIRONOLACTONE 25 MG PO TABS
25.0000 mg | ORAL_TABLET | Freq: Every day | ORAL | Status: DC
  Administered 2023-03-31 – 2023-04-03 (×4): 25 mg via ORAL
  Filled 2023-03-30 (×4): qty 1

## 2023-03-30 MED ORDER — ACETAMINOPHEN 325 MG PO TABS: 650.0000 mg | ORAL_TABLET | Freq: Four times a day (QID) | ORAL | Status: DC | PRN

## 2023-03-30 MED ORDER — MORPHINE SULFATE (PF) 2 MG/ML IV SOLN: 2.0000 mg | INTRAVENOUS | Status: DC | PRN

## 2023-03-30 NOTE — ED Notes (Signed)
ED TO INPATIENT HANDOFF REPORT  ED Nurse Name and Phone #: Lowanda Foster [phone]  S Name/Age/Gender Andrew Oconnor 67 y.o. male Room/Bed: APA08/APA08  Code Status   Code Status: Prior  Home/SNF/Other Home Patient oriented to: self, place, time, and situation Is this baseline? Yes   Triage Complete: Triage complete  Chief Complaint Acute cholecystitis [K81.0]  Triage Note RUQ pain x2 hours with nausea  States that he is peeing and pooping normally    Allergies Allergies  Allergen Reactions   Clonazepam Other (See Comments)    "It makes him go crazy"   Donepezil Other (See Comments)   Gabapentin     Other Reaction(s): Other (See Comments)  Mood swing   Ropinirole    Ropinirole Hcl Other (See Comments)    Level of Care/Admitting Diagnosis ED Disposition     ED Disposition  Admit   Condition  --   Comment  Hospital Area: [facility] [[id number]]  Level of Care: Med-Surg [16]  Covid Evaluation: Asymptomatic - no recent exposure (last 10 days) testing not required  Diagnosis: Acute cholecystitis [575.0.ICD-9-CM]  Admitting Physician: Lilyan Gilford [[id number]]  Attending Physician: Lilyan Gilford [[id number]]          B Medical/Surgery History Past Medical History:  Diagnosis Date   Allergy    Anxiety    Carpal tunnel syndrome    Cataract    CHF (congestive heart failure) (HCC)    Colon polyp    Depression    Diabetes mellitus without complication (HCC)    Erectile dysfunction    GERD (gastroesophageal reflux disease)    Hiatal hernia    History of diplopia     third nerve palsy 2013    Hyperlipidemia    Hypertension    Obesity    Sleep apnea    Sleep apnea with use of continuous positive airway pressure (CPAP)    diagnsed AHi of 20 in 2010 , titrated to 13 cm H20.   Thyroid disorder    Past Surgical History:  Procedure Laterality Date   CATARACT EXTRACTION W/PHACO Right 10/09/2015   Procedure: CATARACT EXTRACTION PHACO AND  INTRAOCULAR LENS PLACEMENT (IOC);  Surgeon: Susa Simmonds, MD;  Location: [facility];  Service: Ophthalmology;  Laterality: Right;  CDE:2.17   HAND SURGERY Bilateral    secondary to nerve damage   hemorrectomy     LEFT HEART CATHETERIZATION WITH CORONARY ANGIOGRAM N/A 11/24/2014   Procedure: LEFT HEART CATHETERIZATION WITH CORONARY ANGIOGRAM;  Surgeon: Peter M Swaziland, MD;  Location[facility] CATH LAB;  Service: Cardiovascular;  Laterality: N/A;   LESION REMOVAL N/A 05/04/2021   Procedure: EXCISION OF CHRONIC SUPRAPUBIC ABSCESS;  Surgeon: Gaynelle Adu, MD;  Location: [facility];  Service: General;  Laterality: N/A;     A IV Location/Drains/Wounds Patient Lines/Drains/Airways Status     Active Line/Drains/Airways     Name Placement date Placement time Site Days   Peripheral IV 03/30/23 20 G Right Antecubital 03/30/23  1950  Antecubital  less than 1   External Urinary Catheter 09/25/22  1409  --  186            Intake/Output Last 24 hours No intake or output data in the 24 hours ending 03/30/23 2228  Labs/Imaging Results for orders placed or performed during the hospital encounter of 03/30/23 (from the past 48 hour(s))  Lipase, blood     Status: None   Collection Time: 03/30/23  7:40 PM  Result Value Ref Range  Lipase 23 11 - 51 U/L    Comment: Performed at [facility], [address], [place], [place] [address]  Comprehensive metabolic panel     Status: Abnormal   Collection Time: 03/30/23  7:40 PM  Result Value Ref Range   Sodium 139 135 - 145 mmol/L   Potassium 3.4 (L) 3.5 - 5.1 mmol/L   Chloride 105 98 - 111 mmol/L   CO2 27 22 - 32 mmol/L   Glucose, Bld 92 70 - 99 mg/dL    Comment: Glucose reference range applies only to samples taken after fasting for at least 8 hours.   BUN 11 8 - 23 mg/dL   Creatinine, Ser 6.04 0.61 - 1.24 mg/dL   Calcium 8.6 (L) 8.9 - 10.3 mg/dL   Total Protein 7.5 6.5 - 8.1 g/dL   Albumin 4.0 3.5 - 5.0 g/dL   AST 52 (H) 15 - 41 U/L    ALT 36 0 - 44 U/L   Alkaline Phosphatase 118 38 - 126 U/L   Total Bilirubin 2.5 (H) 0.3 - 1.2 mg/dL   GFR, Estimated >54 >09 mL/min    Comment: (NOTE) Calculated using the CKD-EPI Creatinine Equation (2021)    Anion gap 7 5 - 15    Comment: Performed at [facility], [address], [place], [place] [address]  CBC     Status: None   Collection Time: 03/30/23  7:40 PM  Result Value Ref Range   WBC 9.9 4.0 - 10.5 K/uL   RBC 5.46 4.22 - 5.81 MIL/uL   Hemoglobin 17.0 13.0 - 17.0 g/dL   HCT 47.8 29.5 - 62.1 %   MCV 89.9 80.0 - 100.0 fL   MCH 31.1 26.0 - 34.0 pg   MCHC 34.6 30.0 - 36.0 g/dL   RDW 30.8 65.7 - 84.6 %   Platelets 242 150 - 400 K/uL   nRBC 0.0 0.0 - 0.2 %    Comment: Performed at [facility], [address] [place], [place] [address]  Urinalysis, Routine w reflex microscopic -Urine, Clean Catch     Status: None   Collection Time: 03/30/23  7:51 PM  Result Value Ref Range   Color, Urine YELLOW YELLOW   APPearance CLEAR CLEAR   Specific Gravity, Urine 1.017 1.005 - 1.030   pH 6.0 5.0 - 8.0   Glucose, UA NEGATIVE NEGATIVE mg/dL   Hgb urine dipstick NEGATIVE NEGATIVE   Bilirubin Urine NEGATIVE NEGATIVE   Ketones, ur NEGATIVE NEGATIVE mg/dL   Protein, ur NEGATIVE NEGATIVE mg/dL   Nitrite NEGATIVE NEGATIVE   Leukocytes,Ua NEGATIVE NEGATIVE    Comment: Performed at [facility], [address], [place], [place] [address]   CT ABDOMEN PELVIS W CONTRAST  Result Date: 03/30/2023 CLINICAL DATA:  Epigastric pain. EXAM: CT ABDOMEN AND PELVIS WITH CONTRAST TECHNIQUE: Multidetector CT imaging of the abdomen and pelvis was performed using the standard protocol following bolus administration of intravenous contrast. RADIATION DOSE REDUCTION: This exam was performed according to the departmental dose-optimization program which includes automated exposure control, adjustment of the mA and/or kV according to patient size and/or use of iterative reconstruction technique. CONTRAST:   OMNIPAQUE IOHEXOL 300 MG/ML  SOLN COMPARISON:  None Available. FINDINGS: Lower chest: The visualized lung bases are clear. No intra-abdominal free air or free fluid. Hepatobiliary: There is a 1.3 cm hypoenhancing lesion in the left lobe of the liver which is not characterized on this CT but likely represents a flash filling hemangioma or a portal venous shunting.  This likely corresponds to the hypoechoic lesion seen on the ultrasound of 04/21/2013. Further characterization with MRI without and with contrast on a nonemergent/outpatient basis recommended. No biliary dilatation. There multiple stones within the gallbladder. There is mild haziness of the gallbladder wall with trace pericholecystic fluid concerning for acute cholecystitis. Further evaluation with ultrasound is recommended. Pancreas: Unremarkable. No pancreatic ductal dilatation or surrounding inflammatory changes. Spleen: Normal in size without focal abnormality. Adrenals/Urinary Tract: The adrenal glands are unremarkable. The kidneys, visualized ureters, and urinary bladder appear unremarkable. Stomach/Bowel: There is scattered colonic diverticula without active inflammatory changes. There is enhancement of the gastric mucosa, likely physiologic. Gastritis is less likely. Clinical correlation is recommended. There is no bowel obstruction. The appendix is normal. Vascular/Lymphatic: Mild aortoiliac atherosclerotic disease. The IVC is unremarkable. No portal venous gas. There is no adenopathy. Reproductive: The prostate and seminal vesicles are grossly unremarkable. No pelvic mass. Other: Small fat containing bilateral inguinal hernias. Musculoskeletal: Degenerative changes of the spine. No acute osseous pathology. IMPRESSION: 1. Cholelithiasis with findings concerning for acute cholecystitis. Further evaluation with ultrasound is recommended. 2. Colonic diverticulosis. No bowel obstruction. Normal appendix. 3. A 1.3 cm hypoenhancing lesion in the left  lobe of the liver, likely a flash filling hemangioma or a portal venous shunting. Further characterization with MRI without and with contrast on a nonemergent/outpatient basis recommended. 4.  Aortic Atherosclerosis (ICD10-I70.0). Electronically Signed   By: Elgie Collard M.D.   On: 03/30/2023 21:47    Pending Labs Unresulted Labs (From admission, onward)    None       Vitals/Pain Today's Vitals   03/30/23 1935 03/30/23 2045 03/30/23 2100 03/30/23 2115  BP: (!) 182/76 (!) 151/60 138/65 133/63  Pulse: (!) 52 65 (!) 52 (!) 51  Resp: 18 18 13 12   Temp: 98.4 F (36.9 C)     TempSrc: Oral     SpO2: 100% 97% 98% 99%  Weight:      Height:      PainSc:        Isolation Precautions No active isolations  Medications Medications  iohexol (OMNIPAQUE) 300 MG/ML solution 100 mL (100 mLs Intravenous Contrast Given 03/30/23 2125)    Mobility walks     Focused Assessments Cardiac Assessment Handoff:  Cardiac Rhythm: Sinus bradycardia (rate 52bpm) Lab Results  Component Value Date   TROPONINI <0.03 11/24/2014   Lab Results  Component Value Date   DDIMER 0.30 11/23/2014      R Recommendations: See Admitting Provider Note  Report given to:   Additional Notes: Uses urinal. Walks well. Independent with ADLs.

## 2023-03-30 NOTE — ED Triage Notes (Signed)
RUQ pain x2 hours with nausea  States that he is peeing and pooping normally

## 2023-03-30 NOTE — ED Notes (Signed)
Pt called out reporting increased nausea and sudden diaphoresis. EKG performed and EDP notified.

## 2023-03-30 NOTE — ED Provider Notes (Signed)
[place] EMERGENCY DEPARTMENT AT [facility] Provider Note   CSN: [id number] Arrival date & time: 03/30/23  1930     History  Chief Complaint  Patient presents with   Abdominal Pain    Andrew Oconnor is a 67 y.o. male.   Abdominal Pain Patient presents with right-sided or right upper quadrant abdominal pain.  Began around 2 to 3 hours prior to arrival.  Started and then ate some food and did not change it.  No dysuria.  No diarrhea or constipation.  Has had an episode of to like this before but it always resolved.  Pain is dull.    Past Medical History:  Diagnosis Date   Allergy    Anxiety    Carpal tunnel syndrome    Cataract    CHF (congestive heart failure) (HCC)    Colon polyp    Depression    Diabetes mellitus without complication (HCC)    Erectile dysfunction    GERD (gastroesophageal reflux disease)    Hiatal hernia    History of diplopia     third nerve palsy 2013    Hyperlipidemia    Hypertension    Obesity    Sleep apnea    Sleep apnea with use of continuous positive airway pressure (CPAP)    diagnsed AHi of 20 in 2010 , titrated to 13 cm H20.   Thyroid disorder     Home Medications Prior to Admission medications   Medication Sig Start Date End Date Taking? Authorizing Provider  aspirin EC 81 MG tablet Take 1 tablet (81 mg total) by mouth daily. Swallow whole. 10/30/21   [place], Peter M, MD  benazepril (LOTENSIN) 10 MG tablet Take 0.5 tablets (5 mg total) by mouth daily. Take 1/2 tablet by mouth daily. 02/24/23   [place], Peter M, MD  Blood Glucose Calibration (OT ULTRA/FASTTK CNTRL SOLN) SOLN  03/16/14   [provider]  clonazePAM (KLONOPIN) 0.5 MG tablet Take 0.5 mg by mouth daily as needed for anxiety. 09/18/22   [provider]  insulin lispro (HUMALOG) 100 UNIT/ML cartridge Inject 80 Units into the skin daily.    [provider]  levothyroxine (SYNTHROID, LEVOTHROID) 150 MCG tablet Take 150 mcg by mouth daily  before breakfast.  08/02/15   [provider]  metoprolol succinate (TOPROL-XL) 25 MG 24 hr tablet Take 1 tablet (25 mg total) by mouth daily. 11/05/22   [place], Peter M, MD  omeprazole (PRILOSEC) 40 MG capsule Take 40 mg by mouth daily. 07/26/16   [provider]  pravastatin (PRAVACHOL) 40 MG tablet Take 40 mg by mouth daily.  01/16/13   [provider]  spironolactone (ALDACTONE) 25 MG tablet Take 1 tablet (25 mg total) by mouth daily. 02/04/23 06/04/23  [place], Peter M, MD      Allergies    Clonazepam, Donepezil, Gabapentin, Ropinirole, and Ropinirole hcl    Review of Systems   Review of Systems  Gastrointestinal:  Positive for abdominal pain.    Physical Exam Updated Vital Signs BP 138/65   Pulse (!) 52   Temp 98.4 F (36.9 C) (Oral)   Resp 13   Ht 4\' 7"  (1.397 m)   Wt 67.1 kg   SpO2 98%   BMI 34.40 kg/m  Physical Exam Vitals and nursing note reviewed.  Cardiovascular:     Rate and Rhythm: Regular rhythm.  Abdominal:     Tenderness: There is abdominal tenderness.     Hernia: No hernia is present.  Comments: Mild right upper quadrant tenderness.  No rebound or guarding.  No hernia palpated.  Neurological:     Mental Status: He is alert.     ED Results / Procedures / Treatments   Labs (all labs ordered are listed, but only abnormal results are displayed) Labs Reviewed  COMPREHENSIVE METABOLIC PANEL - Abnormal; Notable for the following components:      Result Value   Potassium 3.4 (*)    Calcium 8.6 (*)    AST 52 (*)    Total Bilirubin 2.5 (*)    All other components within normal limits  LIPASE, BLOOD  CBC  URINALYSIS, ROUTINE W REFLEX MICROSCOPIC    EKG None  Radiology CT ABDOMEN PELVIS W CONTRAST  Result Date: 03/30/2023 CLINICAL DATA:  Epigastric pain. EXAM: CT ABDOMEN AND PELVIS WITH CONTRAST TECHNIQUE: Multidetector CT imaging of the abdomen and pelvis was performed using the standard protocol following bolus  administration of intravenous contrast. RADIATION DOSE REDUCTION: This exam was performed according to the departmental dose-optimization program which includes automated exposure control, adjustment of the mA and/or kV according to patient size and/or use of iterative reconstruction technique. CONTRAST:  OMNIPAQUE IOHEXOL 300 MG/ML  SOLN COMPARISON:  None Available. FINDINGS: Lower chest: The visualized lung bases are clear. No intra-abdominal free air or free fluid. Hepatobiliary: There is a 1.3 cm hypoenhancing lesion in the left lobe of the liver which is not characterized on this CT but likely represents a flash filling hemangioma or a portal venous shunting. This likely corresponds to the hypoechoic lesion seen on the ultrasound of 04/21/2013. Further characterization with MRI without and with contrast on a nonemergent/outpatient basis recommended. No biliary dilatation. There multiple stones within the gallbladder. There is mild haziness of the gallbladder wall with trace pericholecystic fluid concerning for acute cholecystitis. Further evaluation with ultrasound is recommended. Pancreas: Unremarkable. No pancreatic ductal dilatation or surrounding inflammatory changes. Spleen: Normal in size without focal abnormality. Adrenals/Urinary Tract: The adrenal glands are unremarkable. The kidneys, visualized ureters, and urinary bladder appear unremarkable. Stomach/Bowel: There is scattered colonic diverticula without active inflammatory changes. There is enhancement of the gastric mucosa, likely physiologic. Gastritis is less likely. Clinical correlation is recommended. There is no bowel obstruction. The appendix is normal. Vascular/Lymphatic: Mild aortoiliac atherosclerotic disease. The IVC is unremarkable. No portal venous gas. There is no adenopathy. Reproductive: The prostate and seminal vesicles are grossly unremarkable. No pelvic mass. Other: Small fat containing bilateral inguinal hernias.  Musculoskeletal: Degenerative changes of the spine. No acute osseous pathology. IMPRESSION: 1. Cholelithiasis with findings concerning for acute cholecystitis. Further evaluation with ultrasound is recommended. 2. Colonic diverticulosis. No bowel obstruction. Normal appendix. 3. A 1.3 cm hypoenhancing lesion in the left lobe of the liver, likely a flash filling hemangioma or a portal venous shunting. Further characterization with MRI without and with contrast on a nonemergent/outpatient basis recommended. 4.  Aortic Atherosclerosis (ICD10-I70.0). Electronically Signed   By: Elgie Collard M.D.   On: 03/30/2023 21:47    Procedures Procedures    Medications Ordered in ED Medications  iohexol (OMNIPAQUE) 300 MG/ML solution 100 mL (100 mLs Intravenous Contrast Given 03/30/23 2125)    ED Course/ Medical Decision Making/ A&P                             Medical Decision Making Amount and/or Complexity of Data Reviewed Labs: ordered. Radiology: ordered.  Risk Prescription drug management.   Patient with epigastric  right upper quadrant tenderness.  Does have pain.  Differential diagnosis includes biliary disease, AAA, gastritis.  Will get basic blood work and CT scan to evaluate.  Reviewed ultrasound from 10 years ago did not have any biliary disease.  CMP shows an AST of 52 and bilirubin elevated at 2.5.  Does have some tenderness in right upper quadrant.  CT scan shows potential cholelithiasis with possible cholecystitis.  Discussed with Dr. Franky Macho from general surgery.  With bilirubin elevated would rather have medicine admission for further workup with ultrasound and potentially MRCP depending on ultrasound.  N.p.o. at midnight.  No OR tonight.  Will discuss with hospitalist.       Final Clinical Impression(s) / ED Diagnoses Final diagnoses:  Right upper quadrant abdominal pain    Rx / DC Orders ED Discharge Orders     None         Benjiman Core, MD 03/30/23  2345

## 2023-03-31 ENCOUNTER — Observation Stay (HOSPITAL_COMMUNITY): Payer: Medicare PPO

## 2023-03-31 ENCOUNTER — Observation Stay (HOSPITAL_BASED_OUTPATIENT_CLINIC_OR_DEPARTMENT_OTHER): Payer: Medicare PPO

## 2023-03-31 DIAGNOSIS — I10 Essential (primary) hypertension: Secondary | ICD-10-CM

## 2023-03-31 DIAGNOSIS — G2581 Restless legs syndrome: Secondary | ICD-10-CM | POA: Insufficient documentation

## 2023-03-31 DIAGNOSIS — E119 Type 2 diabetes mellitus without complications: Secondary | ICD-10-CM

## 2023-03-31 DIAGNOSIS — K810 Acute cholecystitis: Secondary | ICD-10-CM | POA: Diagnosis not present

## 2023-03-31 DIAGNOSIS — E039 Hypothyroidism, unspecified: Secondary | ICD-10-CM

## 2023-03-31 DIAGNOSIS — I5032 Chronic diastolic (congestive) heart failure: Secondary | ICD-10-CM | POA: Diagnosis not present

## 2023-03-31 DIAGNOSIS — Z01818 Encounter for other preprocedural examination: Secondary | ICD-10-CM

## 2023-03-31 DIAGNOSIS — E785 Hyperlipidemia, unspecified: Secondary | ICD-10-CM

## 2023-03-31 DIAGNOSIS — E876 Hypokalemia: Secondary | ICD-10-CM | POA: Insufficient documentation

## 2023-03-31 LAB — COMPREHENSIVE METABOLIC PANEL
ALT: 37 U/L (ref 0–44)
AST: 33 U/L (ref 15–41)
Albumin: 3.2 g/dL — ABNORMAL LOW (ref 3.5–5.0)
Alkaline Phosphatase: 101 U/L (ref 38–126)
Anion gap: 7 (ref 5–15)
BUN: 12 mg/dL (ref 8–23)
CO2: 22 mmol/L (ref 22–32)
Calcium: 8 mg/dL — ABNORMAL LOW (ref 8.9–10.3)
Chloride: 110 mmol/L (ref 98–111)
Creatinine, Ser: 0.78 mg/dL (ref 0.61–1.24)
GFR, Estimated: >60 mL/min (ref >60)
Glucose, Bld: 77 mg/dL (ref 70–99)
Potassium: 3.5 mmol/L (ref 3.5–5.1)
Sodium: 139 mmol/L (ref 135–145)
Total Bilirubin: 2 mg/dL — ABNORMAL HIGH (ref 0.3–1.2)
Total Protein: 6.1 g/dL — ABNORMAL LOW (ref 6.5–8.1)

## 2023-03-31 LAB — CBC WITH DIFFERENTIAL/PLATELET
Abs Immature Granulocytes: 0.03 10*3/uL (ref 0.00–0.07)
Basophils Absolute: 0 10*3/uL (ref 0.0–0.1)
Basophils Relative: 0 %
Eosinophils Absolute: 0.2 10*3/uL (ref 0.0–0.5)
Eosinophils Relative: 2 %
HCT: 41 % (ref 39.0–52.0)
Hemoglobin: 14.5 g/dL (ref 13.0–17.0)
Immature Granulocytes: 0 %
Lymphocytes Relative: 25 %
Lymphs Abs: 2.6 10*3/uL (ref 0.7–4.0)
MCH: 31.5 pg (ref 26.0–34.0)
MCHC: 35.4 g/dL (ref 30.0–36.0)
MCV: 88.9 fL (ref 80.0–100.0)
Monocytes Absolute: 0.7 10*3/uL (ref 0.1–1.0)
Monocytes Relative: 7 %
Neutro Abs: 7.1 10*3/uL (ref 1.7–7.7)
Neutrophils Relative %: 66 %
Platelets: 202 10*3/uL (ref 150–400)
RBC: 4.61 MIL/uL (ref 4.22–5.81)
RDW: 13.2 % (ref 11.5–15.5)
WBC: 10.7 10*3/uL — ABNORMAL HIGH (ref 4.0–10.5)
nRBC: 0 % (ref 0.0–0.2)

## 2023-03-31 LAB — GLUCOSE, CAPILLARY
Glucose-Capillary: 78 mg/dL (ref 70–99)
Glucose-Capillary: 203 mg/dL — ABNORMAL HIGH (ref 70–99)
Glucose-Capillary: 348 mg/dL — ABNORMAL HIGH (ref 70–99)
Glucose-Capillary: 298 mg/dL — ABNORMAL HIGH (ref 70–99)
Glucose-Capillary: 100 mg/dL — ABNORMAL HIGH (ref 70–99)

## 2023-03-31 LAB — ECHOCARDIOGRAM COMPLETE
Area-P 1/2: 4.31 cm2
Calc EF: 71.1 %
Height: 55 in
S' Lateral: 2.6 cm
Single Plane A2C EF: 70.5 %
Single Plane A4C EF: 71.2 %
Weight: 2368 oz

## 2023-03-31 LAB — HEMOGLOBIN A1C
Hgb A1c MFr Bld: 5.1 % (ref 4.8–5.6)
Mean Plasma Glucose: 99.67 mg/dL

## 2023-03-31 LAB — MAGNESIUM: Magnesium: 1.8 mg/dL (ref 1.7–2.4)

## 2023-03-31 LAB — URINALYSIS, COMPLETE (UACMP) WITH MICROSCOPIC
Bacteria, UA: NONE SEEN
Bilirubin Urine: NEGATIVE
Glucose, UA: NEGATIVE mg/dL
Hgb urine dipstick: NEGATIVE
Ketones, ur: NEGATIVE mg/dL
Leukocytes,Ua: NEGATIVE
Nitrite: NEGATIVE
Protein, ur: NEGATIVE mg/dL
Specific Gravity, Urine: 1.043 — ABNORMAL HIGH (ref 1.005–1.030)
pH: 7 (ref 5.0–8.0)

## 2023-03-31 LAB — HIV ANTIBODY (ROUTINE TESTING W REFLEX): HIV Screen 4th Generation wRfx: NONREACTIVE

## 2023-03-31 LAB — TROPONIN I (HIGH SENSITIVITY): Troponin I (High Sensitivity): 6 ng/L (ref <18)

## 2023-03-31 MED ORDER — LEVOTHYROXINE SODIUM 25 MCG PO TABS
125.0000 ug | ORAL_TABLET | Freq: Every day | ORAL | Status: DC
  Administered 2023-03-31 – 2023-04-03 (×4): 125 ug via ORAL
  Filled 2023-03-31 (×4): qty 1

## 2023-03-31 MED ORDER — POTASSIUM CHLORIDE 10 MEQ/100ML IV SOLN
10.0000 meq | INTRAVENOUS | Status: AC
  Administered 2023-03-31 (×2): 10 meq via INTRAVENOUS
  Filled 2023-03-31 (×2): qty 100

## 2023-03-31 MED ORDER — GADOBUTROL 1 MMOL/ML IV SOLN
7.0000 mL | Freq: Once | INTRAVENOUS | Status: AC | PRN
  Administered 2023-03-31: 7 mL via INTRAVENOUS

## 2023-03-31 MED ORDER — CARBIDOPA-LEVODOPA ER 25-100 MG PO TBCR
1.0000 | EXTENDED_RELEASE_TABLET | Freq: Two times a day (BID) | ORAL | Status: DC
  Administered 2023-04-01 – 2023-04-02 (×4): 1 via ORAL
  Filled 2023-03-31 (×6): qty 1

## 2023-03-31 MED ORDER — MORPHINE SULFATE (PF) 2 MG/ML IV SOLN: 1.0000 mg | INTRAVENOUS | Status: DC | PRN

## 2023-03-31 MED ORDER — INSULIN PUMP
Freq: Three times a day (TID) | SUBCUTANEOUS | Status: DC
  Filled 2023-03-31: qty 1

## 2023-03-31 NOTE — Progress Notes (Addendum)
PROGRESS NOTE   Andrew Oconnor  YNW:[id number] DOB: 26-Jun-1956 DOA: 03/30/2023 PCP: [facility]   Chief Complaint  Patient presents with   Abdominal Pain   Level of care: Med-Surg  Brief Admission History:  67 y.o. male with medical history significant of CHF, diabetes mellitus type 2, GERD, hyperlipidemia, restless leg syndrome, hypertension, thyroid disorder, and more presents to the ED with a chief complaint of achy right upper quadrant pain.  Patient reports the pain started yesterday.  He has had it before 2-3 weeks ago.  It usually goes away on its own.  Yesterday did not seem like it was going to go away on its own.  Did not occur around mealtime.  Patient reports it was uncomfortable and dull in quality.  It was in the right upper quadrant and radiating to the epigastric region.  He was nauseous but did not have vomiting.  He did have vomiting the last time he had this pain.  There was no hematemesis.  Patient reports his last meal was dinner time and it was a hot dog.  He has pain that started before that meal.  For lunch time he had tomato sandwich.  His last normal bowel movement was 2 days ago.  He did have a bowel movement yesterday but it was less than what is normal for him.  Although his complaint in the ER was at some point documented his difficulty with urination, patient reports he had no difficulty with voiding.  Patient denies any fevers.  He does report that he had some diaphoresis that felt like a clammy sweat a couple of times yesterday.  He denies any chest pain or dyspnea.  Patient has no other complaints at this time.    Assessment and Plan: Acute cholecystitis - CT abdomen pelvis shows cholecystitis with hypoenhancing lesion of left lobe of liver - Jenkins consulted and recommending ultrasound and MRCP - surgery team requested cardiology preoperative evaluation, reached out to cardiology service who plans to see patient today -  discussed with surgery, ok to advance diet today  Hypokalemia - Potassium up to 3.5 after treatment, give additional IV Mg 7/22  - recheck BMP and Mg in AM  Chronic Restless leg syndrome  - Patient reports that he failed many outpatient medications for restless leg and he ended up on levodopa/carbidopa for this concern - Continue levodopa/carbidopa  Hypertension - Continue home benazepril, metoprolol  Hyperlipidemia - Continue pravastatin  Hypothyroidism - Continue Synthroid  Diabetes mellitus type 2, tightly controlled (A1c 5.8%) - He would like to use his insulin pump for now, I explained to him he may need to remove it for surgery - insulin pump order set initiated and consult to DM coordinator to work with him while on pump for safety  DVT prophylaxis: SCDs Code Status: Full  Family Communication:  Disposition: anticipate home    Consultants:  Surgery Cardiology for preop clearance  DM coordinator  Procedures:   Antimicrobials:    Subjective: Pt reports pain much better this morning. Pt says he wants to use his insulin pump from home and feels he can manage it well.  He has had it for about 1 year.   Objective: Vitals:   03/30/23 2300 03/30/23 2315 03/30/23 2338 03/31/23 0330  BP: 137/63 130/69 (!) 157/64 131/65  Pulse: 68 68 62 61  Resp:  18 18 19   Temp:   98.2 F (36.8 C) 97.8 F (36.6 C)  TempSrc:   Oral Oral  SpO2: 97% 97% 100% 98%  Weight:      Height:        Intake/Output Summary (Last 24 hours) at 03/31/2023 1036 Last data filed at 03/31/2023 0900 Gross per 24 hour  Intake 240 ml  Output --  Net 240 ml   Filed Weights   03/30/23 1935  Weight: 67.1 kg   Examination:  General exam: Appears calm and comfortable  Respiratory system: Clear to auscultation. Respiratory effort normal. Cardiovascular system: normal S1 & S2 heard. No JVD, murmurs, rubs, gallops or clicks. No pedal edema. Gastrointestinal system: Abdomen is nondistended, soft and  tender RUQ area no guarding. No organomegaly or masses felt. Normal bowel sounds heard. Central nervous system: Alert and oriented. No focal neurological deficits. Extremities: Symmetric 5 x 5 power. Skin: No rashes, lesions or ulcers. Psychiatry: Judgement and insight appear normal. Mood & affect appropriate.   Data Reviewed: I have personally reviewed following labs and imaging studies  CBC: Recent Labs  Lab 03/30/23 1940 03/31/23 0356  WBC 9.9 10.7*  NEUTROABS  --  7.1  HGB 17.0 14.5  HCT 49.1 41.0  MCV 89.9 88.9  PLT 242 202    Basic Metabolic Panel: Recent Labs  Lab 03/30/23 1940 03/31/23 0356  NA 139 139  K 3.4* 3.5  CL 105 110  CO2 27 22  GLUCOSE 92 77  BUN 11 12  CREATININE 0.88 0.78  CALCIUM 8.6* 8.0*  MG  --  1.8    CBG: Recent Labs  Lab 03/31/23 0716  GLUCAP 78    No results found for this or any previous visit (from the past 240 hour(s)).   Radiology Studies: CT ABDOMEN PELVIS W CONTRAST  Result Date: 03/30/2023 CLINICAL DATA:  Epigastric pain. EXAM: CT ABDOMEN AND PELVIS WITH CONTRAST TECHNIQUE: Multidetector CT imaging of the abdomen and pelvis was performed using the standard protocol following bolus administration of intravenous contrast. RADIATION DOSE REDUCTION: This exam was performed according to the departmental dose-optimization program which includes automated exposure control, adjustment of the mA and/or kV according to patient size and/or use of iterative reconstruction technique. CONTRAST:  OMNIPAQUE IOHEXOL 300 MG/ML  SOLN COMPARISON:  None Available. FINDINGS: Lower chest: The visualized lung bases are clear. No intra-abdominal free air or free fluid. Hepatobiliary: There is a 1.3 cm hypoenhancing lesion in the left lobe of the liver which is not characterized on this CT but likely represents a flash filling hemangioma or a portal venous shunting. This likely corresponds to the hypoechoic lesion seen on the ultrasound of 04/21/2013.  Further characterization with MRI without and with contrast on a nonemergent/outpatient basis recommended. No biliary dilatation. There multiple stones within the gallbladder. There is mild haziness of the gallbladder wall with trace pericholecystic fluid concerning for acute cholecystitis. Further evaluation with ultrasound is recommended. Pancreas: Unremarkable. No pancreatic ductal dilatation or surrounding inflammatory changes. Spleen: Normal in size without focal abnormality. Adrenals/Urinary Tract: The adrenal glands are unremarkable. The kidneys, visualized ureters, and urinary bladder appear unremarkable. Stomach/Bowel: There is scattered colonic diverticula without active inflammatory changes. There is enhancement of the gastric mucosa, likely physiologic. Gastritis is less likely. Clinical correlation is recommended. There is no bowel obstruction. The appendix is normal. Vascular/Lymphatic: Mild aortoiliac atherosclerotic disease. The IVC is unremarkable. No portal venous gas. There is no adenopathy. Reproductive: The prostate and seminal vesicles are grossly unremarkable. No pelvic mass. Other: Small fat containing bilateral inguinal hernias. Musculoskeletal: Degenerative changes of the spine. No acute osseous pathology. IMPRESSION: 1.  Cholelithiasis with findings concerning for acute cholecystitis. Further evaluation with ultrasound is recommended. 2. Colonic diverticulosis. No bowel obstruction. Normal appendix. 3. A 1.3 cm hypoenhancing lesion in the left lobe of the liver, likely a flash filling hemangioma or a portal venous shunting. Further characterization with MRI without and with contrast on a nonemergent/outpatient basis recommended. 4.  Aortic Atherosclerosis (ICD10-I70.0). Electronically Signed   By: Elgie Collard M.D.   On: 03/30/2023 21:47    Scheduled Meds:  benazepril  5 mg Oral Daily   Carbidopa-Levodopa ER  1 tablet Oral BID   insulin pump   Subcutaneous TID WC, HS, 0200    levothyroxine  125 mcg Oral Q0600   melatonin  6 mg Oral QHS   metoprolol succinate  25 mg Oral Daily   pantoprazole  40 mg Oral Daily   pravastatin  40 mg Oral Daily   spironolactone  25 mg Oral Daily   Continuous Infusions:  sodium chloride 75 mL/hr at 03/31/23 0006     LOS: 0 days   Time spent: 40 mins  Pollie Poma Laural Benes, MD How to contact the [facility] Attending or Consulting provider 7A - 7P or covering provider during after hours 7P -7A, for this patient?  Check the care team in [facility] and look for a) attending/consulting [facility] provider listed and b) the [facility] team listed Log into [email or web address] and use [place]'s universal password to access. If you do not have the password, please contact the hospital operator. Locate the [facility] provider you are looking for under [facility] and page to a number that you can be directly reached. If you still have difficulty reaching the provider, please page the [facility] (Director on Call) for the Hospitalists listed on amion for assistance.  03/31/2023, 10:36 AM

## 2023-03-31 NOTE — Care Management Obs Status (Signed)
MEDICARE OBSERVATION STATUS NOTIFICATION   Patient Details  Name: KIREN MCISAAC MRN: [id number] Date of Birth: Aug 19, 1956   Medicare Observation Status Notification Given:  Yes    Karn Cassis, LCSW 03/31/2023, 2:22 PM

## 2023-03-31 NOTE — Progress Notes (Signed)
Pt admitted due to acute cholecystitis. He reports he lives with his wife and is independent with ADLs. No needs reported at this time. TOC will follow.     03/31/23 1417  TOC Brief Assessment  Insurance and Status Reviewed  Patient has primary care physician Yes  Home environment has been reviewed Lives with wife.  Prior level of function: Independent.  Prior/Current Home Services No current home services  Social Determinants of Health Reivew SDOH reviewed no interventions necessary  Readmission risk has been reviewed Yes  Transition of care needs no transition of care needs at this time

## 2023-03-31 NOTE — Progress Notes (Addendum)
Patient wife has not arrived with insulin. Pt CBG shows 203.   Consulted MD, pharmacy [facility], and Diabetes RN Sinda Du and was informed that we should provide patient with insulin to refill reservoir. Provided 3mL to patient, had second RN to verify insulin amount and patient refilled insulin pump.

## 2023-03-31 NOTE — Progress Notes (Signed)
New orders placed by provider to monitor patient CBG and provide insulin via patients insulin pump. Pt currently out of insulin. Reached out to pharmacy [facility] and was told that we no longer fill insulin pumps.   Spoke with patient. He is going to contact wife to supply insulin from home.

## 2023-03-31 NOTE — Assessment & Plan Note (Signed)
Continue Synthroid °

## 2023-03-31 NOTE — Assessment & Plan Note (Signed)
-   Patient reports that he failed many outpatient medications for restless leg and he ended up on levodopa/carbidopa for this concern - Continue levodopa/carbidopa

## 2023-03-31 NOTE — Assessment & Plan Note (Signed)
-   Continue Lotensin, metoprolol

## 2023-03-31 NOTE — H&P (Signed)
History and Physical    Patient: Andrew Oconnor ZOX:[id number] DOB: 10-01-1955 DOA: 03/30/2023 DOS: the patient was seen and examined on 03/31/2023 PCP: [facility]  Patient coming from: Home  Chief Complaint:  Chief Complaint  Patient presents with   Abdominal Pain   HPI: Andrew Oconnor is a 67 y.o. male with medical history significant of CHF, diabetes mellitus type 2, GERD, hyperlipidemia, restless leg syndrome, hypertension, thyroid disorder, and more presents to the ED with a chief complaint of achy right upper quadrant pain.  Patient reports the pain started yesterday.  He has had it before 2-3 weeks ago.  It usually goes away on its own.  Yesterday did not seem like it was going to go away on its own.  Did not occur around mealtime.  Patient reports it was uncomfortable and dull in quality.  It was in the right upper quadrant and radiating to the epigastric region.  He was nauseous but did not have vomiting.  He did have vomiting the last time he had this pain.  There was no hematemesis.  Patient reports his last meal was dinner time and it was a hot dog.  He has pain that started before that meal.  For lunch time he had tomato sandwich.  His last normal bowel movement was 2 days ago.  He did have a bowel movement yesterday but it was less than what is normal for him.  Although his complaint in the ER was at some point documented his difficulty with urination, patient reports he had no difficulty with voiding.  Patient denies any fevers.  He does report that he had some diaphoresis that felt like a clammy sweat a couple of times yesterday.  He denies any chest pain or dyspnea.  Patient has no other complaints at this time.  Patient does not smoke, does not drink.  He is full code. Review of Systems: As mentioned in the history of present illness. All other systems reviewed and are negative. Past Medical History:  Diagnosis Date   Allergy    Anxiety     Carpal tunnel syndrome    Cataract    CHF (congestive heart failure) (HCC)    Colon polyp    Depression    Diabetes mellitus without complication (HCC)    Erectile dysfunction    GERD (gastroesophageal reflux disease)    Hiatal hernia    History of diplopia     third nerve palsy 2013    Hyperlipidemia    Hypertension    Obesity    Sleep apnea    Sleep apnea with use of continuous positive airway pressure (CPAP)    diagnsed AHi of 20 in 2010 , titrated to 13 cm H20.   Thyroid disorder    Past Surgical History:  Procedure Laterality Date   CATARACT EXTRACTION W/PHACO Right 10/09/2015   Procedure: CATARACT EXTRACTION PHACO AND INTRAOCULAR LENS PLACEMENT (IOC);  Surgeon: Susa Simmonds, MD;  Location: [facility];  Service: Ophthalmology;  Laterality: Right;  CDE:2.17   HAND SURGERY Bilateral    secondary to nerve damage   hemorrectomy     LEFT HEART CATHETERIZATION WITH CORONARY ANGIOGRAM N/A 11/24/2014   Procedure: LEFT HEART CATHETERIZATION WITH CORONARY ANGIOGRAM;  Surgeon: Peter M Swaziland, MD;  Location: [facility];  Service: Cardiovascular;  Laterality: N/A;   LESION REMOVAL N/A 05/04/2021   Procedure: EXCISION OF CHRONIC SUPRAPUBIC ABSCESS;  Surgeon: Gaynelle Adu, MD;  Location: [facility];  Service: General;  Laterality: N/A;   Social History:  reports that he quit smoking about 20 years ago. His smoking use included cigarettes. He has never used smokeless tobacco. He reports current alcohol use. He reports that he does not use drugs.  Allergies  Allergen Reactions   Clonazepam Other (See Comments)    "It makes him go crazy"   Donepezil Other (See Comments)   Gabapentin     Other Reaction(s): Other (See Comments)  Mood swing   Ropinirole    Ropinirole Hcl Other (See Comments)    Family History  Problem Relation Age of Onset   Heart disease Mother    Heart disease Father    Diabetes Father    Stroke Father    Seizures Father    Stroke Other    Cancer  Sister    Heart disease Sister    Heart disease Brother    Diabetes Brother    Heart disease Maternal Grandmother    Heart disease Maternal Grandfather    Heart disease Paternal Grandmother    Heart disease Paternal Grandfather    Colon cancer Neg Hx    Esophageal cancer Neg Hx    Rectal cancer Neg Hx    Stomach cancer Neg Hx     Prior to Admission medications   Medication Sig Start Date End Date Taking? Authorizing Provider  aspirin EC 81 MG tablet Take 1 tablet (81 mg total) by mouth daily. Swallow whole. 10/30/21   Swaziland, Peter M, MD  benazepril (LOTENSIN) 10 MG tablet Take 0.5 tablets (5 mg total) by mouth daily. Take 1/2 tablet by mouth daily. 02/24/23   [place], Peter M, MD  Blood Glucose Calibration (OT ULTRA/FASTTK CNTRL SOLN) SOLN  03/16/14   [provider]  clonazePAM (KLONOPIN) 0.5 MG tablet Take 0.5 mg by mouth daily as needed for anxiety. 09/18/22   [provider]  insulin lispro (HUMALOG) 100 UNIT/ML cartridge Inject 80 Units into the skin daily.    [provider]  levothyroxine (SYNTHROID, LEVOTHROID) 150 MCG tablet Take 150 mcg by mouth daily before breakfast.  08/02/15   [provider]  metoprolol succinate (TOPROL-XL) 25 MG 24 hr tablet Take 1 tablet (25 mg total) by mouth daily. 11/05/22   [place]il[place], Peter M, MD  omeprazole (PRILOSEC) 40 MG capsule Take 40 mg by mouth daily. 07/26/16   [provider]  pravastatin (PRAVACHOL) 40 MG tablet Take 40 mg by mouth daily.  01/16/13   [provider]  spironolactone (ALDACTONE) 25 MG tablet Take 1 tablet (25 mg total) by mouth daily. 02/04/23 06/04/23  [place], Peter M, MD    Physical Exam: Vitals:   03/30/23 2300 03/30/23 2315 03/30/23 2338 03/31/23 0330  BP: 137/63 130/69 (!) 157/64 131/65  Pulse: 68 68 62 61  Resp:  18 18 19   Temp:   98.2 F (36.8 C) 97.8 F (36.6 C)  TempSrc:   Oral Oral  SpO2: 97% 97% 100% 98%  Weight:      Height:       1.  General: Patient lying  supine in bed,  no acute distress   2. Psychiatric: Alert and oriented x 3, mood and behavior normal for situation, pleasant and cooperative with exam   3. Neurologic: Speech and language are normal, face is symmetric, moves all 4 extremities voluntarily, at baseline without acute deficits on limited exam   4. HEENMT:  Head is atraumatic, normocephalic, pupils reactive to light, neck is supple, trachea is midline,  mucous membranes are moist   5. Respiratory : Lungs are clear to auscultation bilaterally without wheezing, rhonchi, rales, no cyanosis, no increase in work of breathing or accessory muscle use   6. Cardiovascular : Heart rate normal, rhythm is regular, no murmurs, rubs or gallops, no peripheral edema, peripheral pulses palpated   7. Gastrointestinal:  Abdomen is soft, nondistended, nontender to palpation bowel sounds active, no masses or organomegaly palpated   8. Skin:  Skin is warm, dry and intact without rashes, acute lesions, or ulcers on limited exam   9.Musculoskeletal:  No acute deformities or trauma, no asymmetry in tone, no peripheral edema, peripheral pulses palpated, no tenderness to palpation in the extremities  Data Reviewed: In the ED Temp 98.4, heart rate 52-65, respiratory rate 13-18, blood pressure 138/60-192/76 No leukocytosis, hemoglobin 17.0 Slight hypokalemia at 3.4 UA is not indicative of UTI CT abdomen pelvis shows acute cholecystitis General surgery consulted and plans to see patient in the a.m. but recommends right upper quadrant ultrasound for the a.m. EKG shows a heart rate of 50, sinus rhythm, QTc 403 Patient's T. bili is up to 2.5 which is the reason for the ultrasound to assess for choledocholithiasis Admission requested for acute cholecystitis Assessment and Plan: * Acute cholecystitis - CT abdomen pelvis shows cholecystitis with hypoenhancing lesion of left lobe of liver - Jenkins consulted and recommends ultrasound in the a.m. no  plans for OR tonight - N.p.o. - Continue to monitor  Hypokalemia - Potassium 3.4 - 20 mEq IV potassium ordered - Continue to monitor  Restless leg - Patient reports that he failed many outpatient medications for restless leg and he ended up on levodopa/carbidopa for this concern - Continue levodopa/carbidopa  Hypertension - Continue Lotensin, metoprolol  Hyperlipidemia - Continue statin  Hypothyroidism - Continue Synthroid  Diabetes mellitus type 2, controlled (HCC) - Patient is declining sliding scale at this time and using his insulin pump - He would like insulin to refill his pump, I am unsure sure about the policies regarding this - Please follow-up on day team when there is a pharmacist available - Minimal glucoses controlled at 92 - Continue to monitor      Advance Care Planning:   Code Status: Full Code  Consults: General surgery  Family Communication: No family at bedside  Severity of Illness: The appropriate patient status for this patient is OBSERVATION. Observation status is judged to be reasonable and necessary in order to provide the required intensity of service to ensure the patient's safety. The patient's presenting symptoms, physical exam findings, and initial radiographic and laboratory data in the context of their medical condition is felt to place them at decreased risk for further clinical deterioration. Furthermore, it is anticipated that the patient will be medically stable for discharge from the hospital within 2 midnights of admission.   Author: Lilyan Gilford, DO 03/31/2023 5:51 AM  For on call review [email or web address].

## 2023-03-31 NOTE — Assessment & Plan Note (Signed)
-   Patient is declining sliding scale at this time and using his insulin pump - He would like insulin to refill his pump, I am unsure sure about the policies regarding this - Please follow-up on day team when there is a pharmacist available - Minimal glucoses controlled at 92 - Continue to monitor

## 2023-03-31 NOTE — Inpatient Diabetes Management (Addendum)
Inpatient Diabetes Program Recommendations  AACE/ADA: New Consensus Statement on Inpatient Glycemic Control   Target Ranges:  Prepandial:   less than 140 mg/dL      Peak postprandial:   less than 180 mg/dL (1-2 hours)      Critically ill patients:  140 - 180 mg/dL    Latest Reference Range & Units 03/31/23 07:16 03/31/23 11:11  Glucose-Capillary 70 - 99 mg/dL 78 191 (H)    Review of Glycemic Control  Diabetes history: DM1 Outpatient Diabetes medications: T-Slim insulin pump with Novolog with Dexcom G7 CGM Current orders for Inpatient glycemic control: Insulin Pump AC&HS and 2am   NOTE: Noted consult for patient with insulin pump. In reviewing chart, noted patient sees [facility] for DM management and was last seen on 03/05/23 by B. Mayford Knife, PA-C. Per office note on 03/05/23, the following should be insulin pump settings:  Basal 12A  1.8 units/hr 7A    2.2 units/hr 10A  2.4 units/hr 6P    2.85 units/hr Insulin to Carb Ratio 1:17 grams Insulin Sensitivity 1:5 mg/dl Target Glucose 478 mg/dl  Spoke with patient over the phone and he reports that he has his insulin pump but he does not have it on at this time because he was told he had to remove it when he went for a CT about 3 hours ago. Patient states that he has everything to restart his pump except the insulin and he is wanting to get his pump back on. Informed patient that I would reach out to pharmacy and RN to see if pharmacy can send a vial of Novolog for him to use to fill up his insulin reservoir. Patient confirms he goes to [facility] and that his insulin pump settings should be exactly as noted in the office note on 03/05/23 because he does not make any adjustments to the settings himself. Patient confirms that he uses the Control IQ (auto mode) on his insulin pump and glucose is usually very well controlled. Patient states that his glucose is in the mid 200's currently and rising.  Asked patient to get his insulin pump restarted as soon as he is given the vial of Novolog insulin. Patient appreciative of information and has no questions at this time. Sent chat message to Christell Faith and Merry Proud, RN to request pharmacy provide vial of Novolog for patient to use to fill reservoir.   Addendum 15:27-Called patient over the phone to inquire about restarting his insulin pump. Patient states that he had to go for 2 tests after I spoke with him earlier. He has received the Novolog insulin and got his pump restarted. He states that by the time he got his insulin pump back on his glucose was 348 mg/dl on hospital glucometer. Patient states he is working to try to get his glucose down and he has done a correction and currently has 3 units of insulin on board. Patient states that his RN has reached out to the attending provider to ask about possibly giving him a SQ insulin injection to help get this glucose down quicker. Patient appreciative of return call and help with getting the insulin he needed to restart his insulin pump.  Thanks, Orlando Penner, RN, MSN, CDCES Diabetes Coordinator Inpatient Diabetes Program [phone] (Team Pager from 8am to 5pm)

## 2023-03-31 NOTE — Assessment & Plan Note (Signed)
-   Potassium 3.4 - 20 mEq IV potassium ordered - Continue to monitor

## 2023-03-31 NOTE — Progress Notes (Signed)
  Echocardiogram 2D Echocardiogram has been performed.  Janalyn Harder 03/31/2023, 2:06 PM

## 2023-03-31 NOTE — Hospital Course (Signed)
67 y.o. male with medical history significant of CHF, diabetes mellitus type 2, GERD, hyperlipidemia, restless leg syndrome, hypertension, thyroid disorder, and more presents to the ED with a chief complaint of achy right upper quadrant pain.  Patient reports the pain started yesterday.  He has had it before 2-3 weeks ago.  It usually goes away on its own.  Yesterday did not seem like it was going to go away on its own.  Did not occur around mealtime.  Patient reports it was uncomfortable and dull in quality.  It was in the right upper quadrant and radiating to the epigastric region.  He was nauseous but did not have vomiting.  He did have vomiting the last time he had this pain.  There was no hematemesis.  Patient reports his last meal was dinner time and it was a hot dog.  He has pain that started before that meal.  For lunch time he had tomato sandwich.  His last normal bowel movement was 2 days ago.  He did have a bowel movement yesterday but it was less than what is normal for him.  Although his complaint in the ER was at some point documented his difficulty with urination, patient reports he had no difficulty with voiding.  Patient denies any fevers.  He does report that he had some diaphoresis that felt like a clammy sweat a couple of times yesterday.  He denies any chest pain or dyspnea.  Patient has no other complaints at this time.

## 2023-03-31 NOTE — Consult Note (Addendum)
Cardiology Consult Note:  .   Date:  03/31/2023  ID:  Lethea Killings, DOB 07-13-1956, MRN [id number] PCP: [facility]  [facility] Providers Cardiologist:  Peter Swaziland, MD    History of Present Illness: .   AUM CAGGIANO is a 67 y.o. male with history  of IDDM on insulin, HTN, HLD, OSA, hypothyroidism and chronic diastolic HF.  He had a cardiac catheterization on 11/24/2014 which demonstrated normal anatomy, normal LV function, normal EDP. Echocardiogram showed EF 55 to 60%, normal diastolic function.  Heart monitor obtained in May 2016 showed 7 beats run of NSVT.  This is controlled on beta-blocker.   He was seen in 2019 with chest pain. ETT was done and was normal. Patient last saw Dr. Swaziland 10/2022 and was stable.   We are asked to see for preop clearance for cholecystomy by Dr. Lovell Sheehan. Patient denies chest pain, dyspnea, palpitations, edema, dizziness or presyncope. He walks 10 min several times a week to feed deer but does house work/yard work without difficulty. BP has been controlled at home.  ROS: Review of Systems  Constitutional: Negative.  HENT: Negative.    Cardiovascular: Negative.   Respiratory: Negative.    Endocrine: Negative.   Hematologic/Lymphatic: Negative.   Musculoskeletal: Negative.   Gastrointestinal:  Positive for abdominal pain, nausea and vomiting.  Genitourinary: Negative.   Neurological: Negative.      Studies Reviewed: Marland Kitchen    EKG Interpretation Date/Time:  Sunday March 30 2023 20:49:08 EDT Ventricular Rate:  50 PR Interval:  131 QRS Duration:  87 QT Interval:  441 QTC Calculation: 403 R Axis:   72  Text Interpretation: Sinus rhythm Borderline T abnormalities, inferior leads When compared with ECG of 09/25/2022, HEART RATE has decreased Confirmed by Dione Booze ([id number]) on 03/30/2023 11:29:51 PM    Prior CV Studies:   Cath 11/23/2016 Procedural Findings: Hemodynamics: AO 156/78 mean 114 mm Hg LV 166/32  mm Hg   Coronary angiography: Coronary dominance: left   Left mainstem:    Left anterior descending (LAD): Normal    Left circumflex (LCx): Normal   Right coronary artery (RCA): Normal   Left ventriculography: Left ventricular systolic function is normal, LVEF is estimated at 55-65%, there is no significant mitral regurgitation. The proximal aorta is mildly dilated.    Final Conclusions:   1. Normal coronary anatomy. 2. Normal LV function 3. Elevated LV EDP     Echo 11/25/2014 LV EF: 55% -   60% Study Conclusions  - Left ventricle: The cavity size was normal. Systolic function was   normal. The estimated ejection fraction was in the range of 55%   to 60%. Wall motion was normal; there were no regional wall   motion abnormalities. Left ventricular diastolic function   parameters were normal. - Left atrium: The atrium was mildly dilated. - Atrial septum: No defect or patent foramen ovale was identified. - Pericardium, extracardiac: A trivial pericardial effusion was   identified posterior to the heart.     Event monitor 01/18/2015 Study Highlights    NSR and sinus tachycardia 7 beat run of NSVT Symptoms of skipped beats and tachycardia do not correlate with arrythmia.   1. Normal sinus rhythm 2. 7 beat run NSVT 3. Symptoms do not correlate with arrhythmia.    ETT 07/14/18: Stress Findings   Stress Findings The patient exercised following the Bruce protocol.   The patient reported shortness of breath during the stress test. The patient experienced no  angina during the stress test.   The test was stopped because  the patient complained of fatigue and shortness of breath.   Heart rate demonstrated a normal response to exercise. Blood pressure demonstrated a hypertensive response to exercise. Overall, the patient's exercise capacity was normal.   85% of maximum heart rate was achieved after 6.5 minutes.  Recovery time:  5 minutes.   Duke Treadmill Score: low risk The  patient's response to exercise was adequate for diagnosis.    Risk Assessment/Calculations:             Physical Exam:   VS:  BP 131/65 (BP Location: Left Arm)   Pulse 61   Temp 97.8 F (36.6 C) (Oral)   Resp 19   Ht 4\' 7"  (1.397 m)   Wt 67.1 kg   SpO2 98%   BMI 34.40 kg/m    Wt Readings from Last 3 Encounters:  03/30/23 67.1 kg  11/05/22 67.9 kg  09/25/22 66.7 kg    GEN: Well nourished, well developed in no acute distress NECK: right carotid bruit, No JVD;   CARDIAC:  RRR, no murmurs, rubs, gallops RESPIRATORY:  Clear to auscultation without rales, wheezing or rhonchi  ABDOMEN: Soft, non-tender, non-distended EXTREMITIES:  No edema; No deformity   ASSESSMENT AND PLAN: .    Preop clearance for cholecystectomy by Dr. Lovell Sheehan. History of normal cath in 2018,normal GXT 2019 and no cardiac complaints. Can proceed with surgery without further cardiac w/u. Can put on telemetry post op(not currently on). Echo ordered by primary team. According to the Revised Cardiac Risk Index (RCRI), 6.6 being diabetic and diastolic CHF.  His METS according to the Duke Activity Status Index (DASI) 6.27   HTN-controlled lotensin 5 mg daily, toprol xl 25 mg daily, spiro 25 mg daily  HLD on pravachol  NSVT on monitor treated with BB  Chronic diastolic CHF-echo ordered  Right carotid bruit-check carotid dopplers          Signed, Jacolyn Reedy, PA-C   Attending note   Patient seen and discussed with PA Geni Bers, I agree with her documentation. 67 yo male history of DM2, HTN, HLD, OSA, chronic HFpEF presented with abdominal pain. Concern for acute cholecystitis with plans for cholecystectomy, cardiology consulted for preoperative evaluation     He denies any recnet SOB, chest pain. Remains physically active, doing moderate to heavy housework and yardwork   K 3.4 Cr 0.88 BUN 11 WBC 9.9 Hgb 17 Plt 242 Trop 6 EKG CT A/P: gallstones, possibly acute cholecystitis   07/2018 GXT: normal  exercise capacity, 6.5 min, low risk duke treadmill score Cath 2018: normal coronaries 2016 echo: LVEF 55-60%, normal diastolic fxn,    1.Preoperative evaluation - no active acute cardiac conditions - cardiac testing over the years has been benign including echo, cath, GXT - tolerates greater than 4 METs on a regular basis - can f/u echo ordered by primary team, unless significant findings would anticipate clearing from cardiac standpoint       Dina Rich MD

## 2023-03-31 NOTE — Consult Note (Signed)
Reason for Consult: Right upper quadrant pain Referring Physician: Greenland Zierle-Ghosh  Andrew Oconnor is an 67 y.o. male.  HPI: Patient reports intermittent RUQ pain that started yesterday. He has had this pain for some time and notes that the last time it occurred prior to yesterday was two weeks ago. Yesterday's episode was the worse it has ever been. He notes the pain is dull in quality and radiates mainly across his abdomen and slightly to his shoulder. He also reports nausea. Patient denies fevers.  Relevant history includes left heart catheterization in 2016, CHF, DMII (on insulin), GERD, HTN, and HLD.  Past Medical History:  Diagnosis Date   Allergy    Anxiety    Carpal tunnel syndrome    Cataract    CHF (congestive heart failure) (HCC)    Colon polyp    Depression    Diabetes mellitus without complication (HCC)    Erectile dysfunction    GERD (gastroesophageal reflux disease)    Hiatal hernia    History of diplopia     third nerve palsy 2013    Hyperlipidemia    Hypertension    Obesity    Sleep apnea    Sleep apnea with use of continuous positive airway pressure (CPAP)    diagnsed AHi of 20 in 2010 , titrated to 13 cm H20.   Thyroid disorder     Past Surgical History:  Procedure Laterality Date   CATARACT EXTRACTION W/PHACO Right 10/09/2015   Procedure: CATARACT EXTRACTION PHACO AND INTRAOCULAR LENS PLACEMENT (IOC);  Surgeon: Susa Simmonds, MD;  Location: [facility];  Service: Ophthalmology;  Laterality: Right;  CDE:2.17   HAND SURGERY Bilateral    secondary to nerve damage   hemorrectomy     LEFT HEART CATHETERIZATION WITH CORONARY ANGIOGRAM N/A 11/24/2014   Procedure: LEFT HEART CATHETERIZATION WITH CORONARY ANGIOGRAM;  Surgeon: Peter M Swaziland, MD;  Location: [facility] CATH LAB;  Service: Cardiovascular;  Laterality: N/A;   LESION REMOVAL N/A 05/04/2021   Procedure: EXCISION OF CHRONIC SUPRAPUBIC ABSCESS;  Surgeon: Gaynelle Adu, MD;  Location: [facility];   Service: General;  Laterality: N/A;    Family History  Problem Relation Age of Onset   Heart disease Mother    Heart disease Father    Diabetes Father    Stroke Father    Seizures Father    Stroke Other    Cancer Sister    Heart disease Sister    Heart disease Brother    Diabetes Brother    Heart disease Maternal Grandmother    Heart disease Maternal Grandfather    Heart disease Paternal Grandmother    Heart disease Paternal Grandfather    Colon cancer Neg Hx    Esophageal cancer Neg Hx    Rectal cancer Neg Hx    Stomach cancer Neg Hx     Social History:  reports that he quit smoking about 20 years ago. His smoking use included cigarettes. He has never used smokeless tobacco. He reports current alcohol use. He reports that he does not use drugs.  Allergies:  Allergies  Allergen Reactions   Clonazepam Other (See Comments)    "It makes him go crazy"   Donepezil Other (See Comments)   Gabapentin     Other Reaction(s): Other (See Comments)  Mood swing   Ropinirole    Ropinirole Hcl Other (See Comments)    Medications: I have reviewed the patient's current medications.  Results for orders placed or performed during the hospital  encounter of 03/30/23 (from the past 48 hour(s))  Lipase, blood     Status: None   Collection Time: 03/30/23  7:40 PM  Result Value Ref Range   Lipase 23 11 - 51 U/L    Comment: Performed at [facility], [address], [place], [place] [address]  Comprehensive metabolic panel     Status: Abnormal   Collection Time: 03/30/23  7:40 PM  Result Value Ref Range   Sodium 139 135 - 145 mmol/L   Potassium 3.4 (L) 3.5 - 5.1 mmol/L   Chloride 105 98 - 111 mmol/L   CO2 27 22 - 32 mmol/L   Glucose, Bld 92 70 - 99 mg/dL    Comment: Glucose reference range applies only to samples taken after fasting for at least 8 hours.   BUN 11 8 - 23 mg/dL   Creatinine, Ser 8.46 0.61 - 1.24 mg/dL   Calcium 8.6 (L) 8.9 - 10.3 mg/dL   Total Protein 7.5 6.5 - 8.1  g/dL   Albumin 4.0 3.5 - 5.0 g/dL   AST 52 (H) 15 - 41 U/L   ALT 36 0 - 44 U/L   Alkaline Phosphatase 118 38 - 126 U/L   Total Bilirubin 2.5 (H) 0.3 - 1.2 mg/dL   GFR, Estimated >96 >29 mL/min    Comment: (NOTE) Calculated using the CKD-EPI Creatinine Equation (2021)    Anion gap 7 5 - 15    Comment: Performed at [facility], [address] [place], [place] [address]  CBC     Status: None   Collection Time: 03/30/23  7:40 PM  Result Value Ref Range   WBC 9.9 4.0 - 10.5 K/uL   RBC 5.46 4.22 - 5.81 MIL/uL   Hemoglobin 17.0 13.0 - 17.0 g/dL   HCT 32.4 40.1 - 02.7 %   MCV 89.9 80.0 - 100.0 fL   MCH 31.1 26.0 - 34.0 pg   MCHC 34.6 30.0 - 36.0 g/dL   RDW 25.3 66.4 - 40.3 %   Platelets 242 150 - 400 K/uL   nRBC 0.0 0.0 - 0.2 %    Comment: Performed at [facility], [address], [place], [place] [address]  Urinalysis, Routine w reflex microscopic -Urine, Clean Catch     Status: None   Collection Time: 03/30/23  7:51 PM  Result Value Ref Range   Color, Urine YELLOW YELLOW   APPearance CLEAR CLEAR   Specific Gravity, Urine 1.017 1.005 - 1.030   pH 6.0 5.0 - 8.0   Glucose, UA NEGATIVE NEGATIVE mg/dL   Hgb urine dipstick NEGATIVE NEGATIVE   Bilirubin Urine NEGATIVE NEGATIVE   Ketones, ur NEGATIVE NEGATIVE mg/dL   Protein, ur NEGATIVE NEGATIVE mg/dL   Nitrite NEGATIVE NEGATIVE   Leukocytes,Ua NEGATIVE NEGATIVE    Comment: Performed at [facility], [address], [place], [place] [address]  Urinalysis, Complete w Microscopic -Urine, Clean Catch     Status: Abnormal   Collection Time: 03/31/23  1:50 AM  Result Value Ref Range   Color, Urine YELLOW YELLOW   APPearance CLEAR CLEAR   Specific Gravity, Urine 1.043 (H) 1.005 - 1.030   pH 7.0 5.0 - 8.0   Glucose, UA NEGATIVE NEGATIVE mg/dL   Hgb urine dipstick NEGATIVE NEGATIVE   Bilirubin Urine NEGATIVE NEGATIVE   Ketones, ur NEGATIVE NEGATIVE mg/dL   Protein, ur NEGATIVE NEGATIVE mg/dL   Nitrite NEGATIVE NEGATIVE    Leukocytes,Ua NEGATIVE NEGATIVE   RBC / HPF 0-5 0 - 5 RBC/hpf  WBC, UA 0-5 0 - 5 WBC/hpf   Bacteria, UA NONE SEEN NONE SEEN   Squamous Epithelial / HPF 0-5 0 - 5 /HPF    Comment: Performed at [facility], [address] [place], [place] [address]  Comprehensive metabolic panel     Status: Abnormal   Collection Time: 03/31/23  3:56 AM  Result Value Ref Range   Sodium 139 135 - 145 mmol/L   Potassium 3.5 3.5 - 5.1 mmol/L   Chloride 110 98 - 111 mmol/L   CO2 22 22 - 32 mmol/L   Glucose, Bld 77 70 - 99 mg/dL    Comment: Glucose reference range applies only to samples taken after fasting for at least 8 hours.   BUN 12 8 - 23 mg/dL   Creatinine, Ser 6.04 0.61 - 1.24 mg/dL   Calcium 8.0 (L) 8.9 - 10.3 mg/dL   Total Protein 6.1 (L) 6.5 - 8.1 g/dL   Albumin 3.2 (L) 3.5 - 5.0 g/dL   AST 33 15 - 41 U/L   ALT 37 0 - 44 U/L   Alkaline Phosphatase 101 38 - 126 U/L   Total Bilirubin 2.0 (H) 0.3 - 1.2 mg/dL   GFR, Estimated >54 >09 mL/min    Comment: (NOTE) Calculated using the CKD-EPI Creatinine Equation (2021)    Anion gap 7 5 - 15    Comment: Performed at [facility], [address] [place], [place] [address]  Magnesium     Status: None   Collection Time: 03/31/23  3:56 AM  Result Value Ref Range   Magnesium 1.8 1.7 - 2.4 mg/dL    Comment: Performed at [facility], [address] [place], [place] [address]  CBC with Differential/Platelet     Status: Abnormal   Collection Time: 03/31/23  3:56 AM  Result Value Ref Range   WBC 10.7 (H) 4.0 - 10.5 K/uL   RBC 4.61 4.22 - 5.81 MIL/uL   Hemoglobin 14.5 13.0 - 17.0 g/dL   HCT 56.2 13.0 - 86.5 %   MCV 88.9 80.0 - 100.0 fL   MCH 31.5 26.0 - 34.0 pg   MCHC 35.4 30.0 - 36.0 g/dL   RDW 78.4 69.6 - 29.5 %   Platelets 202 150 - 400 K/uL   nRBC 0.0 0.0 - 0.2 %   Neutrophils Relative % 66 %   Neutro Abs 7.1 1.7 - 7.7 K/uL   Lymphocytes Relative 25 %   Lymphs Abs 2.6 0.7 - 4.0 K/uL   Monocytes Relative 7 %   Monocytes Absolute 0.7 0.1 - 1.0  K/uL   Eosinophils Relative 2 %   Eosinophils Absolute 0.2 0.0 - 0.5 K/uL   Basophils Relative 0 %   Basophils Absolute 0.0 0.0 - 0.1 K/uL   Immature Granulocytes 0 %   Abs Immature Granulocytes 0.03 0.00 - 0.07 K/uL    Comment: Performed at [facility], [address], [place], [place] [address]  Hemoglobin A1c     Status: None   Collection Time: 03/31/23  3:56 AM  Result Value Ref Range   Hgb A1c MFr Bld 5.1 4.8 - 5.6 %    Comment: (NOTE) Pre diabetes:          5.7%-6.4%  Diabetes:              >6.4%  Glycemic control for   <7.0% adults with diabetes    Mean Plasma Glucose 99.67 mg/dL    Comment: Performed at [facility], [address] [place], [place] [address]  Troponin I (High Sensitivity)     Status: None   Collection Time: 03/31/23  3:56 AM  Result Value Ref Range   Troponin I (High Sensitivity) 6 <18 ng/L    Comment: (NOTE) Elevated high sensitivity troponin I (hsTnI) values and significant  changes across serial measurements may suggest ACS but many other  chronic and acute conditions are known to elevate hsTnI results.  Refer to the "Links" section for chest pain algorithms and additional  guidance. Performed at [facility], [address], [place], [place] [address]   Glucose, capillary     Status: None   Collection Time: 03/31/23  7:16 AM  Result Value Ref Range   Glucose-Capillary 78 70 - 99 mg/dL    Comment: Glucose reference range applies only to samples taken after fasting for at least 8 hours.    CT ABDOMEN PELVIS W CONTRAST  Result Date: 03/30/2023 CLINICAL DATA:  Epigastric pain. EXAM: CT ABDOMEN AND PELVIS WITH CONTRAST TECHNIQUE: Multidetector CT imaging of the abdomen and pelvis was performed using the standard protocol following bolus administration of intravenous contrast. RADIATION DOSE REDUCTION: This exam was performed according to the departmental dose-optimization program which includes automated exposure control, adjustment of the mA  and/or kV according to patient size and/or use of iterative reconstruction technique. CONTRAST:  OMNIPAQUE IOHEXOL 300 MG/ML  SOLN COMPARISON:  None Available. FINDINGS: Lower chest: The visualized lung bases are clear. No intra-abdominal free air or free fluid. Hepatobiliary: There is a 1.3 cm hypoenhancing lesion in the left lobe of the liver which is not characterized on this CT but likely represents a flash filling hemangioma or a portal venous shunting. This likely corresponds to the hypoechoic lesion seen on the ultrasound of 04/21/2013. Further characterization with MRI without and with contrast on a nonemergent/outpatient basis recommended. No biliary dilatation. There multiple stones within the gallbladder. There is mild haziness of the gallbladder wall with trace pericholecystic fluid concerning for acute cholecystitis. Further evaluation with ultrasound is recommended. Pancreas: Unremarkable. No pancreatic ductal dilatation or surrounding inflammatory changes. Spleen: Normal in size without focal abnormality. Adrenals/Urinary Tract: The adrenal glands are unremarkable. The kidneys, visualized ureters, and urinary bladder appear unremarkable. Stomach/Bowel: There is scattered colonic diverticula without active inflammatory changes. There is enhancement of the gastric mucosa, likely physiologic. Gastritis is less likely. Clinical correlation is recommended. There is no bowel obstruction. The appendix is normal. Vascular/Lymphatic: Mild aortoiliac atherosclerotic disease. The IVC is unremarkable. No portal venous gas. There is no adenopathy. Reproductive: The prostate and seminal vesicles are grossly unremarkable. No pelvic mass. Other: Small fat containing bilateral inguinal hernias. Musculoskeletal: Degenerative changes of the spine. No acute osseous pathology. IMPRESSION: 1. Cholelithiasis with findings concerning for acute cholecystitis. Further evaluation with ultrasound is recommended. 2. Colonic  diverticulosis. No bowel obstruction. Normal appendix. 3. A 1.3 cm hypoenhancing lesion in the left lobe of the liver, likely a flash filling hemangioma or a portal venous shunting. Further characterization with MRI without and with contrast on a nonemergent/outpatient basis recommended. 4.  Aortic Atherosclerosis (ICD10-I70.0). Electronically Signed   By: Elgie Collard M.D.   On: 03/30/2023 21:47    ROS:  Pertinent items are noted in HPI.  Blood pressure 131/65, pulse 61, temperature 97.8 F (36.6 C), temperature source Oral, resp. rate 19, height 4\' 7"  (1.397 m), weight 67.1 kg, SpO2 98%. Physical Exam:  General: Well-appearing and pleasant CV: Regular rate and rhythm, no murmurs Pulm: CTAB Abdominal: soft, non-distended, mildly  tender in RUQ. Negative Murphy's sign. Extremities: moves all 4 extremities without difficulty Psych: behaves appropriately   Assessment/Plan: Andrew Oconnor is a 67 y.o. male with Mhx of CHF, DMII, GERD, hyperlipidemia, RLS, HTN, thyroid disorder seen in consultation for RUQ pain, elevated Tbili 2.5, mildly elevated WBC 10.7, and CT evidence of cholelithiasis. He has had no fevers but given elevated Tbili, an MRCP was obtained to rule out choledocholithiasis. Given his reoccurring symptoms, recommend cholecystectomy. Given his extensive cardiac history, recommend cardiology consult for cardiac clearance prior to surgery. Echocardiogram from 2016 showed normal EF.  The surgery was briefly discussed with the patient and he is agreement. He still requires procedural consenting.  - Follow up MRCP - Plan for cholecystectomy on 7/24 - Heart-healthy diet - Continue mIVF - MMPC - Continue home medications, including insulin - NPO at [facility] tomorrow  Milas Hock, MS4  I have reviewed and concur with this student's documentation.   Dominica Severin 03/31/2023, 10:21 AM

## 2023-03-31 NOTE — Assessment & Plan Note (Signed)
Continue statin. 

## 2023-03-31 NOTE — Assessment & Plan Note (Signed)
-   CT abdomen pelvis shows cholecystitis with hypoenhancing lesion of left lobe of liver - Jenkins consulted and recommends ultrasound in the a.m. no plans for OR tonight - N.p.o. - Continue to monitor

## 2023-04-01 DIAGNOSIS — E039 Hypothyroidism, unspecified: Secondary | ICD-10-CM | POA: Diagnosis not present

## 2023-04-01 DIAGNOSIS — E785 Hyperlipidemia, unspecified: Secondary | ICD-10-CM | POA: Diagnosis not present

## 2023-04-01 DIAGNOSIS — K810 Acute cholecystitis: Secondary | ICD-10-CM | POA: Diagnosis not present

## 2023-04-01 DIAGNOSIS — E119 Type 2 diabetes mellitus without complications: Secondary | ICD-10-CM | POA: Diagnosis not present

## 2023-04-01 LAB — HEPATIC FUNCTION PANEL
ALT: 32 U/L (ref 0–44)
AST: 21 U/L (ref 15–41)
Albumin: 3.4 g/dL — ABNORMAL LOW (ref 3.5–5.0)
Alkaline Phosphatase: 101 U/L (ref 38–126)
Bilirubin, Direct: 0.2 mg/dL (ref 0.0–0.2)
Indirect Bilirubin: 2.4 mg/dL — ABNORMAL HIGH (ref 0.3–0.9)
Total Bilirubin: 2.6 mg/dL — ABNORMAL HIGH (ref 0.3–1.2)
Total Protein: 6.4 g/dL — ABNORMAL LOW (ref 6.5–8.1)

## 2023-04-01 LAB — BASIC METABOLIC PANEL
Anion gap: 11 (ref 5–15)
BUN: 13 mg/dL (ref 8–23)
CO2: 22 mmol/L (ref 22–32)
Calcium: 8.5 mg/dL — ABNORMAL LOW (ref 8.9–10.3)
Chloride: 106 mmol/L (ref 98–111)
Creatinine, Ser: 0.89 mg/dL (ref 0.61–1.24)
GFR, Estimated: >60 mL/min (ref >60)
Glucose, Bld: 216 mg/dL — ABNORMAL HIGH (ref 70–99)
Potassium: 3.2 mmol/L — ABNORMAL LOW (ref 3.5–5.1)
Sodium: 139 mmol/L (ref 135–145)

## 2023-04-01 LAB — CBC
HCT: 42.3 % (ref 39.0–52.0)
Hemoglobin: 14.9 g/dL (ref 13.0–17.0)
MCH: 31.5 pg (ref 26.0–34.0)
MCHC: 35.2 g/dL (ref 30.0–36.0)
MCV: 89.4 fL (ref 80.0–100.0)
Platelets: 197 10*3/uL (ref 150–400)
RBC: 4.73 MIL/uL (ref 4.22–5.81)
RDW: 13.3 % (ref 11.5–15.5)
WBC: 8.6 10*3/uL (ref 4.0–10.5)
nRBC: 0 % (ref 0.0–0.2)

## 2023-04-01 LAB — GLUCOSE, CAPILLARY
Glucose-Capillary: 232 mg/dL — ABNORMAL HIGH (ref 70–99)
Glucose-Capillary: 42 mg/dL — CL (ref 70–99)
Glucose-Capillary: 47 mg/dL — ABNORMAL LOW (ref 70–99)
Glucose-Capillary: 100 mg/dL — ABNORMAL HIGH (ref 70–99)
Glucose-Capillary: 96 mg/dL (ref 70–99)
Glucose-Capillary: 101 mg/dL — ABNORMAL HIGH (ref 70–99)
Glucose-Capillary: 122 mg/dL — ABNORMAL HIGH (ref 70–99)

## 2023-04-01 LAB — MAGNESIUM: Magnesium: 2 mg/dL (ref 1.7–2.4)

## 2023-04-01 MED ORDER — POTASSIUM CHLORIDE 20 MEQ PO PACK
20.0000 meq | PACK | Freq: Three times a day (TID) | ORAL | Status: DC
  Administered 2023-04-01 – 2023-04-03 (×6): 20 meq via ORAL
  Filled 2023-04-01 (×7): qty 1

## 2023-04-01 MED ORDER — DEXTROSE 50 % IV SOLN
1.0000 | Freq: Once | INTRAVENOUS | Status: AC
  Administered 2023-04-01: 50 mL via INTRAVENOUS

## 2023-04-01 MED ORDER — DEXTROSE 50 % IV SOLN
INTRAVENOUS | Status: AC
  Filled 2023-04-01: qty 50

## 2023-04-01 NOTE — Progress Notes (Signed)
PROGRESS NOTE   Andrew Oconnor  ZOX:[id number] DOB: June 24, 1956 DOA: 03/30/2023 PCP: [facility]   Chief Complaint  Patient presents with   Abdominal Pain   Level of care: Med-Surg  Brief Admission History:  67 y.o. male with medical history significant of CHF, diabetes mellitus type 2, GERD, hyperlipidemia, restless leg syndrome, hypertension, thyroid disorder, and more presents to the ED with a chief complaint of achy right upper quadrant pain.  Patient reports the pain started yesterday.  He has had it before 2-3 weeks ago.  It usually goes away on its own.  Yesterday did not seem like it was going to go away on its own.  Did not occur around mealtime.  Patient reports it was uncomfortable and dull in quality.  It was in the right upper quadrant and radiating to the epigastric region.  He was nauseous but did not have vomiting.  He did have vomiting the last time he had this pain.  There was no hematemesis.  Patient reports his last meal was dinner time and it was a hot dog.  He has pain that started before that meal.  For lunch time he had tomato sandwich.  His last normal bowel movement was 2 days ago.  He did have a bowel movement yesterday but it was less than what is normal for him.  Although his complaint in the ER was at some point documented his difficulty with urination, patient reports he had no difficulty with voiding.  Patient denies any fevers.  He does report that he had some diaphoresis that felt like a clammy sweat a couple of times yesterday.  He denies any chest pain or dyspnea.  Patient has no other complaints at this time.    Assessment and Plan:  Acute cholecystitis - CT abdomen pelvis shows cholecystitis with hypoenhancing lesion of left lobe of liver - Jenkins consulted and recommending ultrasound and MRCP no choledocholithiasis found - surgery team requested cardiology preoperative evaluation, pt now cleared for surgery - discussed with  surgery, planning OR robotic lap chole 7/24   Hypokalemia - Potassium up to 3.5 after treatment, give additional IV Mg 7/22  - recheck BMP in AM - oral K 20 meq TID on today's MAR  Chronic Restless leg syndrome  - Patient reports that he failed many outpatient medications for restless leg and he ended up on levodopa/carbidopa for this concern - Continue levodopa/carbidopa  Hypertension - Continue home benazepril, metoprolol  Hyperlipidemia - Continue pravastatin  Hypothyroidism - Continue Synthroid  Diabetes mellitus type 2, tightly controlled (A1c 5.8%) - He requested to use his insulin pump in hospital, I explained to him he may need to remove it temporarily for surgery - insulin pump order set initiated and consult to DM coordinator to work with him while on pump for safety - Pt has a CGM on his left arm that communicates with pump and alerts with hypoglycemia  Hypoglycemia from insulin  -- suspect he overcorrected yesterday from the hyperglycemia he had when pump was disconnected; he has a CGM on his left arm, he is monitoring his CBG constantly now; I have asked him to try to tolerate CBG 140-180 while in hospital and no need to tightly correct his BS now   DVT prophylaxis: SCDs Code Status: Full  Family Communication:  Disposition: anticipate home    Consultants:  Surgery Cardiology for preop clearance  DM coordinator  Procedures:   Antimicrobials:    Subjective: Pt had hypoglycemia early  this morning, thinks he took too much correction dose yesterday; he has a CGM on his left arm and monitoring CBG very closely now.     Objective: Vitals:   03/31/23 1500 03/31/23 2123 04/01/23 0510 04/01/23 1410  BP: 138/64 (!) 110/59 125/62 (!) 121/59  Pulse: 69 61 60 (!) 58  Resp: 18 18 18 20   Temp: 97.8 F (36.6 C) 98.4 F (36.9 C) 97.9 F (36.6 C) 98 F (36.7 C)  TempSrc: Oral  Oral Oral  SpO2: 98% 98% 99% 97%  Weight:      Height:        Intake/Output Summary  (Last 24 hours) at 04/01/2023 1509 Last data filed at 04/01/2023 1434 Gross per 24 hour  Intake 1320 ml  Output --  Net 1320 ml   Filed Weights   03/30/23 1935  Weight: 67.1 kg   Examination:  General exam: Appears calm and comfortable  Respiratory system: Clear to auscultation. Respiratory effort normal. Cardiovascular system: normal S1 & S2 heard. No JVD, murmurs, rubs, gallops or clicks. No pedal edema. Gastrointestinal system: Abdomen is nondistended, soft and tender RUQ area no guarding. No organomegaly or masses felt. Normal bowel sounds heard. Central nervous system: Alert and oriented. No focal neurological deficits. Extremities: Symmetric 5 x 5 power. Skin: No rashes, lesions or ulcers. Psychiatry: Judgement and insight appear normal. Mood & affect appropriate.   Data Reviewed: I have personally reviewed following labs and imaging studies  CBC: Recent Labs  Lab 03/30/23 1940 03/31/23 0356 04/01/23 0405  WBC 9.9 10.7* 8.6  NEUTROABS  --  7.1  --   HGB 17.0 14.5 14.9  HCT 49.1 41.0 42.3  MCV 89.9 88.9 89.4  PLT 242 202 197    Basic Metabolic Panel: Recent Labs  Lab 03/30/23 1940 03/31/23 0356 04/01/23 0405  NA 139 139 139  K 3.4* 3.5 3.2*  CL 105 110 106  CO2 27 22 22   GLUCOSE 92 77 216*  BUN 11 12 13   CREATININE 0.88 0.78 0.89  CALCIUM 8.6* 8.0* 8.5*  MG  --  1.8 2.0    CBG: Recent Labs  Lab 04/01/23 0233 04/01/23 0252 04/01/23 0325 04/01/23 0730 04/01/23 1104  GLUCAP 42* 47* 232* 100* 96    No results found for this or any previous visit (from the past 240 hour(s)).   Radiology Studies: ECHOCARDIOGRAM COMPLETE  Result Date: 03/31/2023    ECHOCARDIOGRAM REPORT   Patient Name:   Andrew Oconnor Date of Exam: 03/31/2023 Medical Rec #:  [id number]        Height:       55.0 in Accession #:    [id number]       Weight:       148.0 lb Date of Birth:  September 16, 1955        BSA:          1.542 m Patient Age:    66 years         BP:           131/65 mmHg  Patient Gender: M                HR:           69 bpm. [facility] Procedure: 2D Echo, Cardiac Doppler and Color Doppler Indications:    Z01.818 Encounter for other preprocedural examination  History:        Patient has prior history of Echocardiogram examinations, most  recent 11/25/2014. Signs/Symptoms:Chest Pain and Dyspnea; Risk                 Factors:Diabetes and Dyslipidemia. ETOH.  Sonographer:    Sheralyn Boatman RDCS Referring Phys: [phone] Bahar Shelden L Anique Beckley IMPRESSIONS  1. Left ventricular ejection fraction, by estimation, is 65 to 70%. The left ventricle has normal function. The left ventricle has no regional wall motion abnormalities. Left ventricular diastolic parameters are consistent with Grade I diastolic dysfunction (impaired relaxation).  2. Right ventricular systolic function is normal. The right ventricular size is normal.  3. The mitral valve is grossly normal. Mild mitral valve regurgitation.  4. The aortic valve is tricuspid. Aortic valve regurgitation is not visualized. Aortic valve sclerosis is present, with no evidence of aortic valve stenosis.  5. The inferior vena cava is dilated in size with <50% respiratory variability, suggesting right atrial pressure of 15 mmHg. FINDINGS  Left Ventricle: Left ventricular ejection fraction, by estimation, is 65 to 70%. The left ventricle has normal function. The left ventricle has no regional wall motion abnormalities. The left ventricular internal cavity size was normal in size. There is  no left ventricular hypertrophy. Left ventricular diastolic parameters are consistent with Grade I diastolic dysfunction (impaired relaxation). Right Ventricle: The right ventricular size is normal. No increase in right ventricular wall thickness. Right ventricular systolic function is normal. Left Atrium: Left atrial size was normal in size. Right Atrium: Right atrial size was normal in size. Pericardium: There is no evidence of pericardial  effusion. Mitral Valve: The mitral valve is grossly normal. Mild mitral valve regurgitation. Tricuspid Valve: The tricuspid valve is normal in structure. Tricuspid valve regurgitation is trivial. Aortic Valve: The aortic valve is tricuspid. Aortic valve regurgitation is not visualized. Aortic valve sclerosis is present, with no evidence of aortic valve stenosis. Pulmonic Valve: The pulmonic valve was grossly normal. Pulmonic valve regurgitation is not visualized. Aorta: The aortic root and ascending aorta are structurally normal, with no evidence of dilitation. Venous: The inferior vena cava is dilated in size with less than 50% respiratory variability, suggesting right atrial pressure of 15 mmHg. IAS/Shunts: No atrial level shunt detected by color flow Doppler.  LEFT VENTRICLE PLAX 2D LVIDd:         4.10 cm     Diastology LVIDs:         2.60 cm     LV e' medial:    7.07 cm/s LV PW:         1.00 cm     LV E/e' medial:  16.7 LV IVS:        1.00 cm     LV e' lateral:   7.83 cm/s LVOT diam:     2.00 cm     LV E/e' lateral: 15.1 LV SV:         78 LV SV Index:   50 LVOT Area:     3.14 cm  LV Volumes (MOD) LV vol d, MOD A2C: 64.0 ml LV vol d, MOD A4C: 73.9 ml LV vol s, MOD A2C: 18.9 ml LV vol s, MOD A4C: 21.3 ml LV SV MOD A2C:     45.1 ml LV SV MOD A4C:     73.9 ml LV SV MOD BP:      49.5 ml RIGHT VENTRICLE             IVC RV S prime:     10.20 cm/s  IVC diam: 2.00 cm TAPSE (M-mode): 2.1 cm LEFT ATRIUM  Index        RIGHT ATRIUM           Index LA diam:        3.50 cm 2.27 cm/m   RA Area:     10.50 cm LA Vol (A2C):   30.1 ml 19.52 ml/m  RA Volume:   19.30 ml  12.52 ml/m LA Vol (A4C):   36.6 ml 23.74 ml/m LA Biplane Vol: 35.9 ml 23.28 ml/m  AORTIC VALVE LVOT Vmax:   116.00 cm/s LVOT Vmean:  75.000 cm/s LVOT VTI:    0.247 m  AORTA Ao Root diam: 2.90 cm Ao Asc diam:  3.70 cm MITRAL VALVE MV Area (PHT): 4.31 cm     SHUNTS MV Decel Time: 176 msec     Systemic VTI:  0.25 m MV E velocity: 118.00 cm/s  Systemic  Diam: 2.00 cm MV A velocity: 113.00 cm/s MV E/A ratio:  1.04 Dietrich Pates MD Electronically signed by Dietrich Pates MD Signature Date/Time: 03/31/2023/4:24:09 PM    Final    US Carotid Bilateral  Result Date: 03/31/2023 CLINICAL DATA:  67 year old male with right carotid bruit EXAM: BILATERAL CAROTID DUPLEX ULTRASOUND TECHNIQUE: Wallace Cullens scale imaging, color Doppler and duplex ultrasound were performed of bilateral carotid and vertebral arteries in the neck. COMPARISON:  None Available. FINDINGS: Criteria: Quantification of carotid stenosis is based on velocity parameters that correlate the residual internal carotid diameter with NASCET-based stenosis levels, using the diameter of the distal internal carotid lumen as the denominator for stenosis measurement. The following velocity measurements were obtained: RIGHT ICA:  Systolic 104 cm/sec, Diastolic 16 cm/sec CCA:  104 cm/sec SYSTOLIC ICA/CCA RATIO:  1.0 ECA:  175 cm/sec LEFT ICA:  Systolic 92 cm/sec, Diastolic 20 cm/sec CCA:  123 cm/sec SYSTOLIC ICA/CCA RATIO:  0.7 ECA:  159 cm/sec Right Brachial SBP: Not acquired Left Brachial SBP: Not acquired RIGHT CAROTID ARTERY: No significant calcified disease of the right common carotid artery. Intermediate waveform maintained. Homogeneous plaque without significant calcifications at the right carotid bifurcation. Low resistance waveform of the right ICA. No significant tortuosity. RIGHT VERTEBRAL ARTERY: Antegrade flow with low resistance waveform. LEFT CAROTID ARTERY: No significant calcified disease of the left common carotid artery. Intermediate waveform maintained. Homogeneous plaque at the left carotid bifurcation without significant calcifications. Low resistance waveform of the left ICA. LEFT VERTEBRAL ARTERY:  Antegrade flow with low resistance waveform. IMPRESSION: Color duplex indicates minimal homogeneous plaque, with no hemodynamically significant stenosis by duplex criteria in the extracranial cerebrovascular  circulation. Signed, Yvone Neu. Miachel Roux, RPVI [facility] [place] [facility] Electronically Signed   By: Gilmer Mor D.O.   On: 03/31/2023 14:01   US Abdomen Limited RUQ (LIVER/GB)  Result Date: 03/31/2023 CLINICAL DATA:  6194 Acute cholecystitis 6194 EXAM: ULTRASOUND ABDOMEN LIMITED RIGHT UPPER QUADRANT COMPARISON:  03/30/2023 FINDINGS: Gallbladder: Abnormal gallbladder wall thickening measuring up to 7 mm. Layering sludge and small stones within the gallbladder. No sonographic Murphy sign noted by sonographer. Common bile duct: Diameter: 6 mm. Liver: 1.8 cm echogenic lesion within the subcapsular aspect of the left hepatic lobe compatible with hemangioma. No additional liver lesion. Mildly increased hepatic parenchymal echogenicity. Portal vein is patent on color Doppler imaging with normal direction of blood flow towards the liver. Other: None. IMPRESSION: 1. Cholelithiasis with abnormal gallbladder wall thickening, suspicious for cholecystitis. No sonographic Eulah Pont sign was reported on exam. Correlate for pain medication administration. 2. Mildly increased hepatic parenchymal echogenicity, which is nonspecific but can be  seen in the setting of hepatic steatosis. 3. Solitary 1.8 cm echogenic lesion within the left hepatic lobe compatible with hemangioma. Electronically Signed   By: Duanne Guess D.O.   On: 03/31/2023 13:00   MR ABDOMEN WITH MRCP W CONTRAST  Result Date: 03/31/2023 CLINICAL DATA:  Upper abdominal pain. Cholelithiasis. Indeterminate liver lesion on recent CT. EXAM: MRI ABDOMEN WITH CONTRAST (WITH MRCP) TECHNIQUE: Multiplanar multisequence MR imaging of the abdomen was performed following the administration of intravenous contrast. Heavily T2-weighted images of the biliary and pancreatic ducts were obtained, and three-dimensional MRCP images were rendered by post processing. CONTRAST:  7mL GADAVIST GADOBUTROL 1 MMOL/ML IV SOLN COMPARISON:   CT on 03/30/2023 FINDINGS: Lower chest: No acute findings. Hepatobiliary: A 1.9 cm subcapsular flash-filling hemangioma is seen anterior left hepatic lobe, corresponding with the liver lesion seen on recent CT. No other hepatic masses are identified. Multiple tiny gallstones are seen. Respiratory motion artifact causes image degradation. Gallbladder is mildly distended and shows mild diffuse wall thickening and contrast enhancement, suspicious for acute cholecystitis. No evidence of biliary ductal dilatation. Pancreas:  No mass or inflammatory changes. Spleen:  Within normal limits in size and appearance. Adrenals/Urinary Tract: No suspicious masses identified. No evidence of hydronephrosis. Stomach/Bowel: Unremarkable. Vascular/Lymphatic: No pathologically enlarged lymph nodes identified. No acute vascular findings. Other:  None. Musculoskeletal:  No suspicious bone lesions identified. IMPRESSION: Cholelithiasis, with findings suspicious for acute cholecystitis. No evidence of biliary ductal dilatation. 1.9 cm benign flash-filling hemangioma in anterior left hepatic lobe, corresponding with the liver lesion seen on recent CT. Electronically Signed   By: Danae Orleans M.D.   On: 03/31/2023 11:47   MR 3D Recon At Scanner  Result Date: 03/31/2023 CLINICAL DATA:  Upper abdominal pain. Cholelithiasis. Indeterminate liver lesion on recent CT. EXAM: MRI ABDOMEN WITH CONTRAST (WITH MRCP) TECHNIQUE: Multiplanar multisequence MR imaging of the abdomen was performed following the administration of intravenous contrast. Heavily T2-weighted images of the biliary and pancreatic ducts were obtained, and three-dimensional MRCP images were rendered by post processing. CONTRAST:  7mL GADAVIST GADOBUTROL 1 MMOL/ML IV SOLN COMPARISON:  CT on 03/30/2023 FINDINGS: Lower chest: No acute findings. Hepatobiliary: A 1.9 cm subcapsular flash-filling hemangioma is seen anterior left hepatic lobe, corresponding with the liver lesion seen on  recent CT. No other hepatic masses are identified. Multiple tiny gallstones are seen. Respiratory motion artifact causes image degradation. Gallbladder is mildly distended and shows mild diffuse wall thickening and contrast enhancement, suspicious for acute cholecystitis. No evidence of biliary ductal dilatation. Pancreas:  No mass or inflammatory changes. Spleen:  Within normal limits in size and appearance. Adrenals/Urinary Tract: No suspicious masses identified. No evidence of hydronephrosis. Stomach/Bowel: Unremarkable. Vascular/Lymphatic: No pathologically enlarged lymph nodes identified. No acute vascular findings. Other:  None. Musculoskeletal:  No suspicious bone lesions identified. IMPRESSION: Cholelithiasis, with findings suspicious for acute cholecystitis. No evidence of biliary ductal dilatation. 1.9 cm benign flash-filling hemangioma in anterior left hepatic lobe, corresponding with the liver lesion seen on recent CT. Electronically Signed   By: Danae Orleans M.D.   On: 03/31/2023 11:47   CT ABDOMEN PELVIS W CONTRAST  Result Date: 03/30/2023 CLINICAL DATA:  Epigastric pain. EXAM: CT ABDOMEN AND PELVIS WITH CONTRAST TECHNIQUE: Multidetector CT imaging of the abdomen and pelvis was performed using the standard protocol following bolus administration of intravenous contrast. RADIATION DOSE REDUCTION: This exam was performed according to the departmental dose-optimization program which includes automated exposure control, adjustment of the mA and/or kV according to patient size  and/or use of iterative reconstruction technique. CONTRAST:  OMNIPAQUE IOHEXOL 300 MG/ML  SOLN COMPARISON:  None Available. FINDINGS: Lower chest: The visualized lung bases are clear. No intra-abdominal free air or free fluid. Hepatobiliary: There is a 1.3 cm hypoenhancing lesion in the left lobe of the liver which is not characterized on this CT but likely represents a flash filling hemangioma or a portal venous shunting.  This likely corresponds to the hypoechoic lesion seen on the ultrasound of 04/21/2013. Further characterization with MRI without and with contrast on a nonemergent/outpatient basis recommended. No biliary dilatation. There multiple stones within the gallbladder. There is mild haziness of the gallbladder wall with trace pericholecystic fluid concerning for acute cholecystitis. Further evaluation with ultrasound is recommended. Pancreas: Unremarkable. No pancreatic ductal dilatation or surrounding inflammatory changes. Spleen: Normal in size without focal abnormality. Adrenals/Urinary Tract: The adrenal glands are unremarkable. The kidneys, visualized ureters, and urinary bladder appear unremarkable. Stomach/Bowel: There is scattered colonic diverticula without active inflammatory changes. There is enhancement of the gastric mucosa, likely physiologic. Gastritis is less likely. Clinical correlation is recommended. There is no bowel obstruction. The appendix is normal. Vascular/Lymphatic: Mild aortoiliac atherosclerotic disease. The IVC is unremarkable. No portal venous gas. There is no adenopathy. Reproductive: The prostate and seminal vesicles are grossly unremarkable. No pelvic mass. Other: Small fat containing bilateral inguinal hernias. Musculoskeletal: Degenerative changes of the spine. No acute osseous pathology. IMPRESSION: 1. Cholelithiasis with findings concerning for acute cholecystitis. Further evaluation with ultrasound is recommended. 2. Colonic diverticulosis. No bowel obstruction. Normal appendix. 3. A 1.3 cm hypoenhancing lesion in the left lobe of the liver, likely a flash filling hemangioma or a portal venous shunting. Further characterization with MRI without and with contrast on a nonemergent/outpatient basis recommended. 4.  Aortic Atherosclerosis (ICD10-I70.0). Electronically Signed   By: Elgie Collard M.D.   On: 03/30/2023 21:47    Scheduled Meds:  benazepril  5 mg Oral Daily    Carbidopa-Levodopa ER  1 tablet Oral BID   insulin pump   Subcutaneous TID WC, HS, 0200   levothyroxine  125 mcg Oral Q0600   melatonin  6 mg Oral QHS   metoprolol succinate  25 mg Oral Daily   pantoprazole  40 mg Oral Daily   potassium chloride  20 mEq Oral TID   pravastatin  40 mg Oral Daily   spironolactone  25 mg Oral Daily   Continuous Infusions:  sodium chloride 10 mL/hr at 04/01/23 1230     LOS: 0 days   Time spent: 38 mins  Bedelia Pong Laural Benes, MD How to contact the [facility] Attending or Consulting provider 7A - 7P or covering provider during after hours 7P -7A, for this patient?  Check the care team in [facility] and look for a) attending/consulting [facility] provider listed and b) the [facility] team listed Log into [email or web address] and use [facility] universal password to access. If you do not have the password, please contact the hospital operator. Locate the [facility] provider you are looking for under [facility] and page to a number that you can be directly reached. If you still have difficulty reaching the provider, please page the [facility] (Director on Call) for the Hospitalists listed on amion for assistance.  04/01/2023, 3:09 PM

## 2023-04-01 NOTE — Progress Notes (Signed)
Echo is benign, no additional cardiac testing is planned. Ok to proceed with surgery from cardiac standpoint, we will sign off inpatient care.   Dina Rich MD

## 2023-04-01 NOTE — H&P (View-Only) (Signed)
Subjective: Patient has no significant abdominal complaints.  Objective: Vital signs in last 24 hours: Temp:  [97.8 F (36.6 C)-98.4 F (36.9 C)] 97.9 F (36.6 C) (07/23 0510) Pulse Rate:  [60-69] 60 (07/23 0510) Resp:  [18] 18 (07/23 0510) BP: (110-138)/(59-64) 125/62 (07/23 0510) SpO2:  [98 %-99 %] 99 % (07/23 0510) Last BM Date : 03/30/23  Intake/Output from previous day: 07/22 0701 - 07/23 0700 In: 1560 [P.O.:960; I.V.:600] Out: -  Intake/Output this shift: Total I/O In: 240 [P.O.:240] Out: -   General appearance: alert, cooperative, and no distress GI: soft, non-tender; bowel sounds normal; no masses,  no organomegaly  Lab Results:  Recent Labs    03/31/23 0356 04/01/23 0405  WBC 10.7* 8.6  HGB 14.5 14.9  HCT 41.0 42.3  PLT 202 197   BMET Recent Labs    03/31/23 0356 04/01/23 0405  NA 139 139  K 3.5 3.2*  CL 110 106  CO2 22 22  GLUCOSE 77 216*  BUN 12 13  CREATININE 0.78 0.89  CALCIUM 8.0* 8.5*   PT/INR No results for input(s): "LABPROT", "INR" in the last 72 hours.  Studies/Results: ECHOCARDIOGRAM COMPLETE  Result Date: 03/31/2023    ECHOCARDIOGRAM REPORT   Patient Name:   Andrew Oconnor Date of Exam: 03/31/2023 Medical Rec #:  [id number]        Height:       55.0 in Accession #:    [id number]       Weight:       148.0 lb Date of Birth:  Apr 12, 1956        BSA:          1.542 m Patient Age:    66 years         BP:           131/65 mmHg Patient Gender: M                HR:           69 bpm. [facility] Procedure: 2D Echo, Cardiac Doppler and Color Doppler Indications:    Z01.818 Encounter for other preprocedural examination  History:        Patient has prior history of Echocardiogram examinations, most                 recent 11/25/2014. Signs/Symptoms:Chest Pain and Dyspnea; Risk                 Factors:Diabetes and Dyslipidemia. ETOH.  Sonographer:    Sheralyn Boatman RDCS Referring Phys: [phone] [facility] L [facility]NS[facility] IMPRESSIONS  1. Left ventricular  ejection fraction, by estimation, is 65 to 70%. The left ventricle has normal function. The left ventricle has no regional wall motion abnormalities. Left ventricular diastolic parameters are consistent with Grade I diastolic dysfunction (impaired relaxation).  2. Right ventricular systolic function is normal. The right ventricular size is normal.  3. The mitral valve is grossly normal. Mild mitral valve regurgitation.  4. The aortic valve is tricuspid. Aortic valve regurgitation is not visualized. Aortic valve sclerosis is present, with no evidence of aortic valve stenosis.  5. The inferior vena cava is dilated in size with <50% respiratory variability, suggesting right atrial pressure of 15 mmHg. FINDINGS  Left Ventricle: Left ventricular ejection fraction, by estimation, is 65 to 70%. The left ventricle has normal function. The left ventricle has no regional wall motion abnormalities. The left ventricular internal cavity size was normal in size. There is  no left ventricular hypertrophy. Left ventricular diastolic parameters are consistent with Grade I diastolic dysfunction (impaired relaxation). Right Ventricle: The right ventricular size is normal. No increase in right ventricular wall thickness. Right ventricular systolic function is normal. Left Atrium: Left atrial size was normal in size. Right Atrium: Right atrial size was normal in size. Pericardium: There is no evidence of pericardial effusion. Mitral Valve: The mitral valve is grossly normal. Mild mitral valve regurgitation. Tricuspid Valve: The tricuspid valve is normal in structure. Tricuspid valve regurgitation is trivial. Aortic Valve: The aortic valve is tricuspid. Aortic valve regurgitation is not visualized. Aortic valve sclerosis is present, with no evidence of aortic valve stenosis. Pulmonic Valve: The pulmonic valve was grossly normal. Pulmonic valve regurgitation is not visualized. Aorta: The aortic root and ascending aorta are structurally  normal, with no evidence of dilitation. Venous: The inferior vena cava is dilated in size with less than 50% respiratory variability, suggesting right atrial pressure of 15 mmHg. IAS/Shunts: No atrial level shunt detected by color flow Doppler.  LEFT VENTRICLE PLAX 2D LVIDd:         4.10 cm     Diastology LVIDs:         2.60 cm     LV e' medial:    7.07 cm/s LV PW:         1.00 cm     LV E/e' medial:  16.7 LV IVS:        1.00 cm     LV e' lateral:   7.83 cm/s LVOT diam:     2.00 cm     LV E/e' lateral: 15.1 LV SV:         78 LV SV Index:   50 LVOT Area:     3.14 cm  LV Volumes (MOD) LV vol d, MOD A2C: 64.0 ml LV vol d, MOD A4C: 73.9 ml LV vol s, MOD A2C: 18.9 ml LV vol s, MOD A4C: 21.3 ml LV SV MOD A2C:     45.1 ml LV SV MOD A4C:     73.9 ml LV SV MOD BP:      49.5 ml RIGHT VENTRICLE             IVC RV S prime:     10.20 cm/s  IVC diam: 2.00 cm TAPSE (M-mode): 2.1 cm LEFT ATRIUM             Index        RIGHT ATRIUM           Index LA diam:        3.50 cm 2.27 cm/m   RA Area:     10.50 cm LA Vol (A2C):   30.1 ml 19.52 ml/m  RA Volume:   19.30 ml  12.52 ml/m LA Vol (A4C):   36.6 ml 23.74 ml/m LA Biplane Vol: 35.9 ml 23.28 ml/m  AORTIC VALVE LVOT Vmax:   116.00 cm/s LVOT Vmean:  75.000 cm/s LVOT VTI:    0.247 m  AORTA Ao Root diam: 2.90 cm Ao Asc diam:  3.70 cm MITRAL VALVE MV Area (PHT): 4.31 cm     SHUNTS MV Decel Time: 176 msec     Systemic VTI:  0.25 m MV E velocity: 118.00 cm/s  Systemic Diam: 2.00 cm MV A velocity: 113.00 cm/s MV E/A ratio:  1.04 Dietrich Pates MD Electronically signed by Dietrich Pates MD Signature Date/Time: 03/31/2023/4:24:09 PM    Final    US Carotid Bilateral  Result Date:  03/31/2023 CLINICAL DATA:  67 year old male with right carotid bruit EXAM: BILATERAL CAROTID DUPLEX ULTRASOUND TECHNIQUE: Wallace Cullens scale imaging, color Doppler and duplex ultrasound were performed of bilateral carotid and vertebral arteries in the neck. COMPARISON:  None Available. FINDINGS: Criteria: Quantification of  carotid stenosis is based on velocity parameters that correlate the residual internal carotid diameter with NASCET-based stenosis levels, using the diameter of the distal internal carotid lumen as the denominator for stenosis measurement. The following velocity measurements were obtained: RIGHT ICA:  Systolic 104 cm/sec, Diastolic 16 cm/sec CCA:  104 cm/sec SYSTOLIC ICA/CCA RATIO:  1.0 ECA:  175 cm/sec LEFT ICA:  Systolic 92 cm/sec, Diastolic 20 cm/sec CCA:  123 cm/sec SYSTOLIC ICA/CCA RATIO:  0.7 ECA:  159 cm/sec Right Brachial SBP: Not acquired Left Brachial SBP: Not acquired RIGHT CAROTID ARTERY: No significant calcified disease of the right common carotid artery. Intermediate waveform maintained. Homogeneous plaque without significant calcifications at the right carotid bifurcation. Low resistance waveform of the right ICA. No significant tortuosity. RIGHT VERTEBRAL ARTERY: Antegrade flow with low resistance waveform. LEFT CAROTID ARTERY: No significant calcified disease of the left common carotid artery. Intermediate waveform maintained. Homogeneous plaque at the left carotid bifurcation without significant calcifications. Low resistance waveform of the left ICA. LEFT VERTEBRAL ARTERY:  Antegrade flow with low resistance waveform. IMPRESSION: Color duplex indicates minimal homogeneous plaque, with no hemodynamically significant stenosis by duplex criteria in the extracranial cerebrovascular circulation. Signed, Yvone Neu. Miachel Roux, RPVI [facility] Electronically Signed   By: Gilmer Mor D.O.   On: 03/31/2023 14:01   US Abdomen Limited RUQ (LIVER/GB)  Result Date: 03/31/2023 CLINICAL DATA:  6194 Acute cholecystitis 6194 EXAM: ULTRASOUND ABDOMEN LIMITED RIGHT UPPER QUADRANT COMPARISON:  03/30/2023 FINDINGS: Gallbladder: Abnormal gallbladder wall thickening measuring up to 7 mm. Layering sludge and small stones within the gallbladder. No  sonographic Murphy sign noted by sonographer. Common bile duct: Diameter: 6 mm. Liver: 1.8 cm echogenic lesion within the subcapsular aspect of the left hepatic lobe compatible with hemangioma. No additional liver lesion. Mildly increased hepatic parenchymal echogenicity. Portal vein is patent on color Doppler imaging with normal direction of blood flow towards the liver. Other: None. IMPRESSION: 1. Cholelithiasis with abnormal gallbladder wall thickening, suspicious for cholecystitis. No sonographic Eulah Pont sign was reported on exam. Correlate for pain medication administration. 2. Mildly increased hepatic parenchymal echogenicity, which is nonspecific but can be seen in the setting of hepatic steatosis. 3. Solitary 1.8 cm echogenic lesion within the left hepatic lobe compatible with hemangioma. Electronically Signed   By: Duanne Guess D.O.   On: 03/31/2023 13:00   MR ABDOMEN WITH MRCP W CONTRAST  Result Date: 03/31/2023 CLINICAL DATA:  Upper abdominal pain. Cholelithiasis. Indeterminate liver lesion on recent CT. EXAM: MRI ABDOMEN WITH CONTRAST (WITH MRCP) TECHNIQUE: Multiplanar multisequence MR imaging of the abdomen was performed following the administration of intravenous contrast. Heavily T2-weighted images of the biliary and pancreatic ducts were obtained, and three-dimensional MRCP images were rendered by post processing. CONTRAST:  7mL GADAVIST GADOBUTROL 1 MMOL/ML IV SOLN COMPARISON:  CT on 03/30/2023 FINDINGS: Lower chest: No acute findings. Hepatobiliary: A 1.9 cm subcapsular flash-filling hemangioma is seen anterior left hepatic lobe, corresponding with the liver lesion seen on recent CT. No other hepatic masses are identified. Multiple tiny gallstones are seen. Respiratory motion artifact causes image degradation. Gallbladder is mildly distended and shows mild diffuse wall thickening and contrast enhancement, suspicious for acute cholecystitis. No evidence of biliary ductal dilatation.  Pancreas:   No mass or inflammatory changes. Spleen:  Within normal limits in size and appearance. Adrenals/Urinary Tract: No suspicious masses identified. No evidence of hydronephrosis. Stomach/Bowel: Unremarkable. Vascular/Lymphatic: No pathologically enlarged lymph nodes identified. No acute vascular findings. Other:  None. Musculoskeletal:  No suspicious bone lesions identified. IMPRESSION: Cholelithiasis, with findings suspicious for acute cholecystitis. No evidence of biliary ductal dilatation. 1.9 cm benign flash-filling hemangioma in anterior left hepatic lobe, corresponding with the liver lesion seen on recent CT. Electronically Signed   By: Danae Orleans M.D.   On: 03/31/2023 11:47   MR 3D Recon At Scanner  Result Date: 03/31/2023 CLINICAL DATA:  Upper abdominal pain. Cholelithiasis. Indeterminate liver lesion on recent CT. EXAM: MRI ABDOMEN WITH CONTRAST (WITH MRCP) TECHNIQUE: Multiplanar multisequence MR imaging of the abdomen was performed following the administration of intravenous contrast. Heavily T2-weighted images of the biliary and pancreatic ducts were obtained, and three-dimensional MRCP images were rendered by post processing. CONTRAST:  7mL GADAVIST GADOBUTROL 1 MMOL/ML IV SOLN COMPARISON:  CT on 03/30/2023 FINDINGS: Lower chest: No acute findings. Hepatobiliary: A 1.9 cm subcapsular flash-filling hemangioma is seen anterior left hepatic lobe, corresponding with the liver lesion seen on recent CT. No other hepatic masses are identified. Multiple tiny gallstones are seen. Respiratory motion artifact causes image degradation. Gallbladder is mildly distended and shows mild diffuse wall thickening and contrast enhancement, suspicious for acute cholecystitis. No evidence of biliary ductal dilatation. Pancreas:  No mass or inflammatory changes. Spleen:  Within normal limits in size and appearance. Adrenals/Urinary Tract: No suspicious masses identified. No evidence of hydronephrosis. Stomach/Bowel:  Unremarkable. Vascular/Lymphatic: No pathologically enlarged lymph nodes identified. No acute vascular findings. Other:  None. Musculoskeletal:  No suspicious bone lesions identified. IMPRESSION: Cholelithiasis, with findings suspicious for acute cholecystitis. No evidence of biliary ductal dilatation. 1.9 cm benign flash-filling hemangioma in anterior left hepatic lobe, corresponding with the liver lesion seen on recent CT. Electronically Signed   By: Danae Orleans M.D.   On: 03/31/2023 11:47   CT ABDOMEN PELVIS W CONTRAST  Result Date: 03/30/2023 CLINICAL DATA:  Epigastric pain. EXAM: CT ABDOMEN AND PELVIS WITH CONTRAST TECHNIQUE: Multidetector CT imaging of the abdomen and pelvis was performed using the standard protocol following bolus administration of intravenous contrast. RADIATION DOSE REDUCTION: This exam was performed according to the departmental dose-optimization program which includes automated exposure control, adjustment of the mA and/or kV according to patient size and/or use of iterative reconstruction technique. CONTRAST:  OMNIPAQUE IOHEXOL 300 MG/ML  SOLN COMPARISON:  None Available. FINDINGS: Lower chest: The visualized lung bases are clear. No intra-abdominal free air or free fluid. Hepatobiliary: There is a 1.3 cm hypoenhancing lesion in the left lobe of the liver which is not characterized on this CT but likely represents a flash filling hemangioma or a portal venous shunting. This likely corresponds to the hypoechoic lesion seen on the ultrasound of 04/21/2013. Further characterization with MRI without and with contrast on a nonemergent/outpatient basis recommended. No biliary dilatation. There multiple stones within the gallbladder. There is mild haziness of the gallbladder wall with trace pericholecystic fluid concerning for acute cholecystitis. Further evaluation with ultrasound is recommended. Pancreas: Unremarkable. No pancreatic ductal dilatation or surrounding inflammatory  changes. Spleen: Normal in size without focal abnormality. Adrenals/Urinary Tract: The adrenal glands are unremarkable. The kidneys, visualized ureters, and urinary bladder appear unremarkable. Stomach/Bowel: There is scattered colonic diverticula without active inflammatory changes. There is enhancement of the gastric mucosa, likely physiologic. Gastritis is less likely. Clinical correlation  is recommended. There is no bowel obstruction. The appendix is normal. Vascular/Lymphatic: Mild aortoiliac atherosclerotic disease. The IVC is unremarkable. No portal venous gas. There is no adenopathy. Reproductive: The prostate and seminal vesicles are grossly unremarkable. No pelvic mass. Other: Small fat containing bilateral inguinal hernias. Musculoskeletal: Degenerative changes of the spine. No acute osseous pathology. IMPRESSION: 1. Cholelithiasis with findings concerning for acute cholecystitis. Further evaluation with ultrasound is recommended. 2. Colonic diverticulosis. No bowel obstruction. Normal appendix. 3. A 1.3 cm hypoenhancing lesion in the left lobe of the liver, likely a flash filling hemangioma or a portal venous shunting. Further characterization with MRI without and with contrast on a nonemergent/outpatient basis recommended. 4.  Aortic Atherosclerosis (ICD10-I70.0). Electronically Signed   By: Elgie Collard M.D.   On: 03/30/2023 21:47    Anti-infectives: Anti-infectives (From admission, onward)    None       Assessment/Plan: Impression: Biliary colic secondary to cholelithiasis.  MRCP negative for choledocholithiasis.  Duplex ultrasound of carotids unremarkable.  2D echo unremarkable.  Appreciate cardiac consultation for clearance.  Will proceed with robotic assisted laparoscopic cholecystectomy tomorrow.  The risks and benefits of the procedure including bleeding, infection, hepatobiliary injury, and the possibility of an open procedure were fully explained to the patient, who gave informed  consent.  LOS: 0 days    Franky Macho 04/01/2023

## 2023-04-01 NOTE — Progress Notes (Signed)
Patient experienced hypoglycemia at 0200. Checked glucose with hospital equipment. Insulin pump alarm was noted by patient, but patient did not notify team. It was unclear if the insulin recently refilled in the pump was consistent with patient's home regimen. Administered D50 according to hypoglycemic protocol. No immediate complications noted. Continue monitoring blood glucose levels per protocol and will continue to monitor.

## 2023-04-01 NOTE — Progress Notes (Signed)
Subjective: Patient has no significant abdominal complaints.  Objective: Vital signs in last 24 hours: Temp:  [97.8 F (36.6 C)-98.4 F (36.9 C)] 97.9 F (36.6 C) (07/23 0510) Pulse Rate:  [60-69] 60 (07/23 0510) Resp:  [18] 18 (07/23 0510) BP: (110-138)/(59-64) 125/62 (07/23 0510) SpO2:  [98 %-99 %] 99 % (07/23 0510) Last BM Date : 03/30/23  Intake/Output from previous day: 07/22 0701 - 07/23 0700 In: 1560 [P.O.:960; I.V.:600] Out: -  Intake/Output this shift: Total I/O In: 240 [P.O.:240] Out: -   General appearance: alert, cooperative, and no distress GI: soft, non-tender; bowel sounds normal; no masses,  no organomegaly  Lab Results:  Recent Labs    03/31/23 0356 04/01/23 0405  WBC 10.7* 8.6  HGB 14.5 14.9  HCT 41.0 42.3  PLT 202 197   BMET Recent Labs    03/31/23 0356 04/01/23 0405  NA 139 139  K 3.5 3.2*  CL 110 106  CO2 22 22  GLUCOSE 77 216*  BUN 12 13  CREATININE 0.78 0.89  CALCIUM 8.0* 8.5*   PT/INR No results for input(s): "LABPROT", "INR" in the last 72 hours.  Studies/Results: ECHOCARDIOGRAM COMPLETE  Result Date: 03/31/2023    ECHOCARDIOGRAM REPORT   Patient Name:   Andrew Oconnor Date of Exam: 03/31/2023 Medical Rec #:  [id number]        Height:       55.0 in Accession #:    [id number]       Weight:       148.0 lb Date of Birth:  Apr 12, 1956        BSA:          1.542 m Patient Age:    67 years         BP:           131/65 mmHg Patient Gender: M                HR:           69 bpm. [facility] Procedure: 2D Echo, Cardiac Doppler and Color Doppler Indications:    Z01.818 Encounter for other preprocedural examination  History:        Patient has prior history of Echocardiogram examinations, most                 recent 11/25/2014. Signs/Symptoms:Chest Pain and Dyspnea; Risk                 Factors:Diabetes and Dyslipidemia. ETOH.  Sonographer:    Sheralyn Boatman RDCS Referring Phys: [phone] [facility] L [facility]NS[facility] IMPRESSIONS  1. Left ventricular  ejection fraction, by estimation, is 65 to 70%. The left ventricle has normal function. The left ventricle has no regional wall motion abnormalities. Left ventricular diastolic parameters are consistent with Grade I diastolic dysfunction (impaired relaxation).  2. Right ventricular systolic function is normal. The right ventricular size is normal.  3. The mitral valve is grossly normal. Mild mitral valve regurgitation.  4. The aortic valve is tricuspid. Aortic valve regurgitation is not visualized. Aortic valve sclerosis is present, with no evidence of aortic valve stenosis.  5. The inferior vena cava is dilated in size with <50% respiratory variability, suggesting right atrial pressure of 15 mmHg. FINDINGS  Left Ventricle: Left ventricular ejection fraction, by estimation, is 65 to 70%. The left ventricle has normal function. The left ventricle has no regional wall motion abnormalities. The left ventricular internal cavity size was normal in size. There is  no left ventricular hypertrophy. Left ventricular diastolic parameters are consistent with Grade I diastolic dysfunction (impaired relaxation). Right Ventricle: The right ventricular size is normal. No increase in right ventricular wall thickness. Right ventricular systolic function is normal. Left Atrium: Left atrial size was normal in size. Right Atrium: Right atrial size was normal in size. Pericardium: There is no evidence of pericardial effusion. Mitral Valve: The mitral valve is grossly normal. Mild mitral valve regurgitation. Tricuspid Valve: The tricuspid valve is normal in structure. Tricuspid valve regurgitation is trivial. Aortic Valve: The aortic valve is tricuspid. Aortic valve regurgitation is not visualized. Aortic valve sclerosis is present, with no evidence of aortic valve stenosis. Pulmonic Valve: The pulmonic valve was grossly normal. Pulmonic valve regurgitation is not visualized. Aorta: The aortic root and ascending aorta are structurally  normal, with no evidence of dilitation. Venous: The inferior vena cava is dilated in size with less than 50% respiratory variability, suggesting right atrial pressure of 15 mmHg. IAS/Shunts: No atrial level shunt detected by color flow Doppler.  LEFT VENTRICLE PLAX 2D LVIDd:         4.10 cm     Diastology LVIDs:         2.60 cm     LV e' medial:    7.07 cm/s LV PW:         1.00 cm     LV E/e' medial:  16.7 LV IVS:        1.00 cm     LV e' lateral:   7.83 cm/s LVOT diam:     2.00 cm     LV E/e' lateral: 15.1 LV SV:         78 LV SV Index:   50 LVOT Area:     3.14 cm  LV Volumes (MOD) LV vol d, MOD A2C: 64.0 ml LV vol d, MOD A4C: 73.9 ml LV vol s, MOD A2C: 18.9 ml LV vol s, MOD A4C: 21.3 ml LV SV MOD A2C:     45.1 ml LV SV MOD A4C:     73.9 ml LV SV MOD BP:      49.5 ml RIGHT VENTRICLE             IVC RV S prime:     10.20 cm/s  IVC diam: 2.00 cm TAPSE (M-mode): 2.1 cm LEFT ATRIUM             Index        RIGHT ATRIUM           Index LA diam:        3.50 cm 2.27 cm/m   RA Area:     10.50 cm LA Vol (A2C):   30.1 ml 19.52 ml/m  RA Volume:   19.30 ml  12.52 ml/m LA Vol (A4C):   36.6 ml 23.74 ml/m LA Biplane Vol: 35.9 ml 23.28 ml/m  AORTIC VALVE LVOT Vmax:   116.00 cm/s LVOT Vmean:  75.000 cm/s LVOT VTI:    0.247 m  AORTA Ao Root diam: 2.90 cm Ao Asc diam:  3.70 cm MITRAL VALVE MV Area (PHT): 4.31 cm     SHUNTS MV Decel Time: 176 msec     Systemic VTI:  0.25 m MV E velocity: 118.00 cm/s  Systemic Diam: 2.00 cm MV A velocity: 113.00 cm/s MV E/A ratio:  1.04 Dietrich Pates MD Electronically signed by Dietrich Pates MD Signature Date/Time: 03/31/2023/4:24:09 PM    Final    US Carotid Bilateral  Result Date:  03/31/2023 CLINICAL DATA:  67 year old male with right carotid bruit EXAM: BILATERAL CAROTID DUPLEX ULTRASOUND TECHNIQUE: Wallace Cullens scale imaging, color Doppler and duplex ultrasound were performed of bilateral carotid and vertebral arteries in the neck. COMPARISON:  None Available. FINDINGS: Criteria: Quantification of  carotid stenosis is based on velocity parameters that correlate the residual internal carotid diameter with NASCET-based stenosis levels, using the diameter of the distal internal carotid lumen as the denominator for stenosis measurement. The following velocity measurements were obtained: RIGHT ICA:  Systolic 104 cm/sec, Diastolic 16 cm/sec CCA:  104 cm/sec SYSTOLIC ICA/CCA RATIO:  1.0 ECA:  175 cm/sec LEFT ICA:  Systolic 92 cm/sec, Diastolic 20 cm/sec CCA:  123 cm/sec SYSTOLIC ICA/CCA RATIO:  0.7 ECA:  159 cm/sec Right Brachial SBP: Not acquired Left Brachial SBP: Not acquired RIGHT CAROTID ARTERY: No significant calcified disease of the right common carotid artery. Intermediate waveform maintained. Homogeneous plaque without significant calcifications at the right carotid bifurcation. Low resistance waveform of the right ICA. No significant tortuosity. RIGHT VERTEBRAL ARTERY: Antegrade flow with low resistance waveform. LEFT CAROTID ARTERY: No significant calcified disease of the left common carotid artery. Intermediate waveform maintained. Homogeneous plaque at the left carotid bifurcation without significant calcifications. Low resistance waveform of the left ICA. LEFT VERTEBRAL ARTERY:  Antegrade flow with low resistance waveform. IMPRESSION: Color duplex indicates minimal homogeneous plaque, with no hemodynamically significant stenosis by duplex criteria in the extracranial cerebrovascular circulation. Signed, Yvone Neu. Miachel Roux, RPVI [facility] Electronically Signed   By: Gilmer Mor D.O.   On: 03/31/2023 14:01   US Abdomen Limited RUQ (LIVER/GB)  Result Date: 03/31/2023 CLINICAL DATA:  6194 Acute cholecystitis 6194 EXAM: ULTRASOUND ABDOMEN LIMITED RIGHT UPPER QUADRANT COMPARISON:  03/30/2023 FINDINGS: Gallbladder: Abnormal gallbladder wall thickening measuring up to 7 mm. Layering sludge and small stones within the gallbladder. No  sonographic Murphy sign noted by sonographer. Common bile duct: Diameter: 6 mm. Liver: 1.8 cm echogenic lesion within the subcapsular aspect of the left hepatic lobe compatible with hemangioma. No additional liver lesion. Mildly increased hepatic parenchymal echogenicity. Portal vein is patent on color Doppler imaging with normal direction of blood flow towards the liver. Other: None. IMPRESSION: 1. Cholelithiasis with abnormal gallbladder wall thickening, suspicious for cholecystitis. No sonographic Eulah Pont sign was reported on exam. Correlate for pain medication administration. 2. Mildly increased hepatic parenchymal echogenicity, which is nonspecific but can be seen in the setting of hepatic steatosis. 3. Solitary 1.8 cm echogenic lesion within the left hepatic lobe compatible with hemangioma. Electronically Signed   By: Duanne Guess D.O.   On: 03/31/2023 13:00   MR ABDOMEN WITH MRCP W CONTRAST  Result Date: 03/31/2023 CLINICAL DATA:  Upper abdominal pain. Cholelithiasis. Indeterminate liver lesion on recent CT. EXAM: MRI ABDOMEN WITH CONTRAST (WITH MRCP) TECHNIQUE: Multiplanar multisequence MR imaging of the abdomen was performed following the administration of intravenous contrast. Heavily T2-weighted images of the biliary and pancreatic ducts were obtained, and three-dimensional MRCP images were rendered by post processing. CONTRAST:  7mL GADAVIST GADOBUTROL 1 MMOL/ML IV SOLN COMPARISON:  CT on 03/30/2023 FINDINGS: Lower chest: No acute findings. Hepatobiliary: A 1.9 cm subcapsular flash-filling hemangioma is seen anterior left hepatic lobe, corresponding with the liver lesion seen on recent CT. No other hepatic masses are identified. Multiple tiny gallstones are seen. Respiratory motion artifact causes image degradation. Gallbladder is mildly distended and shows mild diffuse wall thickening and contrast enhancement, suspicious for acute cholecystitis. No evidence of biliary ductal dilatation.  Pancreas:   No mass or inflammatory changes. Spleen:  Within normal limits in size and appearance. Adrenals/Urinary Tract: No suspicious masses identified. No evidence of hydronephrosis. Stomach/Bowel: Unremarkable. Vascular/Lymphatic: No pathologically enlarged lymph nodes identified. No acute vascular findings. Other:  None. Musculoskeletal:  No suspicious bone lesions identified. IMPRESSION: Cholelithiasis, with findings suspicious for acute cholecystitis. No evidence of biliary ductal dilatation. 1.9 cm benign flash-filling hemangioma in anterior left hepatic lobe, corresponding with the liver lesion seen on recent CT. Electronically Signed   By: Danae Orleans M.D.   On: 03/31/2023 11:47   MR 3D Recon At Scanner  Result Date: 03/31/2023 CLINICAL DATA:  Upper abdominal pain. Cholelithiasis. Indeterminate liver lesion on recent CT. EXAM: MRI ABDOMEN WITH CONTRAST (WITH MRCP) TECHNIQUE: Multiplanar multisequence MR imaging of the abdomen was performed following the administration of intravenous contrast. Heavily T2-weighted images of the biliary and pancreatic ducts were obtained, and three-dimensional MRCP images were rendered by post processing. CONTRAST:  7mL GADAVIST GADOBUTROL 1 MMOL/ML IV SOLN COMPARISON:  CT on 03/30/2023 FINDINGS: Lower chest: No acute findings. Hepatobiliary: A 1.9 cm subcapsular flash-filling hemangioma is seen anterior left hepatic lobe, corresponding with the liver lesion seen on recent CT. No other hepatic masses are identified. Multiple tiny gallstones are seen. Respiratory motion artifact causes image degradation. Gallbladder is mildly distended and shows mild diffuse wall thickening and contrast enhancement, suspicious for acute cholecystitis. No evidence of biliary ductal dilatation. Pancreas:  No mass or inflammatory changes. Spleen:  Within normal limits in size and appearance. Adrenals/Urinary Tract: No suspicious masses identified. No evidence of hydronephrosis. Stomach/Bowel:  Unremarkable. Vascular/Lymphatic: No pathologically enlarged lymph nodes identified. No acute vascular findings. Other:  None. Musculoskeletal:  No suspicious bone lesions identified. IMPRESSION: Cholelithiasis, with findings suspicious for acute cholecystitis. No evidence of biliary ductal dilatation. 1.9 cm benign flash-filling hemangioma in anterior left hepatic lobe, corresponding with the liver lesion seen on recent CT. Electronically Signed   By: Danae Orleans M.D.   On: 03/31/2023 11:47   CT ABDOMEN PELVIS W CONTRAST  Result Date: 03/30/2023 CLINICAL DATA:  Epigastric pain. EXAM: CT ABDOMEN AND PELVIS WITH CONTRAST TECHNIQUE: Multidetector CT imaging of the abdomen and pelvis was performed using the standard protocol following bolus administration of intravenous contrast. RADIATION DOSE REDUCTION: This exam was performed according to the departmental dose-optimization program which includes automated exposure control, adjustment of the mA and/or kV according to patient size and/or use of iterative reconstruction technique. CONTRAST:  OMNIPAQUE IOHEXOL 300 MG/ML  SOLN COMPARISON:  None Available. FINDINGS: Lower chest: The visualized lung bases are clear. No intra-abdominal free air or free fluid. Hepatobiliary: There is a 1.3 cm hypoenhancing lesion in the left lobe of the liver which is not characterized on this CT but likely represents a flash filling hemangioma or a portal venous shunting. This likely corresponds to the hypoechoic lesion seen on the ultrasound of 04/21/2013. Further characterization with MRI without and with contrast on a nonemergent/outpatient basis recommended. No biliary dilatation. There multiple stones within the gallbladder. There is mild haziness of the gallbladder wall with trace pericholecystic fluid concerning for acute cholecystitis. Further evaluation with ultrasound is recommended. Pancreas: Unremarkable. No pancreatic ductal dilatation or surrounding inflammatory  changes. Spleen: Normal in size without focal abnormality. Adrenals/Urinary Tract: The adrenal glands are unremarkable. The kidneys, visualized ureters, and urinary bladder appear unremarkable. Stomach/Bowel: There is scattered colonic diverticula without active inflammatory changes. There is enhancement of the gastric mucosa, likely physiologic. Gastritis is less likely. Clinical correlation  is recommended. There is no bowel obstruction. The appendix is normal. Vascular/Lymphatic: Mild aortoiliac atherosclerotic disease. The IVC is unremarkable. No portal venous gas. There is no adenopathy. Reproductive: The prostate and seminal vesicles are grossly unremarkable. No pelvic mass. Other: Small fat containing bilateral inguinal hernias. Musculoskeletal: Degenerative changes of the spine. No acute osseous pathology. IMPRESSION: 1. Cholelithiasis with findings concerning for acute cholecystitis. Further evaluation with ultrasound is recommended. 2. Colonic diverticulosis. No bowel obstruction. Normal appendix. 3. A 1.3 cm hypoenhancing lesion in the left lobe of the liver, likely a flash filling hemangioma or a portal venous shunting. Further characterization with MRI without and with contrast on a nonemergent/outpatient basis recommended. 4.  Aortic Atherosclerosis (ICD10-I70.0). Electronically Signed   By: Elgie Collard M.D.   On: 03/30/2023 21:47    Anti-infectives: Anti-infectives (From admission, onward)    None       Assessment/Plan: Impression: Biliary colic secondary to cholelithiasis.  MRCP negative for choledocholithiasis.  Duplex ultrasound of carotids unremarkable.  2D echo unremarkable.  Appreciate cardiac consultation for clearance.  Will proceed with robotic assisted laparoscopic cholecystectomy tomorrow.  The risks and benefits of the procedure including bleeding, infection, hepatobiliary injury, and the possibility of an open procedure were fully explained to the patient, who gave informed  consent.  LOS: 0 days    Franky Macho 04/01/2023

## 2023-04-02 ENCOUNTER — Other Ambulatory Visit: Payer: Self-pay

## 2023-04-02 ENCOUNTER — Observation Stay (HOSPITAL_COMMUNITY): Payer: Medicare PPO | Admitting: Certified Registered"

## 2023-04-02 ENCOUNTER — Encounter (HOSPITAL_COMMUNITY)
Admission: EM | Disposition: A | Discharge status: Home / Self Care | Payer: Self-pay | Source: Home / Self Care | Attending: Emergency Medicine

## 2023-04-02 ENCOUNTER — Encounter (HOSPITAL_COMMUNITY): Payer: Self-pay | Admitting: Family Medicine

## 2023-04-02 ENCOUNTER — Observation Stay (HOSPITAL_BASED_OUTPATIENT_CLINIC_OR_DEPARTMENT_OTHER): Payer: Medicare PPO | Admitting: Certified Registered"

## 2023-04-02 DIAGNOSIS — I110 Hypertensive heart disease with heart failure: Secondary | ICD-10-CM | POA: Diagnosis not present

## 2023-04-02 DIAGNOSIS — K810 Acute cholecystitis: Secondary | ICD-10-CM | POA: Diagnosis not present

## 2023-04-02 DIAGNOSIS — K8000 Calculus of gallbladder with acute cholecystitis without obstruction: Secondary | ICD-10-CM

## 2023-04-02 DIAGNOSIS — Z87891 Personal history of nicotine dependence: Secondary | ICD-10-CM | POA: Diagnosis not present

## 2023-04-02 DIAGNOSIS — I5032 Chronic diastolic (congestive) heart failure: Secondary | ICD-10-CM | POA: Diagnosis not present

## 2023-04-02 LAB — SURGICAL PCR SCREEN
MRSA, PCR: NEGATIVE
Staphylococcus aureus: NEGATIVE

## 2023-04-02 LAB — GLUCOSE, CAPILLARY
Glucose-Capillary: 171 mg/dL — ABNORMAL HIGH (ref 70–99)
Glucose-Capillary: 208 mg/dL — ABNORMAL HIGH (ref 70–99)
Glucose-Capillary: 209 mg/dL — ABNORMAL HIGH (ref 70–99)
Glucose-Capillary: 230 mg/dL — ABNORMAL HIGH (ref 70–99)
Glucose-Capillary: 230 mg/dL — ABNORMAL HIGH (ref 70–99)
Glucose-Capillary: 272 mg/dL — ABNORMAL HIGH (ref 70–99)
Glucose-Capillary: 294 mg/dL — ABNORMAL HIGH (ref 70–99)
Glucose-Capillary: 70 mg/dL (ref 70–99)
Glucose-Capillary: 97 mg/dL (ref 70–99)

## 2023-04-02 LAB — BASIC METABOLIC PANEL
Anion gap: 8 (ref 5–15)
BUN: 16 mg/dL (ref 8–23)
CO2: 24 mmol/L (ref 22–32)
Calcium: 8.5 mg/dL — ABNORMAL LOW (ref 8.9–10.3)
Chloride: 106 mmol/L (ref 98–111)
Creatinine, Ser: 0.81 mg/dL (ref 0.61–1.24)
GFR, Estimated: >60 mL/min (ref >60)
Glucose, Bld: 133 mg/dL — ABNORMAL HIGH (ref 70–99)
Potassium: 3.9 mmol/L (ref 3.5–5.1)
Sodium: 138 mmol/L (ref 135–145)

## 2023-04-02 SURGERY — CHOLECYSTECTOMY, ROBOT-ASSISTED, LAPAROSCOPIC
Anesthesia: General | Site: Abdomen

## 2023-04-02 MED ORDER — FENTANYL CITRATE PF 50 MCG/ML IJ SOSY
PREFILLED_SYRINGE | INTRAMUSCULAR | Status: AC
  Filled 2023-04-02: qty 1

## 2023-04-02 MED ORDER — SODIUM CHLORIDE 0.9 % IR SOLN
Status: DC | PRN
  Administered 2023-04-02: 3000 mL

## 2023-04-02 MED ORDER — INDOCYANINE GREEN 25 MG IV SOLR
INTRAVENOUS | Status: AC
  Administered 2023-04-02: 2.5 mg via INTRAVENOUS
  Filled 2023-04-02: qty 10

## 2023-04-02 MED ORDER — CHLORHEXIDINE GLUCONATE CLOTH 2 % EX PADS: 6.0000 | MEDICATED_PAD | Freq: Once | CUTANEOUS | Status: AC

## 2023-04-02 MED ORDER — LABETALOL HCL 5 MG/ML IV SOLN
INTRAVENOUS | Status: AC
  Filled 2023-04-02: qty 4

## 2023-04-02 MED ORDER — CHLORHEXIDINE GLUCONATE 0.12 % MT SOLN
OROMUCOSAL | Status: AC
  Administered 2023-04-02: 15 mL via OROMUCOSAL
  Filled 2023-04-02: qty 15

## 2023-04-02 MED ORDER — LABETALOL HCL 5 MG/ML IV SOLN
5.0000 mg | Freq: Once | INTRAVENOUS | Status: AC
  Administered 2023-04-02: 5 mg via INTRAVENOUS

## 2023-04-02 MED ORDER — INSULIN ASPART 100 UNIT/ML IJ SOLN
0.0000 [IU] | INTRAMUSCULAR | Status: DC
  Administered 2023-04-02 (×2): 5 [IU] via SUBCUTANEOUS
  Administered 2023-04-02 – 2023-04-03 (×2): 8 [IU] via SUBCUTANEOUS
  Administered 2023-04-03: 5 [IU] via SUBCUTANEOUS

## 2023-04-02 MED ORDER — PROPOFOL 10 MG/ML IV BOLUS
INTRAVENOUS | Status: AC
  Filled 2023-04-02: qty 20

## 2023-04-02 MED ORDER — DEXTROSE-SODIUM CHLORIDE 5-0.9 % IV SOLN: INTRAVENOUS | Status: DC

## 2023-04-02 MED ORDER — FENTANYL CITRATE (PF) 250 MCG/5ML IJ SOLN
INTRAMUSCULAR | Status: AC
  Filled 2023-04-02: qty 5

## 2023-04-02 MED ORDER — CEFAZOLIN SODIUM-DEXTROSE 2-4 GM/100ML-% IV SOLN
2.0000 g | INTRAVENOUS | Status: AC
  Administered 2023-04-02: 2 g via INTRAVENOUS

## 2023-04-02 MED ORDER — ENOXAPARIN SODIUM 40 MG/0.4ML IJ SOSY
40.0000 mg | PREFILLED_SYRINGE | INTRAMUSCULAR | Status: DC
  Administered 2023-04-03: 40 mg via SUBCUTANEOUS
  Filled 2023-04-02: qty 0.4

## 2023-04-02 MED ORDER — CHLORHEXIDINE GLUCONATE CLOTH 2 % EX PADS
6.0000 | MEDICATED_PAD | Freq: Once | CUTANEOUS | Status: AC
  Administered 2023-04-02: 6 via TOPICAL

## 2023-04-02 MED ORDER — HYDROCODONE-ACETAMINOPHEN 5-325 MG PO TABS: 1.0000 | ORAL_TABLET | ORAL | 0 refills | Status: DC | PRN

## 2023-04-02 MED ORDER — ROCURONIUM BROMIDE 10 MG/ML (PF) SYRINGE
PREFILLED_SYRINGE | INTRAVENOUS | Status: DC | PRN
  Administered 2023-04-02: 50 mg via INTRAVENOUS
  Administered 2023-04-02: 5 mg via INTRAVENOUS

## 2023-04-02 MED ORDER — SIMETHICONE 80 MG PO CHEW
40.0000 mg | CHEWABLE_TABLET | Freq: Four times a day (QID) | ORAL | Status: DC | PRN
  Administered 2023-04-02 (×2): 40 mg via ORAL
  Filled 2023-04-02 (×2): qty 1

## 2023-04-02 MED ORDER — INDOCYANINE GREEN 25 MG IV SOLR: 2.5000 mg | Freq: Once | INTRAVENOUS | Status: AC

## 2023-04-02 MED ORDER — SUGAMMADEX SODIUM 200 MG/2ML IV SOLN
INTRAVENOUS | Status: DC | PRN
  Administered 2023-04-02: 200 mg via INTRAVENOUS

## 2023-04-02 MED ORDER — OXYCODONE HCL 5 MG/5ML PO SOLN: 5.0000 mg | Freq: Once | ORAL | Status: DC | PRN

## 2023-04-02 MED ORDER — DEXAMETHASONE SODIUM PHOSPHATE 10 MG/ML IJ SOLN
INTRAMUSCULAR | Status: DC | PRN
  Administered 2023-04-02: 5 mg via INTRAVENOUS

## 2023-04-02 MED ORDER — STERILE WATER FOR IRRIGATION IR SOLN
Status: DC | PRN
  Administered 2023-04-02: 500 mL

## 2023-04-02 MED ORDER — MIDAZOLAM HCL 2 MG/2ML IJ SOLN
INTRAMUSCULAR | Status: DC | PRN
  Administered 2023-04-02: 1 mg via INTRAVENOUS

## 2023-04-02 MED ORDER — OXYCODONE HCL 5 MG PO TABS: 5.0000 mg | ORAL_TABLET | Freq: Once | ORAL | Status: DC | PRN

## 2023-04-02 MED ORDER — ONDANSETRON HCL 4 MG/2ML IJ SOLN: 4.0000 mg | Freq: Once | INTRAMUSCULAR | Status: DC | PRN

## 2023-04-02 MED ORDER — FENTANYL CITRATE (PF) 100 MCG/2ML IJ SOLN
INTRAMUSCULAR | Status: DC | PRN
  Administered 2023-04-02 (×5): 50 ug via INTRAVENOUS

## 2023-04-02 MED ORDER — MIDAZOLAM HCL 2 MG/2ML IJ SOLN
INTRAMUSCULAR | Status: AC
  Filled 2023-04-02: qty 2

## 2023-04-02 MED ORDER — ONDANSETRON HCL 4 MG/2ML IJ SOLN
INTRAMUSCULAR | Status: DC | PRN
Start: 2023-04-02 — End: 2023-04-02
  Administered 2023-04-02: 4 mg via INTRAVENOUS

## 2023-04-02 MED ORDER — PROPOFOL 10 MG/ML IV BOLUS
INTRAVENOUS | Status: DC | PRN
  Administered 2023-04-02: 200 mg via INTRAVENOUS

## 2023-04-02 MED ORDER — CHLORHEXIDINE GLUCONATE 0.12 % MT SOLN: 15.0000 mL | Freq: Once | OROMUCOSAL | Status: AC

## 2023-04-02 MED ORDER — LACTATED RINGERS IV SOLN: INTRAVENOUS | Status: DC

## 2023-04-02 MED ORDER — HYDRALAZINE HCL 20 MG/ML IJ SOLN
10.0000 mg | INTRAMUSCULAR | Status: DC | PRN
  Administered 2023-04-02: 10 mg via INTRAVENOUS
  Filled 2023-04-02: qty 1

## 2023-04-02 MED ORDER — CEFAZOLIN SODIUM-DEXTROSE 2-4 GM/100ML-% IV SOLN
INTRAVENOUS | Status: AC
  Filled 2023-04-02: qty 100

## 2023-04-02 MED ORDER — BUPIVACAINE HCL (PF) 0.5 % IJ SOLN
INTRAMUSCULAR | Status: DC | PRN
  Administered 2023-04-02: 30 mL

## 2023-04-02 MED ORDER — BUPIVACAINE HCL (PF) 0.5 % IJ SOLN
INTRAMUSCULAR | Status: AC
  Filled 2023-04-02: qty 30

## 2023-04-02 MED ORDER — FENTANYL CITRATE PF 50 MCG/ML IJ SOSY
25.0000 ug | PREFILLED_SYRINGE | INTRAMUSCULAR | Status: DC | PRN
  Administered 2023-04-02: 50 ug via INTRAVENOUS

## 2023-04-02 MED ORDER — LIDOCAINE HCL (CARDIAC) PF 100 MG/5ML IV SOSY
PREFILLED_SYRINGE | INTRAVENOUS | Status: DC | PRN
  Administered 2023-04-02: 60 mg via INTRATRACHEAL

## 2023-04-02 SURGICAL SUPPLY — 40 items
ADH SKN CLS APL DERMABOND .7 (GAUZE/BANDAGES/DRESSINGS) ×1
APL PRP STRL LF DISP 70% ISPRP (MISCELLANEOUS) ×1
CAUTERY HOOK MNPLR 1.6 DVNC XI (INSTRUMENTS) ×1 IMPLANT
CHLORAPREP W/TINT 26 (MISCELLANEOUS) ×1 IMPLANT
CLIP LIGATING HEM O LOK PURPLE (MISCELLANEOUS) ×1 IMPLANT
DA VINCI INSUFFLATOR TUBE SET (TUBING) ×1
DERMABOND ADVANCED .7 DNX12 (GAUZE/BANDAGES/DRESSINGS) ×1 IMPLANT
DRAPE ARM DVNC X/XI (DISPOSABLE) ×4 IMPLANT
DRAPE COLUMN DVNC XI (DISPOSABLE) ×1 IMPLANT
ELECT REM PT RETURN 9FT ADLT (ELECTROSURGICAL) ×1
ELECTRODE REM PT RTRN 9FT ADLT (ELECTROSURGICAL) ×1 IMPLANT
FORCEPS BPLR R/ABLATION 8 DVNC (INSTRUMENTS) ×1 IMPLANT
FORCEPS PROGRASP DVNC XI (FORCEP) ×1 IMPLANT
GLOVE BIOGEL PI IND STRL 7.0 (GLOVE) ×2 IMPLANT
GLOVE SURG SS PI 7.5 STRL IVOR (GLOVE) ×2 IMPLANT
GOWN STRL REUS W/TWL LRG LVL3 (GOWN DISPOSABLE) ×3 IMPLANT
GRASPER SUT TROCAR 14GX15 (MISCELLANEOUS) IMPLANT
IRRIGATOR SUCT 8 DISP DVNC XI (IRRIGATION / IRRIGATOR) IMPLANT
IV NS IRRIG 3000ML ARTHROMATIC (IV SOLUTION) IMPLANT
KIT TURNOVER KIT A (KITS) ×1 IMPLANT
MANIFOLD NEPTUNE II (INSTRUMENTS) ×1 IMPLANT
NDL HYPO 21X1.5 SAFETY (NEEDLE) ×1 IMPLANT
NDL INSUFFLATION 14GA 120MM (NEEDLE) ×1 IMPLANT
NEEDLE HYPO 21X1.5 SAFETY (NEEDLE) ×1 IMPLANT
NEEDLE INSUFFLATION 14GA 120MM (NEEDLE) ×1 IMPLANT
OBTURATOR OPTICAL STND 8 DVNC (TROCAR) ×1
OBTURATOR OPTICALSTD 8 DVNC (TROCAR) ×1 IMPLANT
PACK LAP CHOLE LZT030E (CUSTOM PROCEDURE TRAY) ×1 IMPLANT
PAD ARMBOARD 7.5X6 YLW CONV (MISCELLANEOUS) ×1 IMPLANT
PENCIL HANDSWITCHING (ELECTRODE) ×1 IMPLANT
POSITIONER HEAD 8X9X4 ADT (SOFTGOODS) ×1 IMPLANT
SEAL CANN UNIV 5-8 DVNC XI (MISCELLANEOUS) ×4 IMPLANT
SET TUBE DA VINCI INSUFFLATOR (TUBING) IMPLANT
SPIKE FLUID TRANSFER (MISCELLANEOUS) ×1 IMPLANT
SUT MNCRL AB 4-0 PS2 18 (SUTURE) ×2 IMPLANT
SUT VICRYL 0 AB UR-6 (SUTURE) ×1 IMPLANT
SYR 30ML LL (SYRINGE) ×1 IMPLANT
SYS RETRIEVAL 5MM INZII UNIV (BASKET) ×1
SYSTEM RETRIEVL 5MM INZII UNIV (BASKET) ×1 IMPLANT
WATER STERILE IRR 500ML POUR (IV SOLUTION) ×1 IMPLANT

## 2023-04-02 NOTE — Transfer of Care (Signed)
Immediate Anesthesia Transfer of Care Note  Patient: Andrew Oconnor  Procedure(s) Performed: XI ROBOTIC ASSISTED LAPAROSCOPIC CHOLECYSTECTOMY (Abdomen)  Patient Location: PACU  Anesthesia Type:General  Level of Consciousness: awake  Airway & Oxygen Therapy: Patient Spontanous Breathing and Patient connected to nasal cannula oxygen  Post-op Assessment: Report given to RN and Post -op Vital signs reviewed and stable  Post vital signs: Reviewed and stable  Last Vitals:  Vitals Value Taken Time  BP 158/93 04/02/23 1530  Temp 97.3   Pulse 61 04/02/23 1530  Resp 10 04/02/23 1530  SpO2 100 % 04/02/23 1530  Vitals shown include unfiled device data.  Last Pain:  Vitals:   04/02/23 1235  TempSrc:   PainSc: 0-No pain         Complications: No notable events documented.

## 2023-04-02 NOTE — Progress Notes (Signed)
PROGRESS NOTE    Andrew Oconnor  NFA:[id number] DOB: 12-28-1955 DOA: 03/30/2023 PCP: [facility]   Brief Narrative:    67 y.o. male with medical history significant of CHF, diabetes mellitus type 2, GERD, hyperlipidemia, restless leg syndrome, hypertension, thyroid disorder, and more presents to the ED with a chief complaint of achy right upper quadrant pain.  He was admitted for acute cholecystitis and has plans for cholecystectomy today.  Assessment & Plan:   Principal Problem:   Acute cholecystitis Active Problems:   Diabetes mellitus type 2, controlled (HCC)   Hypothyroidism   Hyperlipidemia   Hypertension   Restless leg   Hypokalemia  Assessment and Plan:  Acute cholecystitis - CT abdomen pelvis shows cholecystitis with hypoenhancing lesion of left lobe of liver - Jenkins consulted and recommending ultrasound and MRCP no choledocholithiasis found - surgery team requested cardiology preoperative evaluation, pt now cleared for surgery - discussed with surgery, planning OR robotic lap chole 7/24    Chronic Restless leg syndrome  - Patient reports that he failed many outpatient medications for restless leg and he ended up on levodopa/carbidopa for this concern - Continue levodopa/carbidopa   Hypertension - Continue home benazepril, metoprolol   Hyperlipidemia - Continue pravastatin   Hypothyroidism - Continue Synthroid   Diabetes mellitus type 2, tightly controlled (A1c 5.8%) - He requested to use his insulin pump in hospital, I explained to him he may need to remove it temporarily for surgery - insulin pump order set initiated and consult to DM coordinator to work with him while on pump for safety - Pt has a CGM on his left arm that communicates with pump and alerts with hypoglycemia   Hypoglycemia from insulin-resolved -- suspect he overcorrected yesterday from the hyperglycemia he had when pump was disconnected; he has a CGM on  his left arm, he is monitoring his CBG constantly now; I have asked him to try to tolerate CBG 140-180 while in hospital and no need to tightly correct his BS now    DVT prophylaxis: SCDs Code Status: Full  Family Communication: None at bedside Disposition: anticipate home    Consultants:  Surgery Cardiology for preop clearance  DM coordinator  Procedures:    Antimicrobials:  None   Subjective: Patient seen and evaluated today with no new acute complaints or concerns. No acute concerns or events noted overnight.  He is anticipating surgery this afternoon.  Objective: Vitals:   04/01/23 0510 04/01/23 1410 04/01/23 2123 04/02/23 0504  BP: 125/62 (!) 121/59 134/65 (!) 129/55  Pulse: 60 (!) 58 60 61  Resp: 18 20 16 18   Temp: 97.9 F (36.6 C) 98 F (36.7 C) 98.3 F (36.8 C) 97.8 F (36.6 C)  TempSrc: Oral Oral Oral Oral  SpO2: 99% 97% 97% 99%  Weight:      Height:        Intake/Output Summary (Last 24 hours) at 04/02/2023 0939 Last data filed at 04/02/2023 0818 Gross per 24 hour  Intake 480 ml  Output --  Net 480 ml   Filed Weights   03/30/23 1935  Weight: 67.1 kg    Examination:  General exam: Appears calm and comfortable  Respiratory system: Clear to auscultation. Respiratory effort normal. Cardiovascular system: S1 & S2 heard, RRR.  Gastrointestinal system: Abdomen is soft Central nervous system: Alert and awake Extremities: No edema Skin: No significant lesions noted Psychiatry: Flat affect.    Data Reviewed: I have personally reviewed following labs and imaging studies  CBC: Recent Labs  Lab 03/30/23 1940 03/31/23 0356 04/01/23 0405  WBC 9.9 10.7* 8.6  NEUTROABS  --  7.1  --   HGB 17.0 14.5 14.9  HCT 49.1 41.0 42.3  MCV 89.9 88.9 89.4  PLT 242 202 197   Basic Metabolic Panel: Recent Labs  Lab 03/30/23 1940 03/31/23 0356 04/01/23 0405 04/02/23 0351  NA 139 139 139 138  K 3.4* 3.5 3.2* 3.9  CL 105 110 106 106  CO2 27 22 22 24    GLUCOSE 92 77 216* 133*  BUN 11 12 13 16   CREATININE 0.88 0.78 0.89 0.81  CALCIUM 8.6* 8.0* 8.5* 8.5*  MG  --  1.8 2.0  --    GFR: Estimated Creatinine Clearance: 62.5 mL/min (by C-G formula based on SCr of 0.81 mg/dL). Liver Function Tests: Recent Labs  Lab 03/30/23 1940 03/31/23 0356 04/01/23 0405  AST 52* 33 21  ALT 36 37 32  ALKPHOS 118 101 101  BILITOT 2.5* 2.0* 2.6*  PROT 7.5 6.1* 6.4*  ALBUMIN 4.0 3.2* 3.4*   Recent Labs  Lab 03/30/23 1940  LIPASE 23   No results for input(s): "AMMONIA" in the last 168 hours. Coagulation Profile: No results for input(s): "INR", "PROTIME" in the last 168 hours. Cardiac Enzymes: No results for input(s): "CKTOTAL", "CKMB", "CKMBINDEX", "TROPONINI" in the last 168 hours. BNP (last 3 results) No results for input(s): "PROBNP" in the last 8760 hours. HbA1C: Recent Labs    03/31/23 0356  HGBA1C 5.1   CBG: Recent Labs  Lab 04/01/23 1605 04/01/23 2125 04/02/23 0223 04/02/23 0730 04/02/23 0904  GLUCAP 101* 122* 171* 70 97   Lipid Profile: No results for input(s): "CHOL", "HDL", "LDLCALC", "TRIG", "CHOLHDL", "LDLDIRECT" in the last 72 hours. Thyroid Function Tests: No results for input(s): "TSH", "T4TOTAL", "FREET4", "T3FREE", "THYROIDAB" in the last 72 hours. Anemia Panel: No results for input(s): "VITAMINB12", "FOLATE", "FERRITIN", "TIBC", "IRON", "RETICCTPCT" in the last 72 hours. Sepsis Labs: No results for input(s): "PROCALCITON", "LATICACIDVEN" in the last 168 hours.  Recent Results (from the past 240 hour(s))  Surgical pcr screen     Status: None   Collection Time: 04/02/23  2:31 AM   Specimen: Nasal Mucosa; Nasal Swab  Result Value Ref Range Status   MRSA, PCR NEGATIVE NEGATIVE Final   Staphylococcus aureus NEGATIVE NEGATIVE Final    Comment: (NOTE) The Xpert SA Assay (FDA approved for NASAL specimens in patients 11 years of age and older), is one component of a comprehensive surveillance program. It is not  intended to diagnose infection nor to guide or monitor treatment. Performed at [facility], [address] [place], [place] [address]          Radiology Studies: ECHOCARDIOGRAM COMPLETE  Result Date: 03/31/2023    ECHOCARDIOGRAM REPORT   Patient Name:   ELHADJI PECORE Date of Exam: 03/31/2023 Medical Rec #:  [id number]        Height:       55.0 in Accession #:    [id number]       Weight:       148.0 lb Date of Birth:  Oct 14, 1955        BSA:          1.542 m Patient Age:    66 years         BP:           131/65 mmHg Patient Gender: M  HR:           69 bpm. [facility] Procedure: 2D Echo, Cardiac Doppler and Color Doppler Indications:    Z01.818 Encounter for other preprocedural examination  History:        Patient has prior history of Echocardiogram examinations, most                 recent 11/25/2014. Signs/Symptoms:Chest Pain and Dyspnea; Risk                 Factors:Diabetes and Dyslipidemia. ETOH.  Sonographer:    Sheralyn Boatman RDCS Referring Phys: [phone] [facility] IMPRESSIONS  1. Left ventricular ejection fraction, by estimation, is 65 to 70%. The left ventricle has normal function. The left ventricle has no regional wall motion abnormalities. Left ventricular diastolic parameters are consistent with Grade I diastolic dysfunction (impaired relaxation).  2. Right ventricular systolic function is normal. The right ventricular size is normal.  3. The mitral valve is grossly normal. Mild mitral valve regurgitation.  4. The aortic valve is tricuspid. Aortic valve regurgitation is not visualized. Aortic valve sclerosis is present, with no evidence of aortic valve stenosis.  5. The inferior vena cava is dilated in size with <50% respiratory variability, suggesting right atrial pressure of 15 mmHg. FINDINGS  Left Ventricle: Left ventricular ejection fraction, by estimation, is 65 to 70%. The left ventricle has normal function. The left ventricle has no regional wall motion  abnormalities. The left ventricular internal cavity size was normal in size. There is  no left ventricular hypertrophy. Left ventricular diastolic parameters are consistent with Grade I diastolic dysfunction (impaired relaxation). Right Ventricle: The right ventricular size is normal. No increase in right ventricular wall thickness. Right ventricular systolic function is normal. Left Atrium: Left atrial size was normal in size. Right Atrium: Right atrial size was normal in size. Pericardium: There is no evidence of pericardial effusion. Mitral Valve: The mitral valve is grossly normal. Mild mitral valve regurgitation. Tricuspid Valve: The tricuspid valve is normal in structure. Tricuspid valve regurgitation is trivial. Aortic Valve: The aortic valve is tricuspid. Aortic valve regurgitation is not visualized. Aortic valve sclerosis is present, with no evidence of aortic valve stenosis. Pulmonic Valve: The pulmonic valve was grossly normal. Pulmonic valve regurgitation is not visualized. Aorta: The aortic root and ascending aorta are structurally normal, with no evidence of dilitation. Venous: The inferior vena cava is dilated in size with less than 50% respiratory variability, suggesting right atrial pressure of 15 mmHg. IAS/Shunts: No atrial level shunt detected by color flow Doppler.  LEFT VENTRICLE PLAX 2D LVIDd:         4.10 cm     Diastology LVIDs:         2.60 cm     LV e' medial:    7.07 cm/s LV PW:         1.00 cm     LV E/e' medial:  16.7 LV IVS:        1.00 cm     LV e' lateral:   7.83 cm/s LVOT diam:     2.00 cm     LV E/e' lateral: 15.1 LV SV:         78 LV SV Index:   50 LVOT Area:     3.14 cm  LV Volumes (MOD) LV vol d, MOD A2C: 64.0 ml LV vol d, MOD A4C: 73.9 ml LV vol s, MOD A2C: 18.9 ml LV vol s, MOD A4C:  21.3 ml LV SV MOD A2C:     45.1 ml LV SV MOD A4C:     73.9 ml LV SV MOD BP:      49.5 ml RIGHT VENTRICLE             IVC RV S prime:     10.20 cm/s  IVC diam: 2.00 cm TAPSE (M-mode): 2.1 cm LEFT  ATRIUM             Index        RIGHT ATRIUM           Index LA diam:        3.50 cm 2.27 cm/m   RA Area:     10.50 cm LA Vol (A2C):   30.1 ml 19.52 ml/m  RA Volume:   19.30 ml  12.52 ml/m LA Vol (A4C):   36.6 ml 23.74 ml/m LA Biplane Vol: 35.9 ml 23.28 ml/m  AORTIC VALVE LVOT Vmax:   116.00 cm/s LVOT Vmean:  75.000 cm/s LVOT VTI:    0.247 m  AORTA Ao Root diam: 2.90 cm Ao Asc diam:  3.70 cm MITRAL VALVE MV Area (PHT): 4.31 cm     SHUNTS MV Decel Time: 176 msec     Systemic VTI:  0.25 m MV E velocity: 118.00 cm/s  Systemic Diam: 2.00 cm MV A velocity: 113.00 cm/s MV E/A ratio:  1.04 Dietrich Pates MD Electronically signed by Dietrich Pates MD Signature Date/Time: 03/31/2023/4:24:09 PM    Final    US Carotid Bilateral  Result Date: 03/31/2023 CLINICAL DATA:  67 year old male with right carotid bruit EXAM: BILATERAL CAROTID DUPLEX ULTRASOUND TECHNIQUE: Wallace Cullens scale imaging, color Doppler and duplex ultrasound were performed of bilateral carotid and vertebral arteries in the neck. COMPARISON:  None Available. FINDINGS: Criteria: Quantification of carotid stenosis is based on velocity parameters that correlate the residual internal carotid diameter with NASCET-based stenosis levels, using the diameter of the distal internal carotid lumen as the denominator for stenosis measurement. The following velocity measurements were obtained: RIGHT ICA:  Systolic 104 cm/sec, Diastolic 16 cm/sec CCA:  104 cm/sec SYSTOLIC ICA/CCA RATIO:  1.0 ECA:  175 cm/sec LEFT ICA:  Systolic 92 cm/sec, Diastolic 20 cm/sec CCA:  123 cm/sec SYSTOLIC ICA/CCA RATIO:  0.7 ECA:  159 cm/sec Right Brachial SBP: Not acquired Left Brachial SBP: Not acquired RIGHT CAROTID ARTERY: No significant calcified disease of the right common carotid artery. Intermediate waveform maintained. Homogeneous plaque without significant calcifications at the right carotid bifurcation. Low resistance waveform of the right ICA. No significant tortuosity. RIGHT VERTEBRAL  ARTERY: Antegrade flow with low resistance waveform. LEFT CAROTID ARTERY: No significant calcified disease of the left common carotid artery. Intermediate waveform maintained. Homogeneous plaque at the left carotid bifurcation without significant calcifications. Low resistance waveform of the left ICA. LEFT VERTEBRAL ARTERY:  Antegrade flow with low resistance waveform. IMPRESSION: Color duplex indicates minimal homogeneous plaque, with no hemodynamically significant stenosis by duplex criteria in the extracranial cerebrovascular circulation. Signed, Yvone Neu. Miachel Roux, RPVI [facility] Electronically Signed   By: Gilmer Mor D.O.   On: 03/31/2023 14:01        Scheduled Meds:  benazepril  5 mg Oral Daily   Carbidopa-Levodopa ER  1 tablet Oral BID   insulin pump   Subcutaneous TID WC, HS, 0200   levothyroxine  125 mcg Oral Q0600   melatonin  6 mg Oral QHS   metoprolol succinate  25 mg Oral Daily  pantoprazole  40 mg Oral Daily   potassium chloride  20 mEq Oral TID   pravastatin  40 mg Oral Daily   spironolactone  25 mg Oral Daily   Continuous Infusions:  sodium chloride 10 mL/hr at 04/01/23 1230     LOS: 0 days    Time spent: 35 minutes    Jenkins Risdon Hoover Brunette, DO [facility]  If 7PM-7AM, please contact night-coverage [email or web address] 04/02/2023, 9:39 AM

## 2023-04-02 NOTE — Anesthesia Procedure Notes (Signed)
Procedure Name: Intubation Date/Time: 04/02/2023 2:18 PM  Performed by: Julian Reil, CRNAPre-anesthesia Checklist: Patient identified, Emergency Drugs available, Suction available and Patient being monitored Patient Re-evaluated:Patient Re-evaluated prior to induction Oxygen Delivery Method: Circle system utilized Preoxygenation: Pre-oxygenation with 100% oxygen Induction Type: IV induction Ventilation: Mask ventilation without difficulty and Oral airway inserted - appropriate to patient size Laryngoscope Size: Glidescope and 3 Grade View: Grade I Tube type: Oral Tube size: 7.5 mm Number of attempts: 2 Airway Equipment and Method: Stylet, Oral airway and Video-laryngoscopy Placement Confirmation: ETT inserted through vocal cords under direct vision, positive ETCO2 and breath sounds checked- equal and bilateral Secured at: 23 cm Tube secured with: Tape Dental Injury: Teeth and Oropharynx as per pre-operative assessment  Comments: 1st DL Miller 3 grade 4 view, 2nd DL Glidescope 3 as noted above.

## 2023-04-02 NOTE — H&P (Deleted)
History and Physical Interval Note:   04/02/2023 8:16 AM   Andrew Oconnor has presented today for surgery, with the diagnosis of ACUTE CHOLECYSTITIS. The various methods of treatment have been discussed with the patient and family. After consideration of risks, benefits and other options for treatment, the patient has consented to XI ROBOTIC ASSISTED LAPAROSCOPIC CHOLECYSTECTOMY. The patient's history has been reviewed, patient examined, no change in status, stable for surgery.  I have reviewed the patient's chart and labs. Mild degree of hypoglycemia this AM; D5 fluids were started and insulin pump turned off given NPO status and surgery being scheduled for the afternoon. Questions were answered to the patient's satisfaction.       Milas Hock

## 2023-04-02 NOTE — Anesthesia Preprocedure Evaluation (Signed)
Anesthesia Evaluation  Patient identified by MRN, date of birth, ID band Patient awake    Reviewed: Allergy & Precautions, H&P , NPO status , Patient's Chart, lab work & pertinent test results, reviewed documented beta blocker date and time   Airway Mallampati: II  TM Distance: >3 FB Neck ROM: full    Dental no notable dental hx.    Pulmonary neg pulmonary ROS, sleep apnea , former smoker   Pulmonary exam normal breath sounds clear to auscultation       Cardiovascular Exercise Tolerance: Good hypertension, + angina  +CHF  negative cardio ROS  Rhythm:regular Rate:Normal     Neuro/Psych  PSYCHIATRIC DISORDERS Anxiety Depression     Neuromuscular disease negative neurological ROS  negative psych ROS   GI/Hepatic negative GI ROS, Neg liver ROS, hiatal hernia,GERD  ,,  Endo/Other  negative endocrine ROSdiabetesHypothyroidism    Renal/GU negative Renal ROS  negative genitourinary   Musculoskeletal   Abdominal   Peds  Hematology negative hematology ROS (+)   Anesthesia Other Findings   Reproductive/Obstetrics negative OB ROS                             Anesthesia Physical Anesthesia Plan  ASA: 3  Anesthesia Plan: General and General ETT   Post-op Pain Management:    Induction:   PONV Risk Score and Plan: Ondansetron and Scopolamine patch - Pre-op  Airway Management Planned:   Additional Equipment:   Intra-op Plan:   Post-operative Plan:   Informed Consent: I have reviewed the patients History and Physical, chart, labs and discussed the procedure including the risks, benefits and alternatives for the proposed anesthesia with the patient or authorized representative who has indicated his/her understanding and acceptance.     Dental Advisory Given  Plan Discussed with: CRNA  Anesthesia Plan Comments:        Anesthesia Quick Evaluation

## 2023-04-02 NOTE — Progress Notes (Signed)
Dr.Shah notified that order to start D5NS at 75ml  and d/c insulin pump had not been entered by Dr.Jenkins. (fluids have been infusing) Also that pt's blood sugar in 200's.  Verbal to stop D5NS and see orders for insulin.

## 2023-04-02 NOTE — Op Note (Signed)
Patient:  Andrew Oconnor  DOB:  Mar 30, 1956  MRN:  [id number]   Preop Diagnosis: Acute cholecystitis, cholelithiasis  Postop Diagnosis: Same  Procedure: Robotic assisted laparoscopic cholecystectomy  Surgeon: Franky Macho, MD  Anes: General endotracheal  Indications: Patient is a 67 year old white male who presents with acute cholecystitis secondary to cholelithiasis.  The risks and benefits of the procedure including bleeding, infection, hepatobiliary injury, and the possibility of an open procedure were fully explained to the patient, who gave informed consent.  Procedure note: The patient was placed in supine position.  After induction of general endotracheal anesthesia, the abdomen was prepped and draped using usual sterile technique with ChloraPrep.  Surgical site confirmation was performed.  An infraumbilical incision was made down to the fascia.  A Veress needle was introduced into the abdominal cavity and confirmation of placement was done using the saline drop test.  The abdomen was then insufflated to 15 mmHg pressure.  An 8 mm trocar was introduced into the abdominal cavity under direct visualization without difficulty.  The patient was placed in reverse Trendelenburg position and additional 8 mm trocar was placed in the left upper quadrant region, right lower abdomen, and right flank regions.  The liver was inspected and noted within normal limits.  The gallbladder was retracted in a dynamic fashion in order to provide a critical view of the triangle of Calot.  The gallbladder wall was very thin.  The cystic duct was first identified.  Its juncture to the infundibulum was fully identified.  This was confirmed by firefly.  Hem-o-lok clips were placed proximally distally on the cystic duct and the cystic duct was divided.  This was likewise done and cystic artery.  He was also noted to have an accessory duct of Luschka.  This was ligated using the Humalog.  The gallbladder was then  freed away from the gallbladder fossa using Bovie electrocautery.  The gallbladder ultimately was delivered through the left upper quadrant trocar site using an Endo Catch bag.  The gallbladder fossa was copiously irrigated with normal saline.  All fluid and air were then evacuated from the abdominal cavity prior to removal of the trocars.  All wounds were irrigated with normal saline.  All wounds were injected with point 5% Sensorcaine.  All incisions were closed using a 4-0 Monocryl subcuticular suture.  Dermabond was applied.  All tape and needle counts were correct at the end of the procedure.  The patient was extubated in the operating room and transferred to PACU in stable condition.  Complications: None  EBL: Minimal  Specimen: Gallbladder

## 2023-04-02 NOTE — Interval H&P Note (Signed)
History and Physical Interval Note:  04/02/2023 1:42 PM  Andrew Oconnor  has presented today for surgery, with the diagnosis of cholelithiasis.  The various methods of treatment have been discussed with the patient and family. After consideration of risks, benefits and other options for treatment, the patient has consented to  Procedure(s): XI ROBOTIC ASSISTED LAPAROSCOPIC CHOLECYSTECTOMY (N/A) as a surgical intervention.  The patient's history has been reviewed, patient examined, no change in status, stable for surgery.  I have reviewed the patient's chart and labs.  Questions were answered to the patient's satisfaction.     Franky Macho

## 2023-04-02 NOTE — Progress Notes (Signed)
Report received from PACU, Per report Dr.Kiel ok'd [email or web address] to floor with BP 197/92 (121)  Pt's BP has been elevated, see flowsheet for specifics.   Dr.Shaw sent a chat message regarding pt.being sent to floor and BP elevated.

## 2023-04-02 NOTE — Progress Notes (Addendum)
Regaldo and Dr.Jenkins aware of pt's Blood sugars being low and ok with pt.having hard candy.   RN asking if they want pt's insulin pump off and to start D5 fluids. Dr.Jenkins ok with turning off pump and fluids to be ordered.   Verbal order to have pt.stop insulin pump and start D5NS at 75ml / Regaldo working with Surgeon to ensure orders are placed. Fluids were started at time of verbal order and pump d/c by pt.at same time

## 2023-04-03 DIAGNOSIS — K810 Acute cholecystitis: Secondary | ICD-10-CM | POA: Diagnosis not present

## 2023-04-03 LAB — COMPREHENSIVE METABOLIC PANEL
ALT: 26 U/L (ref 0–44)
AST: 40 U/L (ref 15–41)
Albumin: 3.7 g/dL (ref 3.5–5.0)
Alkaline Phosphatase: 106 U/L (ref 38–126)
Anion gap: 10 (ref 5–15)
BUN: 16 mg/dL (ref 8–23)
CO2: 22 mmol/L (ref 22–32)
Calcium: 8.7 mg/dL — ABNORMAL LOW (ref 8.9–10.3)
Chloride: 102 mmol/L (ref 98–111)
Creatinine, Ser: 0.93 mg/dL (ref 0.61–1.24)
GFR, Estimated: >60 mL/min (ref >60)
Glucose, Bld: 217 mg/dL — ABNORMAL HIGH (ref 70–99)
Potassium: 4.4 mmol/L (ref 3.5–5.1)
Sodium: 134 mmol/L — ABNORMAL LOW (ref 135–145)
Total Bilirubin: 2.2 mg/dL — ABNORMAL HIGH (ref 0.3–1.2)
Total Protein: 7.2 g/dL (ref 6.5–8.1)

## 2023-04-03 LAB — CBC
HCT: 45.8 % (ref 39.0–52.0)
Hemoglobin: 16 g/dL (ref 13.0–17.0)
MCHC: 34.9 g/dL (ref 30.0–36.0)
MCV: 90 fL (ref 80.0–100.0)
Platelets: 232 10*3/uL (ref 150–400)
RBC: 5.09 MIL/uL (ref 4.22–5.81)
RDW: 13.2 % (ref 11.5–15.5)
WBC: 12 10*3/uL — ABNORMAL HIGH (ref 4.0–10.5)
nRBC: 0 % (ref 0.0–0.2)

## 2023-04-03 LAB — GLUCOSE, CAPILLARY
Glucose-Capillary: 241 mg/dL — ABNORMAL HIGH (ref 70–99)
Glucose-Capillary: 212 mg/dL — ABNORMAL HIGH (ref 70–99)

## 2023-04-03 LAB — MAGNESIUM: Magnesium: 1.8 mg/dL (ref 1.7–2.4)

## 2023-04-03 MED ORDER — SIMETHICONE 80 MG PO CHEW: 40.0000 mg | CHEWABLE_TABLET | Freq: Four times a day (QID) | ORAL | 0 refills | Status: DC | PRN

## 2023-04-03 MED ORDER — OXYCODONE HCL 5 MG PO TABS: 5.0000 mg | ORAL_TABLET | ORAL | 0 refills | Status: DC | PRN

## 2023-04-03 NOTE — Inpatient Diabetes Management (Signed)
Inpatient Diabetes Program Recommendations  AACE/ADA: New Consensus Statement on Inpatient Glycemic Control   Target Ranges:  Prepandial:   less than 140 mg/dL      Peak postprandial:   less than 180 mg/dL (1-2 hours)      Critically ill patients:  140 - 180 mg/dL    Latest Reference Range & Units 04/03/23 03:33 04/03/23 07:52  Glucose-Capillary 70 - 99 mg/dL 563 (H) 875 (H)    Latest Reference Range & Units 04/02/23 07:30 04/02/23 09:04 04/02/23 11:08 04/02/23 12:36 04/02/23 15:49 04/02/23 16:40 04/02/23 20:20 04/02/23 23:58  Glucose-Capillary 70 - 99 mg/dL 70 97 643 (H) 329 (H) 518 (H) 230 (H) 294 (H) 272 (H)   Review of Glycemic Control  Diabetes history: DM1 Outpatient Diabetes medications: T-Slim insulin pump with Novolog with Dexcom G7 CGM Current orders for Inpatient glycemic control: Novolog 0-15 units Q4H  Inpatient Diabetes Program Recommendations:    Insulin Pump: Please allow patient to restart his insulin pump and use the Insulin Pump order set to order CBGs and insulin pump AC&HS and 2am. Please discontinue Novolog correction scale if insulin pump is restarted.  NOTE: Noted CBGs in 200's over the past 18 hours. Spoke with patient over the phone. Patient reports he was told to remove his insulin pump yesterday prior to surgery and he has not been allowed to resume it yet. Informed patient that he received Decadron 5 mg at 14:34 on 04/02/23 which is contributing to hyperglycemia. Discussed that he is only ordered Novolog correction insulin currently and he has not been given any basal insulin which is also contributing to hyperglycemia. Patient states he has everything he needs to resume his insulin pump. Informed patient I would reach out to Dr. Sherryll Burger and see if patient can resume his insulin pump this morning. Patient is eager to resume his insulin pump and appreciative of call. Sent chat message to Dr. Sherryll Burger at 8:00 am to ask if patient could resume his insulin pump; awaiting  reply.  Thanks, Orlando Penner, RN, MSN, CDCES Diabetes Coordinator Inpatient Diabetes Program [phone] (Team Pager from 8am to 5pm)

## 2023-04-03 NOTE — Progress Notes (Addendum)
1 Day Post-Op  Subjective: Patient is doing well this AM with pain well-controlled. He has been passing gas, no BM as of yet. Has been able to eat part of his meals without N/V.  Objective: Vital signs in last 24 hours: Temp:  [97.3 F (36.3 C)-98.5 F (36.9 C)] 97.9 F (36.6 C) (07/24 2359) Pulse Rate:  [57-79] 68 (07/24 2359) Resp:  [10-20] 18 (07/24 2359) BP: (126-210)/(55-100) 126/63 (07/24 2359) SpO2:  [88 %-100 %] 96 % (07/24 2359) Weight:  [67.1 kg] 67.1 kg (07/24 1237) Last BM Date : 04/01/23  Intake/Output from previous day: 07/24 0701 - 07/25 0700 In: 2240 [P.O.:480; I.V.:1660; IV Piggyback:100] Out: 1005 [Urine:1000; Blood:5] Intake/Output this shift: No intake/output data recorded.  General: pleasant and well-appearing CV: RRR, no murmurs Pulm: CTAB Abdominal: Soft, nondistended. No TTP, no rebound or guarding. Incisions c/d/I with dermabond in place. Back: 4 cm round, freely mobile mass palpated to the right of the thoracic spine Psych: A&O x3   Lab Results:  Recent Labs    04/01/23 0405 04/03/23 0312  WBC 8.6 12.0*  HGB 14.9 16.0  HCT 42.3 45.8  PLT 197 232   BMET Recent Labs    04/02/23 0351 04/03/23 0312  NA 138 134*  K 3.9 4.4  CL 106 102  CO2 24 22  GLUCOSE 133* 217*  BUN 16 16  CREATININE 0.81 0.93  CALCIUM 8.5* 8.7*   PT/INR No results for input(s): "LABPROT", "INR" in the last 72 hours.  Studies/Results: No results found.  Anti-infectives: Anti-infectives (From admission, onward)    Start     Dose/Rate Route Frequency Ordered Stop   04/03/23 0600  ceFAZolin (ANCEF) IVPB 2g/100 mL premix        2 g 200 mL/hr over 30 Minutes Intravenous On call to O.R. 04/02/23 1235 04/02/23 1430   04/02/23 1233  ceFAZolin (ANCEF) 2-4 GM/100ML-% IVPB       Note to Pharmacy: Montel Clock E: cabinet override      04/02/23 1233 04/02/23 1426       Assessment/Plan: s/p Procedure(s): XI ROBOTIC ASSISTED LAPAROSCOPIC  CHOLECYSTECTOMY  Patient is 67 year old male s/p robotic assisted cholecystectomy on 7/24. He is doing well today with pain well controlled. Is endorsing passing flatus. Post-operative leukocytosis, NTD. No concerns at this moment. Discharge per hospitalist.   - restart insulin pump - prn pain and nausea control - simethicone prn   LOS: 0 days    Dominica Severin 04/03/2023

## 2023-04-03 NOTE — Discharge Summary (Signed)
Physician Discharge Summary  Andrew Oconnor NWG:[id number] DOB: 01/09/1956 DOA: 03/30/2023  PCP: [facility]  Admit date: 03/30/2023  Discharge date: 04/03/2023  Admitted From:Home  Disposition:  Home  Recommendations for Outpatient Follow-up:  Follow up with PCP in 1-2 weeks Follow-up with Dr. Lovell Sheehan as scheduled Pain medications prescribed as needed Continue home insulin pump as well as other home medications  Home Health: None  Equipment/Devices: None  Discharge Condition:Stable  CODE STATUS: Full  Diet recommendation: Heart Healthy/carb modified  Brief/Interim Summary: 67 y.o. male with medical history significant of CHF, diabetes mellitus type 2, GERD, hyperlipidemia, restless leg syndrome, hypertension, thyroid disorder, and more presents to the ED with a chief complaint of achy right upper quadrant pain.  He was admitted for acute cholecystitis and underwent robotic assisted laparoscopic cholecystectomy on 7/24.  He tolerated this well and was noted to have some blood pressure elevations afterwards.  His blood pressures are now stable and he is tolerating diet and is in stable condition for discharge with follow-up as arranged.  No other acute events or concerns noted.  Discharge Diagnoses:  Principal Problem:   Acute cholecystitis Active Problems:   Diabetes mellitus type 2, controlled (HCC)   Hypothyroidism   Hyperlipidemia   Hypertension   Restless leg   Hypokalemia  Principal discharge diagnosis: Acute cholecystitis status post laparoscopic cholecystectomy 7/24.  Discharge Instructions  Discharge Instructions     Diet - low sodium heart healthy   Complete by: As directed    Diet - low sodium heart healthy   Complete by: As directed    Increase activity slowly   Complete by: As directed    Increase activity slowly   Complete by: As directed       Allergies as of 04/03/2023       Reactions   Clonazepam Other (See  Comments)   "It makes him go crazy"   Donepezil Other (See Comments)   Gabapentin    Other Reaction(s): Other (See Comments) Mood swing   Ropinirole    Ropinirole Hcl Other (See Comments)        Medication List     TAKE these medications    aspirin EC 81 MG tablet Take 1 tablet (81 mg total) by mouth daily. Swallow whole.   benazepril 10 MG tablet Commonly known as: LOTENSIN Take 0.5 tablets (5 mg total) by mouth daily. Take 1/2 tablet by mouth daily. What changed:  how much to take additional instructions   carbidopa-levodopa 25-100 MG tablet Commonly known as: SINEMET IR Take 3 tablets by mouth at bedtime.   clonazePAM 0.5 MG tablet Commonly known as: KLONOPIN Take 0.5 mg by mouth daily as needed for anxiety.   HYDROcodone-acetaminophen 5-325 MG tablet Commonly known as: Norco Take 1 tablet by mouth every 4 (four) hours as needed for moderate pain.   levothyroxine 150 MCG tablet Commonly known as: SYNTHROID Take 125 mcg by mouth daily before breakfast.   metoprolol succinate 25 MG 24 hr tablet Commonly known as: TOPROL-XL Take 1 tablet (25 mg total) by mouth daily.   NovoLOG 100 UNIT/ML injection Generic drug: insulin aspart Inject 80-120 Units into the skin daily. Pt states it usually runs 80-84, but can use up to 120   omeprazole 40 MG capsule Commonly known as: PRILOSEC Take 40 mg by mouth daily.   OT ULTRA/FASTTK CNTRL SOLN Soln   pravastatin 40 MG tablet Commonly known as: PRAVACHOL Take 40 mg by mouth daily.   simethicone  80 MG chewable tablet Commonly known as: MYLICON Chew 0.5 tablets (40 mg total) by mouth every 6 (six) hours as needed for flatulence (bloating).   spironolactone 25 MG tablet Commonly known as: ALDACTONE Take 1 tablet (25 mg total) by mouth daily.        Follow-up Information     Franky Macho, MD Follow up.   Specialty: General Surgery Why: As needed.  Will call you in two weeks for follow up. Contact  information: [address] [place] [address] [phone]         [facility]. Schedule an appointment as soon as possible for a visit in 1 week(s).   Specialty: Family Medicine Contact information: [address] [place] [address] [phone]                Allergies  Allergen Reactions   Clonazepam Other (See Comments)    "It makes him go crazy"   Donepezil Other (See Comments)   Gabapentin     Other Reaction(s): Other (See Comments)  Mood swing   Ropinirole    Ropinirole Hcl Other (See Comments)    Consultations: General surgery   Procedures/Studies: ECHOCARDIOGRAM COMPLETE  Result Date: 03/31/2023    ECHOCARDIOGRAM REPORT   Patient Name:   Andrew Oconnor Date of Exam: 03/31/2023 Medical Rec #:  [id number]        Height:       55.0 in Accession #:    [id number]       Weight:       148.0 lb Date of Birth:  09/21/1955        BSA:          1.542 m Patient Age:    66 years         BP:           131/65 mmHg Patient Gender: M                HR:           69 bpm. [facility] Procedure: 2D Echo, Cardiac Doppler and Color Doppler Indications:    Z01.818 Encounter for other preprocedural examination  History:        Patient has prior history of Echocardiogram examinations, most                 recent 11/25/2014. Signs/Symptoms:Chest Pain and Dyspnea; Risk                 Factors:Diabetes and Dyslipidemia. ETOH.  Sonographer:    Sheralyn Boatman RDCS Referring Phys: [phone] [facility] L [facility] IMPRESSIONS  1. Left ventricular ejection fraction, by estimation, is 65 to 70%. The left ventricle has normal function. The left ventricle has no regional wall motion abnormalities. Left ventricular diastolic parameters are consistent with Grade I diastolic dysfunction (impaired relaxation).  2. Right ventricular systolic function is normal. The right ventricular size is normal.  3. The mitral valve is grossly normal. Mild mitral  valve regurgitation.  4. The aortic valve is tricuspid. Aortic valve regurgitation is not visualized. Aortic valve sclerosis is present, with no evidence of aortic valve stenosis.  5. The inferior vena cava is dilated in size with <50% respiratory variability, suggesting right atrial pressure of 15 mmHg. FINDINGS  Left Ventricle: Left ventricular ejection fraction, by estimation, is 65 to 70%. The left ventricle has normal function. The left ventricle has no regional wall motion abnormalities. The left ventricular internal cavity  size was normal in size. There is  no left ventricular hypertrophy. Left ventricular diastolic parameters are consistent with Grade I diastolic dysfunction (impaired relaxation). Right Ventricle: The right ventricular size is normal. No increase in right ventricular wall thickness. Right ventricular systolic function is normal. Left Atrium: Left atrial size was normal in size. Right Atrium: Right atrial size was normal in size. Pericardium: There is no evidence of pericardial effusion. Mitral Valve: The mitral valve is grossly normal. Mild mitral valve regurgitation. Tricuspid Valve: The tricuspid valve is normal in structure. Tricuspid valve regurgitation is trivial. Aortic Valve: The aortic valve is tricuspid. Aortic valve regurgitation is not visualized. Aortic valve sclerosis is present, with no evidence of aortic valve stenosis. Pulmonic Valve: The pulmonic valve was grossly normal. Pulmonic valve regurgitation is not visualized. Aorta: The aortic root and ascending aorta are structurally normal, with no evidence of dilitation. Venous: The inferior vena cava is dilated in size with less than 50% respiratory variability, suggesting right atrial pressure of 15 mmHg. IAS/Shunts: No atrial level shunt detected by color flow Doppler.  LEFT VENTRICLE PLAX 2D LVIDd:         4.10 cm     Diastology LVIDs:         2.60 cm     LV e' medial:    7.07 cm/s LV PW:         1.00 cm     LV E/e' medial:   16.7 LV IVS:        1.00 cm     LV e' lateral:   7.83 cm/s LVOT diam:     2.00 cm     LV E/e' lateral: 15.1 LV SV:         78 LV SV Index:   50 LVOT Area:     3.14 cm  LV Volumes (MOD) LV vol d, MOD A2C: 64.0 ml LV vol d, MOD A4C: 73.9 ml LV vol s, MOD A2C: 18.9 ml LV vol s, MOD A4C: 21.3 ml LV SV MOD A2C:     45.1 ml LV SV MOD A4C:     73.9 ml LV SV MOD BP:      49.5 ml RIGHT VENTRICLE             IVC RV S prime:     10.20 cm/s  IVC diam: 2.00 cm TAPSE (M-mode): 2.1 cm LEFT ATRIUM             Index        RIGHT ATRIUM           Index LA diam:        3.50 cm 2.27 cm/m   RA Area:     10.50 cm LA Vol (A2C):   30.1 ml 19.52 ml/m  RA Volume:   19.30 ml  12.52 ml/m LA Vol (A4C):   36.6 ml 23.74 ml/m LA Biplane Vol: 35.9 ml 23.28 ml/m  AORTIC VALVE LVOT Vmax:   116.00 cm/s LVOT Vmean:  75.000 cm/s LVOT VTI:    0.247 m  AORTA Ao Root diam: 2.90 cm Ao Asc diam:  3.70 cm MITRAL VALVE MV Area (PHT): 4.31 cm     SHUNTS MV Decel Time: 176 msec     Systemic VTI:  0.25 m MV E velocity: 118.00 cm/s  Systemic Diam: 2.00 cm MV A velocity: 113.00 cm/s MV E/A ratio:  1.04 Dietrich Pates MD Electronically signed by Dietrich Pates MD Signature Date/Time: 03/31/2023/4:24:09 PM    Final  US Carotid Bilateral  Result Date: 03/31/2023 CLINICAL DATA:  67 year old male with right carotid bruit EXAM: BILATERAL CAROTID DUPLEX ULTRASOUND TECHNIQUE: Wallace Cullens scale imaging, color Doppler and duplex ultrasound were performed of bilateral carotid and vertebral arteries in the neck. COMPARISON:  None Available. FINDINGS: Criteria: Quantification of carotid stenosis is based on velocity parameters that correlate the residual internal carotid diameter with NASCET-based stenosis levels, using the diameter of the distal internal carotid lumen as the denominator for stenosis measurement. The following velocity measurements were obtained: RIGHT ICA:  Systolic 104 cm/sec, Diastolic 16 cm/sec CCA:  104 cm/sec SYSTOLIC ICA/CCA RATIO:  1.0 ECA:  175 cm/sec  LEFT ICA:  Systolic 92 cm/sec, Diastolic 20 cm/sec CCA:  123 cm/sec SYSTOLIC ICA/CCA RATIO:  0.7 ECA:  159 cm/sec Right Brachial SBP: Not acquired Left Brachial SBP: Not acquired RIGHT CAROTID ARTERY: No significant calcified disease of the right common carotid artery. Intermediate waveform maintained. Homogeneous plaque without significant calcifications at the right carotid bifurcation. Low resistance waveform of the right ICA. No significant tortuosity. RIGHT VERTEBRAL ARTERY: Antegrade flow with low resistance waveform. LEFT CAROTID ARTERY: No significant calcified disease of the left common carotid artery. Intermediate waveform maintained. Homogeneous plaque at the left carotid bifurcation without significant calcifications. Low resistance waveform of the left ICA. LEFT VERTEBRAL ARTERY:  Antegrade flow with low resistance waveform. IMPRESSION: Color duplex indicates minimal homogeneous plaque, with no hemodynamically significant stenosis by duplex criteria in the extracranial cerebrovascular circulation. Signed, Yvone Neu. Miachel Roux, RPVI [facility] Electronically Signed   By: Gilmer Mor D.O.   On: 03/31/2023 14:01   US Abdomen Limited RUQ (LIVER/GB)  Result Date: 03/31/2023 CLINICAL DATA:  6194 Acute cholecystitis 6194 EXAM: ULTRASOUND ABDOMEN LIMITED RIGHT UPPER QUADRANT COMPARISON:  03/30/2023 FINDINGS: Gallbladder: Abnormal gallbladder wall thickening measuring up to 7 mm. Layering sludge and small stones within the gallbladder. No sonographic Murphy sign noted by sonographer. Common bile duct: Diameter: 6 mm. Liver: 1.8 cm echogenic lesion within the subcapsular aspect of the left hepatic lobe compatible with hemangioma. No additional liver lesion. Mildly increased hepatic parenchymal echogenicity. Portal vein is patent on color Doppler imaging with normal direction of blood flow towards the liver. Other: None. IMPRESSION: 1.  Cholelithiasis with abnormal gallbladder wall thickening, suspicious for cholecystitis. No sonographic Eulah Pont sign was reported on exam. Correlate for pain medication administration. 2. Mildly increased hepatic parenchymal echogenicity, which is nonspecific but can be seen in the setting of hepatic steatosis. 3. Solitary 1.8 cm echogenic lesion within the left hepatic lobe compatible with hemangioma. Electronically Signed   By: Duanne Guess D.O.   On: 03/31/2023 13:00   MR ABDOMEN WITH MRCP W CONTRAST  Result Date: 03/31/2023 CLINICAL DATA:  Upper abdominal pain. Cholelithiasis. Indeterminate liver lesion on recent CT. EXAM: MRI ABDOMEN WITH CONTRAST (WITH MRCP) TECHNIQUE: Multiplanar multisequence MR imaging of the abdomen was performed following the administration of intravenous contrast. Heavily T2-weighted images of the biliary and pancreatic ducts were obtained, and three-dimensional MRCP images were rendered by post processing. CONTRAST:  7mL GADAVIST GADOBUTROL 1 MMOL/ML IV SOLN COMPARISON:  CT on 03/30/2023 FINDINGS: Lower chest: No acute findings. Hepatobiliary: A 1.9 cm subcapsular flash-filling hemangioma is seen anterior left hepatic lobe, corresponding with the liver lesion seen on recent CT. No other hepatic masses are identified. Multiple tiny gallstones are seen. Respiratory motion artifact causes image degradation. Gallbladder is mildly distended and shows mild diffuse wall thickening and contrast enhancement, suspicious for acute cholecystitis.  No evidence of biliary ductal dilatation. Pancreas:  No mass or inflammatory changes. Spleen:  Within normal limits in size and appearance. Adrenals/Urinary Tract: No suspicious masses identified. No evidence of hydronephrosis. Stomach/Bowel: Unremarkable. Vascular/Lymphatic: No pathologically enlarged lymph nodes identified. No acute vascular findings. Other:  None. Musculoskeletal:  No suspicious bone lesions identified. IMPRESSION: Cholelithiasis,  with findings suspicious for acute cholecystitis. No evidence of biliary ductal dilatation. 1.9 cm benign flash-filling hemangioma in anterior left hepatic lobe, corresponding with the liver lesion seen on recent CT. Electronically Signed   By: Danae Orleans M.D.   On: 03/31/2023 11:47   MR 3D Recon At Scanner  Result Date: 03/31/2023 CLINICAL DATA:  Upper abdominal pain. Cholelithiasis. Indeterminate liver lesion on recent CT. EXAM: MRI ABDOMEN WITH CONTRAST (WITH MRCP) TECHNIQUE: Multiplanar multisequence MR imaging of the abdomen was performed following the administration of intravenous contrast. Heavily T2-weighted images of the biliary and pancreatic ducts were obtained, and three-dimensional MRCP images were rendered by post processing. CONTRAST:  7mL GADAVIST GADOBUTROL 1 MMOL/ML IV SOLN COMPARISON:  CT on 03/30/2023 FINDINGS: Lower chest: No acute findings. Hepatobiliary: A 1.9 cm subcapsular flash-filling hemangioma is seen anterior left hepatic lobe, corresponding with the liver lesion seen on recent CT. No other hepatic masses are identified. Multiple tiny gallstones are seen. Respiratory motion artifact causes image degradation. Gallbladder is mildly distended and shows mild diffuse wall thickening and contrast enhancement, suspicious for acute cholecystitis. No evidence of biliary ductal dilatation. Pancreas:  No mass or inflammatory changes. Spleen:  Within normal limits in size and appearance. Adrenals/Urinary Tract: No suspicious masses identified. No evidence of hydronephrosis. Stomach/Bowel: Unremarkable. Vascular/Lymphatic: No pathologically enlarged lymph nodes identified. No acute vascular findings. Other:  None. Musculoskeletal:  No suspicious bone lesions identified. IMPRESSION: Cholelithiasis, with findings suspicious for acute cholecystitis. No evidence of biliary ductal dilatation. 1.9 cm benign flash-filling hemangioma in anterior left hepatic lobe, corresponding with the liver lesion  seen on recent CT. Electronically Signed   By: Danae Orleans M.D.   On: 03/31/2023 11:47   CT ABDOMEN PELVIS W CONTRAST  Result Date: 03/30/2023 CLINICAL DATA:  Epigastric pain. EXAM: CT ABDOMEN AND PELVIS WITH CONTRAST TECHNIQUE: Multidetector CT imaging of the abdomen and pelvis was performed using the standard protocol following bolus administration of intravenous contrast. RADIATION DOSE REDUCTION: This exam was performed according to the departmental dose-optimization program which includes automated exposure control, adjustment of the mA and/or kV according to patient size and/or use of iterative reconstruction technique. CONTRAST:  OMNIPAQUE IOHEXOL 300 MG/ML  SOLN COMPARISON:  None Available. FINDINGS: Lower chest: The visualized lung bases are clear. No intra-abdominal free air or free fluid. Hepatobiliary: There is a 1.3 cm hypoenhancing lesion in the left lobe of the liver which is not characterized on this CT but likely represents a flash filling hemangioma or a portal venous shunting. This likely corresponds to the hypoechoic lesion seen on the ultrasound of 04/21/2013. Further characterization with MRI without and with contrast on a nonemergent/outpatient basis recommended. No biliary dilatation. There multiple stones within the gallbladder. There is mild haziness of the gallbladder wall with trace pericholecystic fluid concerning for acute cholecystitis. Further evaluation with ultrasound is recommended. Pancreas: Unremarkable. No pancreatic ductal dilatation or surrounding inflammatory changes. Spleen: Normal in size without focal abnormality. Adrenals/Urinary Tract: The adrenal glands are unremarkable. The kidneys, visualized ureters, and urinary bladder appear unremarkable. Stomach/Bowel: There is scattered colonic diverticula without active inflammatory changes. There is enhancement of the gastric mucosa, likely physiologic. Gastritis  is less likely. Clinical correlation is recommended.  There is no bowel obstruction. The appendix is normal. Vascular/Lymphatic: Mild aortoiliac atherosclerotic disease. The IVC is unremarkable. No portal venous gas. There is no adenopathy. Reproductive: The prostate and seminal vesicles are grossly unremarkable. No pelvic mass. Other: Small fat containing bilateral inguinal hernias. Musculoskeletal: Degenerative changes of the spine. No acute osseous pathology. IMPRESSION: 1. Cholelithiasis with findings concerning for acute cholecystitis. Further evaluation with ultrasound is recommended. 2. Colonic diverticulosis. No bowel obstruction. Normal appendix. 3. A 1.3 cm hypoenhancing lesion in the left lobe of the liver, likely a flash filling hemangioma or a portal venous shunting. Further characterization with MRI without and with contrast on a nonemergent/outpatient basis recommended. 4.  Aortic Atherosclerosis (ICD10-I70.0). Electronically Signed   By: Elgie Collard M.D.   On: 03/30/2023 21:47     Discharge Exam: Vitals:   04/02/23 2126 04/02/23 2359  BP: 135/65 126/63  Pulse: 76 68  Resp: 20 18  Temp: 98.5 F (36.9 C) 97.9 F (36.6 C)  SpO2: 97% 96%   Vitals:   04/02/23 1656 04/02/23 1805 04/02/23 2126 04/02/23 2359  BP: (!) 149/67 (!) 137/55 135/65 126/63  Pulse: 70 68 76 68  Resp:   20 18  Temp:   98.5 F (36.9 C) 97.9 F (36.6 C)  TempSrc:   Oral   SpO2: 98% 99% 97% 96%  Weight:      Height:        General: Pt is alert, awake, not in acute distress Cardiovascular: RRR, S1/S2 +, no rubs, no gallops Respiratory: CTA bilaterally, no wheezing, no rhonchi Abdominal: Soft, NT, ND, bowel sounds + Extremities: no edema, no cyanosis    The results of significant diagnostics from this hospitalization (including imaging, microbiology, ancillary and laboratory) are listed below for reference.     Microbiology: Recent Results (from the past 240 hour(s))  Surgical pcr screen     Status: None   Collection Time: 04/02/23  2:31 AM    Specimen: Nasal Mucosa; Nasal Swab  Result Value Ref Range Status   MRSA, PCR NEGATIVE NEGATIVE Final   Staphylococcus aureus NEGATIVE NEGATIVE Final    Comment: (NOTE) The Xpert SA Assay (FDA approved for NASAL specimens in patients 47 years of age and older), is one component of a comprehensive surveillance program. It is not intended to diagnose infection nor to guide or monitor treatment. Performed at [facility], [address] [place], [place] [address]      Labs: BNP (last 3 results) No results for input(s): "BNP" in the last 8760 hours. Basic Metabolic Panel: Recent Labs  Lab 03/30/23 1940 03/31/23 0356 04/01/23 0405 04/02/23 0351 04/03/23 0312  NA 139 139 139 138 134*  K 3.4* 3.5 3.2* 3.9 4.4  CL 105 110 106 106 102  CO2 27 22 22 24 22   GLUCOSE 92 77 216* 133* 217*  BUN 11 12 13 16 16   CREATININE 0.88 0.78 0.89 0.81 0.93  CALCIUM 8.6* 8.0* 8.5* 8.5* 8.7*  MG  --  1.8 2.0  --  1.8   Liver Function Tests: Recent Labs  Lab 03/30/23 1940 03/31/23 0356 04/01/23 0405 04/03/23 0312  AST 52* 33 21 40  ALT 36 37 32 26  ALKPHOS 118 101 101 106  BILITOT 2.5* 2.0* 2.6* 2.2*  PROT 7.5 6.1* 6.4* 7.2  ALBUMIN 4.0 3.2* 3.4* 3.7   Recent Labs  Lab 03/30/23 1940  LIPASE 23   No results for input(s): "AMMONIA" in the last  168 hours. CBC: Recent Labs  Lab 03/30/23 1940 03/31/23 0356 04/01/23 0405 04/03/23 0312  WBC 9.9 10.7* 8.6 12.0*  NEUTROABS  --  7.1  --   --   HGB 17.0 14.5 14.9 16.0  HCT 49.1 41.0 42.3 45.8  MCV 89.9 88.9 89.4 90.0  PLT 242 202 197 232   Cardiac Enzymes: No results for input(s): "CKTOTAL", "CKMB", "CKMBINDEX", "TROPONINI" in the last 168 hours. BNP: Invalid input(s): "POCBNP" CBG: Recent Labs  Lab 04/02/23 1640 04/02/23 2020 04/02/23 2358 04/03/23 0333 04/03/23 0752  GLUCAP 230* 294* 272* 241* 212*   D-Dimer No results for input(s): "DDIMER" in the last 72 hours. Hgb A1c No results for input(s): "HGBA1C" in the last 72  hours. Lipid Profile No results for input(s): "CHOL", "HDL", "LDLCALC", "TRIG", "CHOLHDL", "LDLDIRECT" in the last 72 hours. Thyroid function studies No results for input(s): "TSH", "T4TOTAL", "T3FREE", "THYROIDAB" in the last 72 hours.  Invalid input(s): "FREET3" Anemia work up No results for input(s): "VITAMINB12", "FOLATE", "FERRITIN", "TIBC", "IRON", "RETICCTPCT" in the last 72 hours. Urinalysis    Component Value Date/Time   COLORURINE YELLOW 03/31/2023 0150   APPEARANCEUR CLEAR 03/31/2023 0150   LABSPEC 1.043 (H) 03/31/2023 0150   PHURINE 7.0 03/31/2023 0150   GLUCOSEU NEGATIVE 03/31/2023 0150   HGBUR NEGATIVE 03/31/2023 0150   BILIRUBINUR NEGATIVE 03/31/2023 0150   KETONESUR NEGATIVE 03/31/2023 0150   PROTEINUR NEGATIVE 03/31/2023 0150   NITRITE NEGATIVE 03/31/2023 0150   LEUKOCYTESUR NEGATIVE 03/31/2023 0150   Sepsis Labs Recent Labs  Lab 03/30/23 1940 03/31/23 0356 04/01/23 0405 04/03/23 0312  WBC 9.9 10.7* 8.6 12.0*   Microbiology Recent Results (from the past 240 hour(s))  Surgical pcr screen     Status: None   Collection Time: 04/02/23  2:31 AM   Specimen: Nasal Mucosa; Nasal Swab  Result Value Ref Range Status   MRSA, PCR NEGATIVE NEGATIVE Final   Staphylococcus aureus NEGATIVE NEGATIVE Final    Comment: (NOTE) The Xpert SA Assay (FDA approved for NASAL specimens in patients 68 years of age and older), is one component of a comprehensive surveillance program. It is not intended to diagnose infection nor to guide or monitor treatment. Performed at [facility], [address], [place], [place] [address]      Time coordinating discharge: 35 minutes  SIGNED:   Erick Blinks, DO [facility] 04/03/2023, 10:04 AM  If 7PM-7AM, please contact night-coverage [email or web address]

## 2023-04-08 NOTE — Anesthesia Postprocedure Evaluation (Signed)
Anesthesia Post Note  Patient: Andrew Oconnor  Procedure(s) Performed: XI ROBOTIC ASSISTED LAPAROSCOPIC CHOLECYSTECTOMY (Abdomen)  Patient location during evaluation: Phase II Anesthesia Type: General Level of consciousness: awake Pain management: pain level controlled Vital Signs Assessment: post-procedure vital signs reviewed and stable Respiratory status: spontaneous breathing and respiratory function stable Cardiovascular status: blood pressure returned to baseline and stable Postop Assessment: no headache and no apparent nausea or vomiting Anesthetic complications: no Comments: Late entry   No notable events documented.   Last Vitals:  Vitals:   04/02/23 2126 04/02/23 2359  BP: 135/65 126/63  Pulse: 76 68  Resp: 20 18  Temp: 36.9 C 36.6 C  SpO2: 97% 96%    Last Pain:  Vitals:   04/03/23 0840  TempSrc:   PainSc: 0-No pain                 Windell Norfolk

## 2023-04-15 ENCOUNTER — Encounter: Payer: Self-pay | Admitting: General Surgery

## 2023-04-15 ENCOUNTER — Ambulatory Visit: Payer: Medicare PPO | Admitting: General Surgery

## 2023-04-15 DIAGNOSIS — Z09 Encounter for follow-up examination after completed treatment for conditions other than malignant neoplasm: Secondary | ICD-10-CM

## 2023-04-15 DIAGNOSIS — K810 Acute cholecystitis: Secondary | ICD-10-CM

## 2023-04-15 NOTE — Progress Notes (Signed)
Subjective:     Andrew Oconnor  Postoperative virtual telephone visit performed with patient.  Patient was at home and I was in the office.  He states he is doing well.  He has no significant complaints.  His preoperative symptoms have resolved.  He denies any fever or chills. Objective:    There were no vitals taken for this visit.  General:  alert and cooperative  Final pathology consistent with diagnosis.     Assessment:    Doing well postoperatively.    Plan:   I told the patient that he he could increase his activity as able.  Follow-up with me as needed.  Total telephone time was 1-1/2 minutes.  As this was a part of the total global surgical fee, this was not a billable visit.

## 2023-05-23 ENCOUNTER — Other Ambulatory Visit: Payer: Self-pay | Admitting: Cardiology

## 2023-07-11 ENCOUNTER — Other Ambulatory Visit: Payer: Self-pay | Admitting: Medical Genetics

## 2023-07-11 DIAGNOSIS — Z006 Encounter for examination for normal comparison and control in clinical research program: Secondary | ICD-10-CM

## 2023-07-31 ENCOUNTER — Other Ambulatory Visit: Payer: Self-pay | Admitting: Cardiology

## 2023-11-07 ENCOUNTER — Ambulatory Visit: Payer: Medicare PPO | Admitting: Cardiology

## 2023-11-12 ENCOUNTER — Ambulatory Visit (AMBULATORY_SURGERY_CENTER): Payer: Medicare PPO

## 2023-11-12 ENCOUNTER — Telehealth: Payer: Self-pay

## 2023-11-12 VITALS — Ht <= 58 in | Wt 147.0 lb

## 2023-11-12 DIAGNOSIS — Z860100 Personal history of colon polyps, unspecified: Secondary | ICD-10-CM

## 2023-11-12 MED ORDER — NA SULFATE-K SULFATE-MG SULF 17.5-3.13-1.6 GM/177ML PO SOLN
1.0000 | Freq: Once | ORAL | 0 refills | Status: AC
Start: 2023-11-12 — End: 2023-11-12

## 2023-11-12 NOTE — Telephone Encounter (Signed)
 Hello,   This patient is on a insulin Pump. Scheduled for a colonoscopy with Dr. Marina Goodell on 12/03/23. Cans someone please reach out to the prescribing MD and obtain instructions.   Thank you, PV

## 2023-11-12 NOTE — Telephone Encounter (Signed)
 Letter sent electronically and faxed manually to Rueben Bash, PA-C at [facility] (phone [phone], fax [phone]) . Will await response.

## 2023-11-12 NOTE — Progress Notes (Signed)

## 2023-11-26 NOTE — Telephone Encounter (Signed)
 I have not heard back. He was scheduled to see his endocrinologist yesterday and was awaiting that appointment. I have called again today (phone [phone]) to request an update/clearance. I am advised that Ms. Rueben Bash nurse will reach back out to me.

## 2023-11-26 NOTE — Telephone Encounter (Signed)
 Good morning,   I am just following up on this patient. Have you heard anything back regarding insulin pump instructions? Colon is scheduled for next Wednesday.  Thank you

## 2023-11-27 ENCOUNTER — Encounter: Payer: Self-pay | Admitting: Internal Medicine

## 2023-11-28 NOTE — Telephone Encounter (Signed)
 I have spoken to patient to advise that per Rueben Bash, PA-C he should reduce his basal rate on insulin pump by 50 percent the day before the procedure and day of procedure. Patient verbalizes understanding. He states that he talked to Ms.Mayford Knife previously about this and was told he did not need to change anything. However, states he will follow the latest recommendation to reduce basal rate by 50% day before and day of procedure.

## 2023-11-28 NOTE — Telephone Encounter (Signed)
 Breejante Foye Clock, PA-C - 11/28/2023 2:26 PM EDT Formatting of this note is different from the original. No, I did not receive the fax. Let Dotty know that I would recommend that he decrease his basal rate by 50% the day before and the day of his procedure. Also, let her know that I instructed patient on how to decrease his basal rate by 50% for 48 hours (temp rate settings).  Basal rates changes noted below in bold. MN 1.8-->0.9 7 AM 2.2-->1.1 10 AM 2.4-->1.2 6 PM 2.85-->1.4  Electronically signed by Donnelly Stager, PA-C at 11/28/2023 2:37 PM EDT  Back to top of Progress Notes Rise Mu - 11/28/2023 1:38 PM EDT Formatting of this note might be different from the original. Dotty with [facility] calling to see if we received the fax.if not what is the advice for the insulin pump for someone that's one clear diet the entire day before the procedure and the morning of and NPO 3-4 hours prior .  Call back [phone] Electronically signed by Rise Mu at 11/28/2023 1:42 PM EDT  Back to top of Progress Notes Dellie Catholic, CMA - 11/26/2023 10:57 AM EDT Formatting of this note might be different from the original. Spoke w Dotty and gave her the correct fax number to fax over paperwork, wanting to know insulin pump settings for when doing colonoscopy. Electronically signed by Dellie Catholic, CMA at 11/26/2023 10:58 AM EDT  Back to top of Progress Notes Ricard Dillon - 11/26/2023 10:25 AM EDT Formatting of this note might be different from the original. Dotty from [facility] calling to speak to nurse about letter they sent  Call back [phone] Electronically signed by Ricard Dillon at 11/26/2023 10:27 AM EDT

## 2023-12-03 ENCOUNTER — Encounter: Payer: Self-pay | Admitting: Internal Medicine

## 2023-12-03 ENCOUNTER — Ambulatory Visit: Payer: Medicare PPO | Admitting: Internal Medicine

## 2023-12-03 VITALS — BP 107/55 | HR 62 | Temp 98.2°F | Resp 17 | Ht <= 58 in | Wt 147.0 lb

## 2023-12-03 DIAGNOSIS — D122 Benign neoplasm of ascending colon: Secondary | ICD-10-CM

## 2023-12-03 DIAGNOSIS — Z1211 Encounter for screening for malignant neoplasm of colon: Secondary | ICD-10-CM

## 2023-12-03 DIAGNOSIS — Z860100 Personal history of colon polyps, unspecified: Secondary | ICD-10-CM

## 2023-12-03 DIAGNOSIS — K5730 Diverticulosis of large intestine without perforation or abscess without bleeding: Secondary | ICD-10-CM

## 2023-12-03 DIAGNOSIS — Z860101 Personal history of adenomatous and serrated colon polyps: Secondary | ICD-10-CM

## 2023-12-03 DIAGNOSIS — K648 Other hemorrhoids: Secondary | ICD-10-CM | POA: Diagnosis not present

## 2023-12-03 MED ORDER — SODIUM CHLORIDE 0.9 % IV SOLN
500.0000 mL | Freq: Once | INTRAVENOUS | Status: DC
Start: 2023-12-03 — End: 2023-12-03

## 2023-12-03 NOTE — Patient Instructions (Signed)
   Handouts on polyps,diverticulosis,& hemorrhoids given to you today.   Await pathology results on polyps removed   Continue previous diet & medications  YOU HAD AN ENDOSCOPIC PROCEDURE TODAY AT THE [facility]:   Refer to the procedure report that was given to you for any specific questions about what was found during the examination.  If the procedure report does not answer your questions, please call your gastroenterologist to clarify.  If you requested that your care partner not be given the details of your procedure findings, then the procedure report has been included in a sealed envelope for you to review at your convenience later.  YOU SHOULD EXPECT: Some feelings of bloating in the abdomen. Passage of more gas than usual.  Walking can help get rid of the air that was put into your GI tract during the procedure and reduce the bloating. If you had a lower endoscopy (such as a colonoscopy or flexible sigmoidoscopy) you may notice spotting of blood in your stool or on the toilet paper. If you underwent a bowel prep for your procedure, you may not have a normal bowel movement for a few days.  Please Note:  You might notice some irritation and congestion in your nose or some drainage.  This is from the oxygen used during your procedure.  There is no need for concern and it should clear up in a day or so.  SYMPTOMS TO REPORT IMMEDIATELY:  Following lower endoscopy (colonoscopy or flexible sigmoidoscopy):  Excessive amounts of blood in the stool  Significant tenderness or worsening of abdominal pains  Swelling of the abdomen that is new, acute  Fever of 100F or higher    For urgent or emergent issues, a gastroenterologist can be reached at any hour by calling [phone]. Do not use MyChart messaging for urgent concerns.    DIET:  We do recommend a small meal at first, but then you may proceed to your regular diet.  Drink plenty of fluids but you should avoid alcoholic  beverages for 24 hours.  ACTIVITY:  You should plan to take it easy for the rest of today and you should NOT DRIVE or use heavy machinery until tomorrow (because of the sedation medicines used during the test).    FOLLOW UP: Our staff will call the number listed on your records the next business day following your procedure.  We will call around 7:15- 8:00 am to check on you and address any questions or concerns that you may have regarding the information given to you following your procedure. If we do not reach you, we will leave a message.     If any biopsies were taken you will be contacted by phone or by letter within the next 1-3 weeks.  Please call us at [phone] if you have not heard about the biopsies in 3 weeks.    SIGNATURES/CONFIDENTIALITY: You and/or your care partner have signed paperwork which will be entered into your electronic medical record.  These signatures attest to the fact that that the information above on your After Visit Summary has been reviewed and is understood.  Full responsibility of the confidentiality of this discharge information lies with you and/or your care-partner.

## 2023-12-03 NOTE — Op Note (Signed)
 [facility] Patient Name: Andrew Oconnor Procedure Date: 12/03/2023 2:30 PM MRN: [id number] Endoscopist: Wilhemina Bonito. Marina Goodell , MD, [id number] Age: 68 Referring MD:  Date of Birth: September 14, 1955 Gender: Male Account #: [id number] Procedure:                Colonoscopy with cold snare polypectomy x 1; biopsy                            polypectomy x 1 Indications:              High risk colon cancer surveillance: Personal                            history of multiple (3 or more) adenomas. Previous                            examinations 2007, 2014, 2020 Medicines:                Monitored Anesthesia Care Procedure:                Pre-Anesthesia Assessment:                           - Prior to the procedure, a History and Physical                            was performed, and patient medications and                            allergies were reviewed. The patient's tolerance of                            previous anesthesia was also reviewed. The risks                            and benefits of the procedure and the sedation                            options and risks were discussed with the patient.                            All questions were answered, and informed consent                            was obtained. Prior Anticoagulants: The patient has                            taken no anticoagulant or antiplatelet agents. ASA                            Grade Assessment: III - A patient with severe                            systemic disease. After reviewing the risks and  benefits, the patient was deemed in satisfactory                            condition to undergo the procedure.                           After obtaining informed consent, the colonoscope                            was passed under direct vision. Throughout the                            procedure, the patient's blood pressure, pulse, and                            oxygen saturations were  monitored continuously. The                            CF [id number] #[id number] was introduced through the anus                            and advanced to the the cecum, identified by                            appendiceal orifice and ileocecal valve. The                            ileocecal valve, appendiceal orifice, and rectum                            were photographed. The quality of the bowel                            preparation was excellent. The colonoscopy was                            performed without difficulty. The patient tolerated                            the procedure well. The bowel preparation used was                            SUPREP via split dose instruction. Scope In: 2:44:15 PM Scope Out: 2:57:20 PM Scope Withdrawal Time: 0 hours 9 minutes 5 seconds  Total Procedure Duration: 0 hours 13 minutes 5 seconds  Findings:                 A 4 mm polyp was found in the ascending colon. The                            polyp was removed with a cold snare. Resection was                            complete. No tissue retrieved.  A 1 mm polyp was found in the ascending colon. The                            polyp was removed with a jumbo cold forceps.                            Resection and retrieval were complete.                           Multiple diverticula were found in the left colon                            and right colon.                           Internal hemorrhoids were found during retroflexion.                           The exam was otherwise without abnormality on                            direct and retroflexion views. Complications:            No immediate complications. Estimated blood loss:                            None. Estimated Blood Loss:     Estimated blood loss: none. Impression:               - One 4 mm polyp in the ascending colon, removed                            with a cold snare. Resected .                            - One 1 mm polyp in the ascending colon, removed                            with a jumbo cold forceps. Resected and retrieved.                           - Diverticulosis in the left colon and in the right                            colon.                           - Internal hemorrhoids.                           - The examination was otherwise normal on direct                            and retroflexion views. Recommendation:           - Repeat colonoscopy in 5 years for  surveillance.                           - Patient has a contact number available for                            emergencies. The signs and symptoms of potential                            delayed complications were discussed with the                            patient. Return to normal activities tomorrow.                            Written discharge instructions were provided to the                            patient.                           - Resume previous diet.                           - Continue present medications.                           - Await pathology results. Wilhemina Bonito. Marina Goodell, MD 12/03/2023 3:02:39 PM This report has been signed electronically.

## 2023-12-03 NOTE — Progress Notes (Signed)
 HISTORY OF PRESENT ILLNESS:  Andrew Oconnor is a 68 y.o. male with a history of multiple adenomatous colon polyps.  Presents today for surveillance colonoscopy.  REVIEW OF SYSTEMS:  All non-GI ROS negative except for  Past Medical History:  Diagnosis Date   Allergy    Anxiety    Carpal tunnel syndrome    Cataract    CHF (congestive heart failure) (HCC)    Colon polyp    Depression    Diabetes mellitus without complication (HCC)    Erectile dysfunction    GERD (gastroesophageal reflux disease)    Hiatal hernia    History of diplopia     third nerve palsy 2013    Hyperlipidemia    Hypertension    Obesity    Sleep apnea    Sleep apnea with use of continuous positive airway pressure (CPAP)    diagnsed AHi of 20 in 2010 , titrated to 13 cm H20.   Thyroid disorder     Past Surgical History:  Procedure Laterality Date   CATARACT EXTRACTION W/PHACO Right 10/09/2015   Procedure: CATARACT EXTRACTION PHACO AND INTRAOCULAR LENS PLACEMENT (IOC);  Surgeon: Susa Simmonds, MD;  Location: [facility];  Service: Ophthalmology;  Laterality: Right;  CDE:2.17   HAND SURGERY Bilateral    secondary to nerve damage   hemorrectomy     LEFT HEART CATHETERIZATION WITH CORONARY ANGIOGRAM N/A 11/24/2014   Procedure: LEFT HEART CATHETERIZATION WITH CORONARY ANGIOGRAM;  Surgeon: Peter M Swaziland, MD;  Location: [facility] CATH LAB;  Service: Cardiovascular;  Laterality: N/A;   LESION REMOVAL N/A 05/04/2021   Procedure: EXCISION OF CHRONIC SUPRAPUBIC ABSCESS;  Surgeon: Gaynelle Adu, MD;  Location: [facility];  Service: General;  Laterality: N/A;    Social History Andrew Oconnor  reports that he quit smoking about 21 years ago. His smoking use included cigarettes. He has never used smokeless tobacco. He reports current alcohol use. He reports that he does not use drugs.  family history includes Cancer in his sister; Diabetes in his brother and father; Heart disease in his brother, father, maternal  grandfather, maternal grandmother, mother, paternal grandfather, paternal grandmother, and sister; Seizures in his father; Stroke in his father and another family member.  Allergies  Allergen Reactions   Clonazepam Other (See Comments)    "It makes him go crazy"   Donepezil Other (See Comments)    "Bad thoughts"   Gabapentin Other (See Comments)    Other Reaction(s): Other (See Comments)  Mood swing   Ropinirole Other (See Comments)    "bad thoughts"       PHYSICAL EXAMINATION: Vital signs: BP 131/65   Pulse (!) 54   Temp 98.2 F (36.8 C)   Ht 4\' 7"  (1.397 m)   Wt 147 lb (66.7 kg)   SpO2 98%   BMI 34.17 kg/m  General: Well-developed, well-nourished, no acute distress HEENT: Sclerae are anicteric, conjunctiva pink. Oral mucosa intact Lungs: Clear Heart: Regular Abdomen: soft, nontender, nondistended, no obvious ascites, no peritoneal signs, normal bowel sounds. No organomegaly. Extremities: No edema Psychiatric: alert and oriented x3. Cooperative     ASSESSMENT:  History of multiple adenomatous colon polyps   PLAN:   Surveillance colonoscopy

## 2023-12-03 NOTE — Progress Notes (Signed)
 1446 pt started coughing and spit out a mouth full of bile colored fluid and more passively came out of both nares.  HOB dropped to steep t-burg, suction started, sedation paused and o2 to 15L.  Pt allowed to wake up some but no more bouts of vomitting.  Sats never dropped and BS initially clear.  Co2 water trap was full but none in suction, some on pillow.  Sedation resumed

## 2023-12-04 ENCOUNTER — Telehealth: Payer: Self-pay | Admitting: *Deleted

## 2023-12-04 NOTE — Telephone Encounter (Signed)
 No answer on  follow up call. Left message.

## 2023-12-08 ENCOUNTER — Encounter: Payer: Self-pay | Admitting: Internal Medicine

## 2023-12-08 LAB — SURGICAL PATHOLOGY

## 2023-12-14 NOTE — Progress Notes (Unsigned)
 Cardiology Office Note    Date:  12/17/2023   ID:  Andrew Oconnor, DOB 06/25/1956, MRN [id number]  PCP:  [facility]  Cardiologist:  Dr. Swaziland  No chief complaint on file.   History of Present Illness:  Andrew Oconnor is a 68 y.o. 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  He had a cardiac catheterization on 11/24/2014 which demonstrated normal anatomy, normal LV function, normal EDP. Echocardiogram showed EF 55 to 60%, normal diastolic function.  Heart monitor obtained in May 2016 showed 7 beats run of NSVT.  This is controlled on beta-blocker.  He was seen in 2019 with chest pain. ETT was done and was normal.   He was admitted in July 2024 with acute cholecystitis. Underwent lap Chole. Echo at that time was normal.   On follow up today he is doing well.  Enjoys RV camping. Does his own yard work. Denies any chest pain, dyspnea or palpitations. Notes change in thyroid dose.    Past Medical History:  Diagnosis Date   Allergy    Anxiety    Carpal tunnel syndrome    Cataract    CHF (congestive heart failure) (HCC)    Colon polyp    Depression    Diabetes mellitus without complication (HCC)    Erectile dysfunction    GERD (gastroesophageal reflux disease)    Hiatal hernia    History of diplopia     third nerve palsy 2013    Hyperlipidemia    Hypertension    Obesity    Sleep apnea    Sleep apnea with use of continuous positive airway pressure (CPAP)    diagnsed AHi of 20 in 2010 , titrated to 13 cm H20.   Thyroid disorder     Past Surgical History:  Procedure Laterality Date   CATARACT EXTRACTION W/PHACO Right 10/09/2015   Procedure: CATARACT EXTRACTION PHACO AND INTRAOCULAR LENS PLACEMENT (IOC);  Surgeon: Susa Simmonds, MD;  Location: [facility];  Service: Ophthalmology;  Laterality: Right;  CDE:2.17   HAND SURGERY Bilateral    secondary to nerve damage   hemorrectomy     LEFT HEART  CATHETERIZATION WITH CORONARY ANGIOGRAM N/A 11/24/2014   Procedure: LEFT HEART CATHETERIZATION WITH CORONARY ANGIOGRAM;  Surgeon: Maryanna Stuber M [place], MD;  Location: [facility] CATH LAB;  Service: Cardiovascular;  Laterality: N/A;   LESION REMOVAL N/A 05/04/2021   Procedure: EXCISION OF CHRONIC SUPRAPUBIC ABSCESS;  Surgeon: Gaynelle Adu, MD;  Location: [facility];  Service: General;  Laterality: N/A;    Current Medications: Outpatient Medications Prior to Visit  Medication Sig Dispense Refill   aspirin EC 81 MG tablet Take 1 tablet (81 mg total) by mouth daily. Swallow whole. 90 tablet 3   benazepril (LOTENSIN) 10 MG tablet TAKE 0.5 TABLETS (5 MG TOTAL) BY MOUTH DAILY. TAKE 1/2 TABLET BY MOUTH DAILY. (Patient taking differently: Take 10 mg by mouth daily. Take 1/2 tablet by mouth daily.) 135 tablet 1   Blood Glucose Calibration (OT ULTRA/FASTTK CNTRL SOLN) SOLN      carbidopa-levodopa (SINEMET IR) 25-100 MG tablet Take 4 tablets by mouth at bedtime.     fexofenadine (ALLEGRA) 180 MG tablet Take by mouth.     levothyroxine (SYNTHROID) 112 MCG tablet Take 112 mcg by mouth daily.     NOVOLOG 100 UNIT/ML injection Inject 80-120 Units into the skin daily. Pt states it usually runs 80-84, but can use up to  120     omeprazole (PRILOSEC) 40 MG capsule Take 40 mg by mouth daily.  2   pravastatin (PRAVACHOL) 40 MG tablet Take 40 mg by mouth daily.      metoprolol succinate (TOPROL-XL) 25 MG 24 hr tablet Take 1 tablet (25 mg total) by mouth daily. 90 tablet 3   spironolactone (ALDACTONE) 25 MG tablet TAKE 1 TABLET (25 MG TOTAL) BY MOUTH DAILY. 90 tablet 3   No facility-administered medications prior to visit.     Allergies:   Clonazepam, Donepezil, Gabapentin, and Ropinirole   Social History   Socioeconomic History   Marital status: Married    Spouse name: Aram Beecham   Number of children: 0   Years of education: 12   Highest education level: Not on file  Occupational History   Occupation:  retired    Comment: Doctor, hospital  Tobacco Use   Smoking status: Former    Current packs/day: 0.00    Types: Cigarettes    Quit date: 09/09/2002    Years since quitting: 21.2   Smokeless tobacco: Never   Tobacco comments:    quit smoking 2004  Vaping Use   Vaping status: Never Used  Substance and Sexual Activity   Alcohol use: Yes    Comment: 1 drink daily   Drug use: No   Sexual activity: Not on file  Other Topics Concern   Not on file  Social History Narrative    may continue treatment of complex apnea with his current CPAP setting , good residual AHI of 3.3/   Has improved by Epworth and has only once a night  nocturia , less shortness of breath.   gel mask fits well acc. to air leak data but has left a pressure mark.    He may need an alternative mask until it's healed. I recommended a swift FX medium pillows .   DME [employer] - machine , HCS with Alinda Money,  RT.      Patient is married Aram Beecham) and lives at home with his wife and step-son.   Patient is retired.   Patient has a high school.   Patient is right-handed.   Patient drinks 1-2 cups of coffee every morning.            Social Drivers of Corporate investment banker Strain: Not on file  Food Insecurity: Low Risk  (09/15/2023)   Received from [facility]   Hunger Vital Sign    Worried About Running Out of Food in the Last Year: Never true    Ran Out of Food in the Last Year: Never true  Transportation Needs: No Transportation Needs (09/15/2023)   Received from [facility]    In the past 12 months, has lack of reliable transportation kept you from medical appointments, meetings, work or from getting things needed for daily living? : No  Physical Activity: Not on file  Stress: Not on file  Social Connections: Not on file     Family History:  The patient's family history includes Cancer in his sister; Diabetes in his brother and father; Heart disease in his brother, father, maternal  grandfather, maternal grandmother, mother, paternal grandfather, paternal grandmother, and sister; Seizures in his father; Stroke in his father and another family member.   ROS:   Please see the history of present illness.    ROS All other systems reviewed and are negative.   PHYSICAL EXAM:   VS:  BP (!) 108/54 (BP Location:  Right Arm, Patient Position: Sitting, Cuff Size: Normal)   Pulse (!) 56   Wt 149 lb 6.4 oz (67.8 kg)   SpO2 96%   BMI 34.72 kg/m    GEN: Well nourished, well developed, in no acute distress  HEENT: normal  Neck: no JVD, carotid bruits, or masses Cardiac: RRR; no murmurs, rubs, or gallops,no edema  Respiratory:  clear to auscultation bilaterally, normal work of breathing GI: soft, nontender, nondistended, + BS MS: no deformity or atrophy  Skin: warm and dry, no rash Neuro:  Alert and Oriented x 3, Strength and sensation are intact Psych: euthymic mood, full affect  Wt Readings from Last 3 Encounters:  12/17/23 149 lb 6.4 oz (67.8 kg)  12/03/23 147 lb (66.7 kg)  11/12/23 147 lb (66.7 kg)      Studies/Labs Reviewed:   EKG:  EKG is not ordered today.   Recent Labs: 04/03/2023: ALT 26; BUN 16; Creatinine, Ser 0.93; Hemoglobin 16.0; Magnesium 1.8; Platelets 232; Potassium 4.4; Sodium 134   Lipid Panel No results found for: "CHOL", "TRIG", "HDL", "CHOLHDL", "VLDL", "LDLCALC", "LDLDIRECT"  Dated 05/31/21: cholesterol 156, triglycerides 122, HDL 45, LDL 87. CBC and CMET normal Dated 09/07/21: A1c 6.2% Dated 09/25/22: A1c 5.8%.  Dated 09/19/22: normal CBC. Glucose 111, otherwise CMET normal. Cholesterol 150, triglycerides 117, HDL 45, LDL 84.   Additional studies/ records that were reviewed today include:   Cath 11/23/2016 Procedural Findings: Hemodynamics: AO 156/78 mean 114 mm Hg LV 166/32 mm Hg   Coronary angiography: Coronary dominance: left   Left mainstem:    Left anterior descending (LAD): Normal    Left circumflex (LCx): Normal   Right  coronary artery (RCA): Normal   Left ventriculography: Left ventricular systolic function is normal, LVEF is estimated at 55-65%, there is no significant mitral regurgitation. The proximal aorta is mildly dilated.    Final Conclusions:   1. Normal coronary anatomy. 2. Normal LV function 3. Elevated LV EDP   Echo 11/25/2014 LV EF: 55% -   60% Study Conclusions  - Left ventricle: The cavity size was normal. Systolic function was   normal. The estimated ejection fraction was in the range of 55%   to 60%. Wall motion was normal; there were no regional wall   motion abnormalities. Left ventricular diastolic function   parameters were normal. - Left atrium: The atrium was mildly dilated. - Atrial septum: No defect or patent foramen ovale was identified. - Pericardium, extracardiac: A trivial pericardial effusion was   identified posterior to the heart.   Event monitor 01/18/2015 Study Highlights   NSR and sinus tachycardia 7 beat run of NSVT Symptoms of skipped beats and tachycardia do not correlate with arrythmia.   1. Normal sinus rhythm 2. 7 beat run NSVT 3. Symptoms do not correlate with arrhythmia.   ETT 07/14/18: Stress Findings  Stress Findings The patient exercised following the Bruce protocol.   The patient reported shortness of breath during the stress test. The patient experienced no angina during the stress test.   The test was stopped because  the patient complained of fatigue and shortness of breath.   Heart rate demonstrated a normal response to exercise. Blood pressure demonstrated a hypertensive response to exercise. Overall, the patient's exercise capacity was normal.   85% of maximum heart rate was achieved after 6.5 minutes.  Recovery time:  5 minutes.   Duke Treadmill Score: low risk The patient's response to exercise was adequate for diagnosis.   Echo 03/31/23: IMPRESSIONS  1. Left ventricular ejection fraction, by estimation, is 65 to 70%. The   left ventricle has normal function. The left ventricle has no regional  wall motion abnormalities. Left ventricular diastolic parameters are  consistent with Grade I diastolic  dysfunction (impaired relaxation).   2. Right ventricular systolic function is normal. The right ventricular  size is normal.   3. The mitral valve is grossly normal. Mild mitral valve regurgitation.   4. The aortic valve is tricuspid. Aortic valve regurgitation is not  visualized. Aortic valve sclerosis is present, with no evidence of aortic  valve stenosis.   5. The inferior vena cava is dilated in size with <50% respiratory  variability, suggesting right atrial pressure of 15 mmHg.   ASSESSMENT:    No diagnosis found.    PLAN:  In order of problems listed above:  Palpitation: minor arrhythmia on monitor in 2016. Has been on low dose Toprol but doubt this is really needed. Will discontinue  Hypertension: well controlled.Continue benazepril and aldactone  Hyperlipidemia: On pravastatin 40 mg. LDL 63  Hypothyroidism: Managed by primary care provider     Medication Adjustments/Labs and Tests Ordered: Current medicines are reviewed at length with the patient today.  Concerns regarding medicines are outlined above.  Medication changes, Labs and Tests ordered today are listed in the Patient Instructions below. There are no Patient Instructions on file for this visit.    Signed, Dacotah Cabello Sw[place], MD  12/17/2023 3:35 PM    [facility] [address] [place], [place], [place]  [address] Phone: [phone]; Fax: [phone]

## 2023-12-17 ENCOUNTER — Encounter: Payer: Self-pay | Admitting: Cardiology

## 2023-12-17 ENCOUNTER — Ambulatory Visit: Payer: Medicare PPO | Attending: Cardiology | Admitting: Cardiology

## 2023-12-17 VITALS — BP 108/54 | HR 56 | Wt 149.4 lb

## 2023-12-17 DIAGNOSIS — E785 Hyperlipidemia, unspecified: Secondary | ICD-10-CM

## 2023-12-17 DIAGNOSIS — I10 Essential (primary) hypertension: Secondary | ICD-10-CM

## 2023-12-17 NOTE — Patient Instructions (Signed)
 Medication Instructions:  Stop Toprol  Continue all other medications *If you need a refill on your cardiac medications before your next appointment, please call your pharmacy*  Lab Work: None ordered  Testing/Procedures: None ordered  Follow-Up: At [facility], you and your health needs are our priority.  As part of our continuing mission to provide you with exceptional heart care, our providers are all part of one team.  This team includes your primary Cardiologist (physician) and Advanced Practice Providers or APPs (Physician Assistants and Nurse Practitioners) who all work together to provide you with the care you need, when you need it.  Your next appointment:  1 year    Call in Dec to schedule April appointment     Provider:  Dr.Jordan   We recommend signing up for the patient portal called "MyChart".  Sign up information is provided on this After Visit Summary.  MyChart is used to connect with patients for Virtual Visits (Telemedicine).  Patients are able to view lab/test results, encounter notes, upcoming appointments, etc.  Non-urgent messages can be sent to your provider as well.   To learn more about what you can do with MyChart, go to [email or web address].         1st Floor: - Lobby - Registration  - Pharmacy  - Lab - Cafe  2nd Floor: - PV Lab - Diagnostic Testing (echo, CT, nuclear med)  3rd Floor: - Vacant  4th Floor: - TCTS (cardiothoracic surgery) - AFib Clinic - Structural Heart Clinic - Vascular Surgery  - Vascular Ultrasound  5th Floor: - HeartCare Cardiology (general and EP) - Clinical Pharmacy for coumadin, hypertension, lipid, weight-loss medications, and med management appointments    Valet parking services will be available as well.

## 2024-02-12 ENCOUNTER — Other Ambulatory Visit: Payer: Self-pay | Admitting: Cardiology

## 2024-02-16 ENCOUNTER — Encounter: Payer: Self-pay | Admitting: Cardiology

## 2024-02-16 MED ORDER — SPIRONOLACTONE 25 MG PO TABS: 25.0000 mg | ORAL_TABLET | Freq: Every day | ORAL | 3 refills | Status: AC

## 2024-07-08 ENCOUNTER — Other Ambulatory Visit: Payer: Self-pay | Admitting: Medical Genetics

## 2024-07-08 DIAGNOSIS — Z006 Encounter for examination for normal comparison and control in clinical research program: Secondary | ICD-10-CM

## 2024-07-31 LAB — GENECONNECT MOLECULAR SCREEN

## 2024-08-02 ENCOUNTER — Telehealth: Payer: Self-pay | Admitting: Medical Genetics

## 2024-08-02 DIAGNOSIS — Z006 Encounter for examination for normal comparison and control in clinical research program: Secondary | ICD-10-CM

## 2024-08-09 NOTE — Telephone Encounter (Signed)
 [facility]  08/09/2024 3:42 PM  Confirmed I was speaking with Andrew Oconnor [id number] by using name and DOB. Informed participant the reason for this call is to follow-up on a recent sample the participant provided at one of the [facility] lab locations. Informed participant the test was not able to be completed with this sample and apologized for the inconvenience. Participant was requested to provide a new sample at one of our participating labs at no cost so that participant can continue participation and receive test results. Informed participant they do not need to be fasting and if there are other samples that need to be drawn, they can be done at the same visit. Participant has not had a blood transfusion or blood product in the last 30 days. Participant agreed to provide another sample. Participant was provided the [employer] program website to learn why this may have happened. Participant was thanked for their time and continued support of the above study.    Jordyn Pennstrom, BS [place]  [facility] Clinical Research Specialist II Direct Dial: [phone]  Fax: [phone]

## 2024-09-18 LAB — GENECONNECT MOLECULAR SCREEN: Genetic Analysis Overall Interpretation: NEGATIVE
